# Patient Record
Sex: Female | Born: 1950
Health system: Southern US, Community
[De-identification: ages and names within clinical notes are randomized; demographics above are authoritative.]

## PROBLEM LIST (undated history)

## (undated) DIAGNOSIS — S92909A Unspecified fracture of unspecified foot, initial encounter for closed fracture: Secondary | ICD-10-CM

## (undated) DIAGNOSIS — C801 Malignant (primary) neoplasm, unspecified: Secondary | ICD-10-CM

## (undated) DIAGNOSIS — M810 Age-related osteoporosis without current pathological fracture: Secondary | ICD-10-CM

## (undated) DIAGNOSIS — E039 Hypothyroidism, unspecified: Secondary | ICD-10-CM

## (undated) DIAGNOSIS — I10 Essential (primary) hypertension: Secondary | ICD-10-CM

## (undated) DIAGNOSIS — R569 Unspecified convulsions: Secondary | ICD-10-CM

## (undated) HISTORY — DX: Unspecified convulsions: R56.9

## (undated) HISTORY — DX: Age-related osteoporosis without current pathological fracture: M81.0

## (undated) HISTORY — DX: Hypothyroidism, unspecified: E03.9

## (undated) HISTORY — DX: Essential (primary) hypertension: I10

## (undated) HISTORY — DX: Malignant (primary) neoplasm, unspecified: C80.1

## (undated) HISTORY — DX: Unspecified fracture of unspecified foot, initial encounter for closed fracture: S92.909A

---

## 1983-07-18 HISTORY — PX: ANKLE SURGERY: SHX546

## 1993-07-17 HISTORY — PX: TUBAL LIGATION: SHX77

## 1999-02-20 ENCOUNTER — Emergency Department (HOSPITAL_COMMUNITY): Admission: EM | Admit: 1999-02-20 | Discharge: 1999-02-20 | Payer: Self-pay | Admitting: Emergency Medicine

## 2001-07-17 HISTORY — PX: COLON SURGERY: SHX602

## 2001-10-28 ENCOUNTER — Inpatient Hospital Stay (HOSPITAL_COMMUNITY): Admission: AD | Admit: 2001-10-28 | Discharge: 2001-11-04 | Payer: Self-pay | Admitting: Internal Medicine

## 2001-10-28 ENCOUNTER — Encounter (INDEPENDENT_AMBULATORY_CARE_PROVIDER_SITE_OTHER): Payer: Self-pay | Admitting: Specialist

## 2001-12-04 ENCOUNTER — Ambulatory Visit (HOSPITAL_COMMUNITY): Admission: RE | Admit: 2001-12-04 | Discharge: 2001-12-04 | Payer: Self-pay | Admitting: Oncology

## 2001-12-04 ENCOUNTER — Encounter: Payer: Self-pay | Admitting: Oncology

## 2001-12-05 ENCOUNTER — Ambulatory Visit (HOSPITAL_BASED_OUTPATIENT_CLINIC_OR_DEPARTMENT_OTHER): Admission: RE | Admit: 2001-12-05 | Discharge: 2001-12-05 | Payer: Self-pay | Admitting: General Surgery

## 2001-12-05 ENCOUNTER — Encounter: Payer: Self-pay | Admitting: General Surgery

## 2002-01-06 ENCOUNTER — Ambulatory Visit (HOSPITAL_COMMUNITY): Admission: RE | Admit: 2002-01-06 | Discharge: 2002-01-06 | Payer: Self-pay | Admitting: Oncology

## 2002-06-23 ENCOUNTER — Encounter: Admission: RE | Admit: 2002-06-23 | Discharge: 2002-06-23 | Payer: Self-pay | Admitting: Oncology

## 2002-06-23 ENCOUNTER — Encounter: Payer: Self-pay | Admitting: Oncology

## 2002-07-03 ENCOUNTER — Ambulatory Visit (HOSPITAL_BASED_OUTPATIENT_CLINIC_OR_DEPARTMENT_OTHER): Admission: RE | Admit: 2002-07-03 | Discharge: 2002-07-03 | Payer: Self-pay | Admitting: General Surgery

## 2002-11-07 ENCOUNTER — Ambulatory Visit (HOSPITAL_COMMUNITY): Admission: RE | Admit: 2002-11-07 | Discharge: 2002-11-07 | Payer: Self-pay | Admitting: Gastroenterology

## 2002-12-08 ENCOUNTER — Encounter: Payer: Self-pay | Admitting: Oncology

## 2002-12-08 ENCOUNTER — Encounter: Admission: RE | Admit: 2002-12-08 | Discharge: 2002-12-08 | Payer: Self-pay | Admitting: Oncology

## 2002-12-15 ENCOUNTER — Ambulatory Visit (HOSPITAL_COMMUNITY): Admission: RE | Admit: 2002-12-15 | Discharge: 2002-12-15 | Payer: Self-pay | Admitting: Oncology

## 2002-12-15 ENCOUNTER — Encounter: Payer: Self-pay | Admitting: Oncology

## 2003-04-05 ENCOUNTER — Encounter: Payer: Self-pay | Admitting: Emergency Medicine

## 2003-04-05 ENCOUNTER — Emergency Department (HOSPITAL_COMMUNITY): Admission: EM | Admit: 2003-04-05 | Discharge: 2003-04-05 | Payer: Self-pay | Admitting: Emergency Medicine

## 2003-06-05 ENCOUNTER — Encounter: Admission: RE | Admit: 2003-06-05 | Discharge: 2003-06-05 | Payer: Self-pay | Admitting: Oncology

## 2003-06-05 IMAGING — CT CT ABDOMEN W/ CM
1 series · 15 of 32 positions shown, 19 images · IV contrast (G+ & 100 ML OMNI 300)
Comparison: none

CLINICAL DATA: Colon CA status post surgery in [DATE].  Chemotherapy completed [DATE].  Con none. 
PA AND LATERAL CHEST
Comparison [DATE] from [REDACTED].  There are lower lung volumes.  Even allowing for this, there is now mild cardiac enlargement.  There is no evidence of mediastinal adenopathy.  Vascular congestion is present without edema, confluent air space opacity or pleural effusion.  Osseous structures appear stable. 
IMPRESSION
Mild cardiomegaly and vascular congestion. 
TECHNIQUE
Multidetector   helical CT imaging was performed during the IV bolus injection of [60] of Omnipaque 300.  Oral contrast was given.  Comparison study [DATE] from [REDACTED]. 
CT ABDOMEN W/CONTRAST
The lung bases are clear.  There is no pleural effusion.  There is a small hiatal hernia.  The liver appears normal without suspicious lesions.  The spleen, pancreas, adrenal glands, and gallbladder appear normal.  The kidneys have stable appearances with low attenuation lesions bilaterally compatible with cysts.  There is one projecting posteriorly from the mid left kidney which has nonspecific soft tissue attenuation, but has not changed from the prior study and is probably a small complicated cyst.  There is no adenopathy, ascites, or omental disease. 
No evidence of local recurrence or metastatic disease in the abdomen.  
Stable low attenuation renal lesions bilaterally. 
CT PELVIS W/CONTRAST
Post operative changes in the pelvis appear stable.  Multiple sigmoid diverticula are present without surrounding inflammatory change or fluid collection.  Small lymph nodes inferiorly in the mesentery appear stable, and no enlarged pelvic lymph nodes are seen.  The uterus and adnexa appear unchanged. 
Stable post operative CT of the pelvis.  No evidence for local recurrence or metastatic disease.

[Series 2: — · axial · 0.64mm/px · z∈[-309,-9]mm · 15 of 98 slices shown, 19 images]
[im 7/98  soft-tissue]
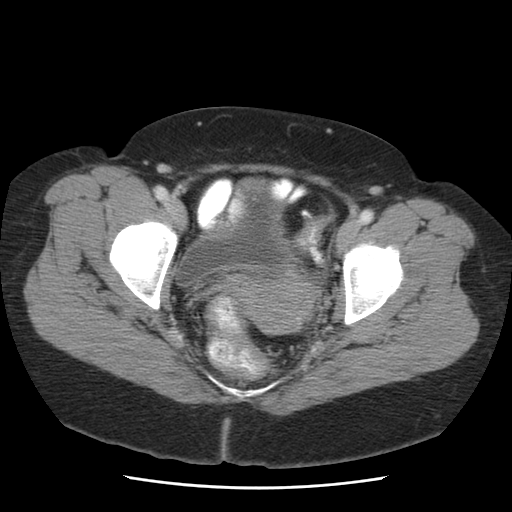
[im 7/98  bone]
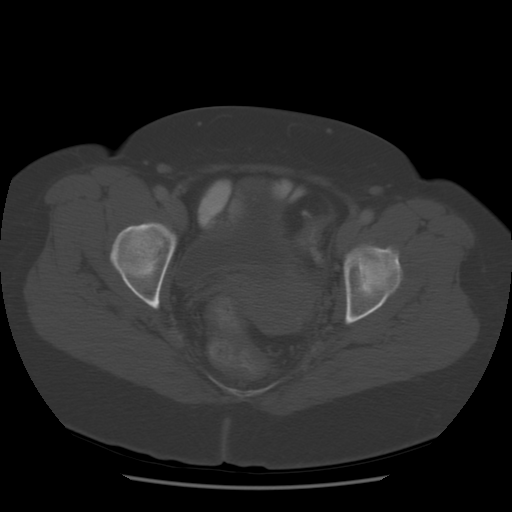
[im 13/98  soft-tissue]
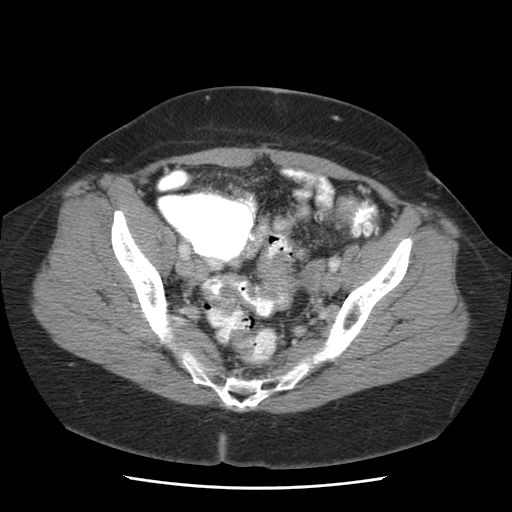
[im 19/98  soft-tissue]
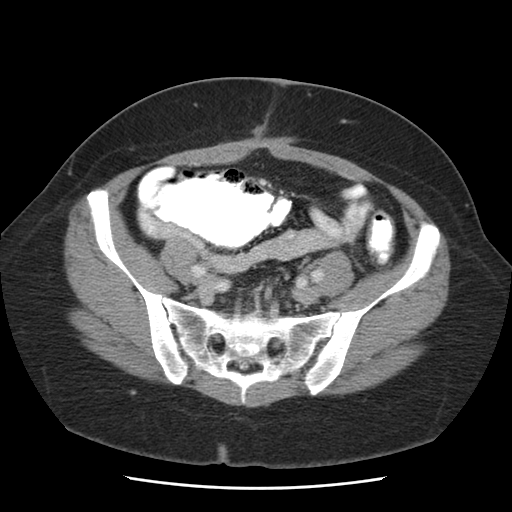
[im 29/98  soft-tissue]
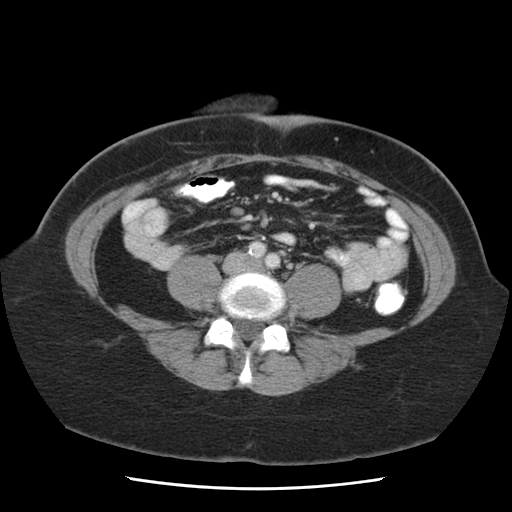
[im 35/98  soft-tissue]
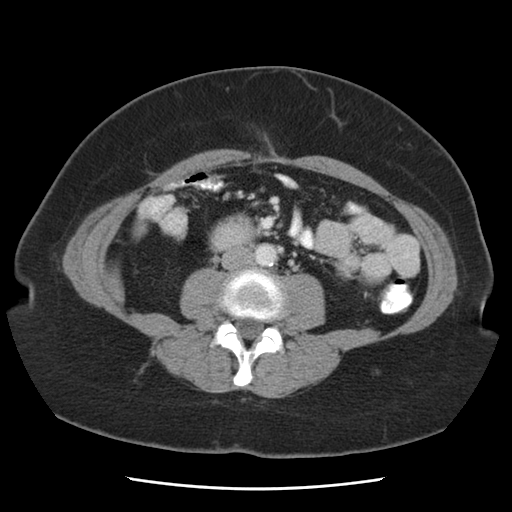
[im 41/98  soft-tissue]
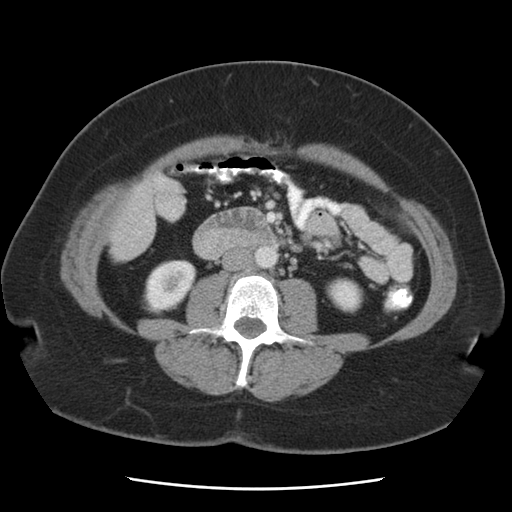
[im 51/98  soft-tissue]
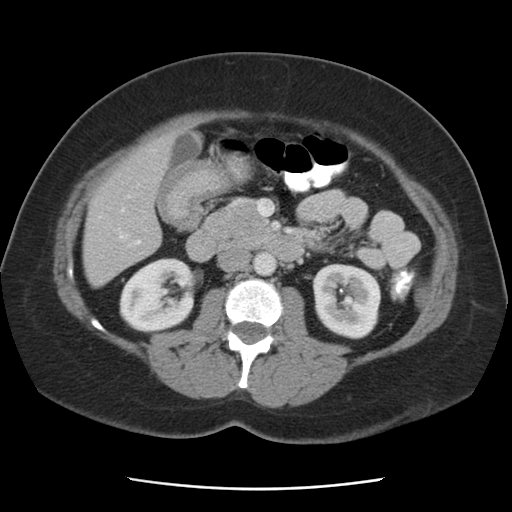
[im 57/98  soft-tissue]
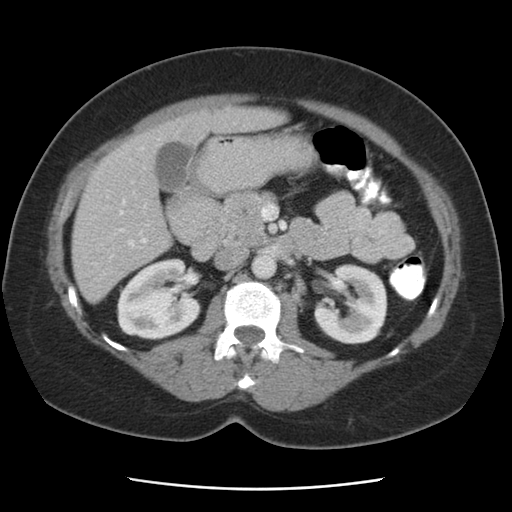
[im 63/98  soft-tissue]
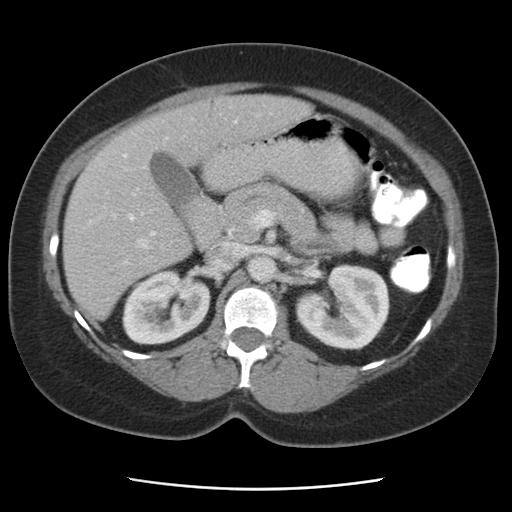
[im 63/98  bone]
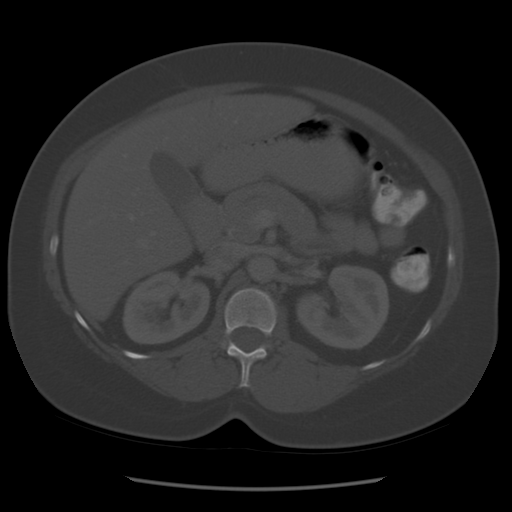
[im 69/98  soft-tissue]
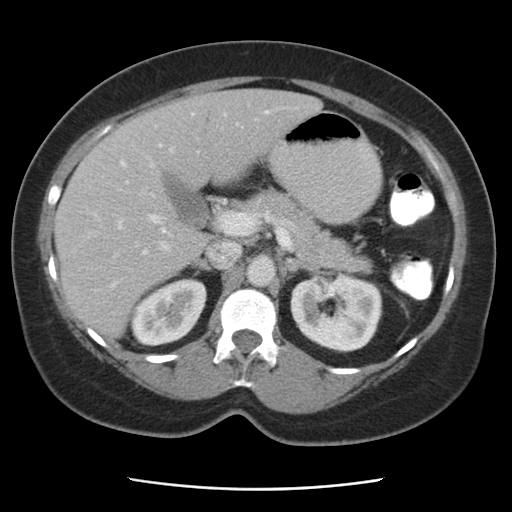
[im 79/98  soft-tissue]
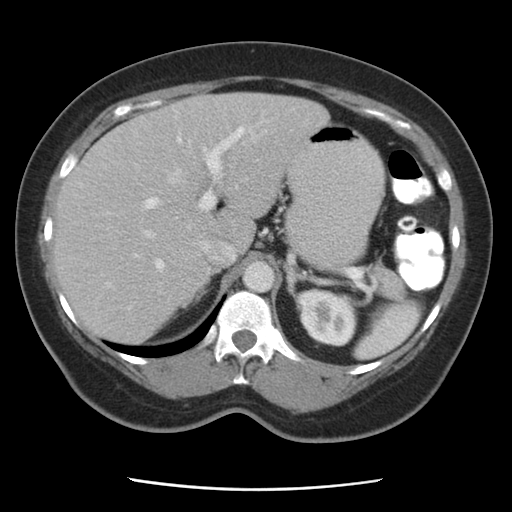
[im 85/98  soft-tissue]
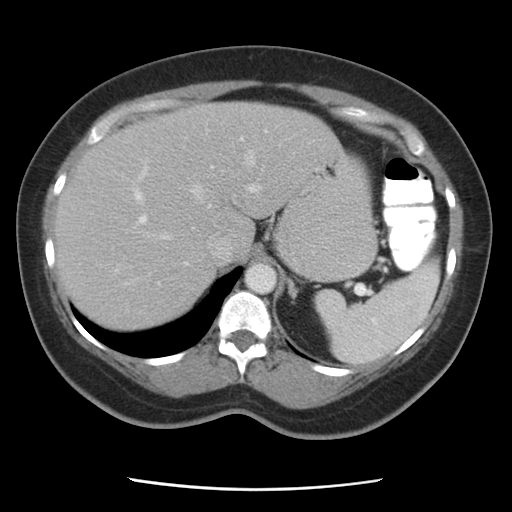
[im 85/98  lung]
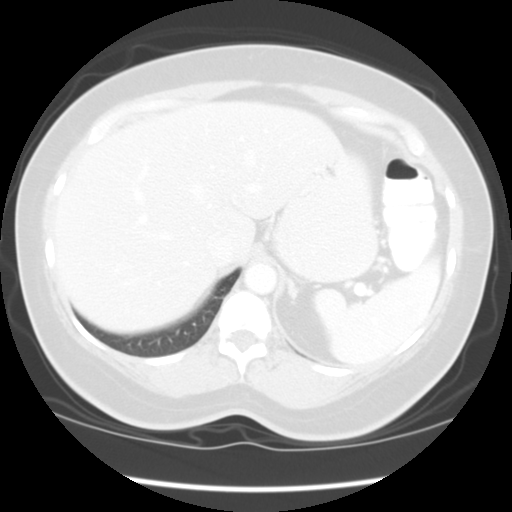
[im 88/98  lung]
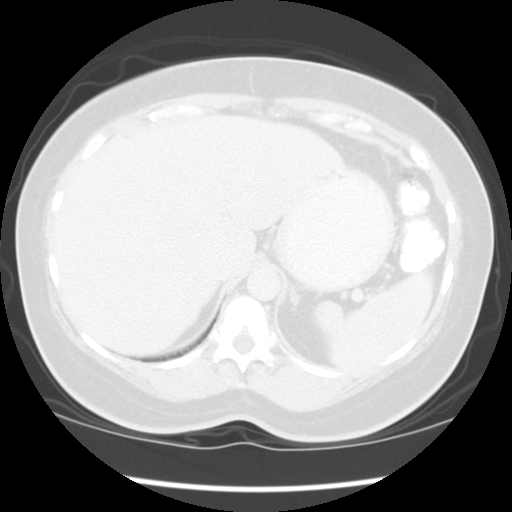
[im 91/98  soft-tissue]
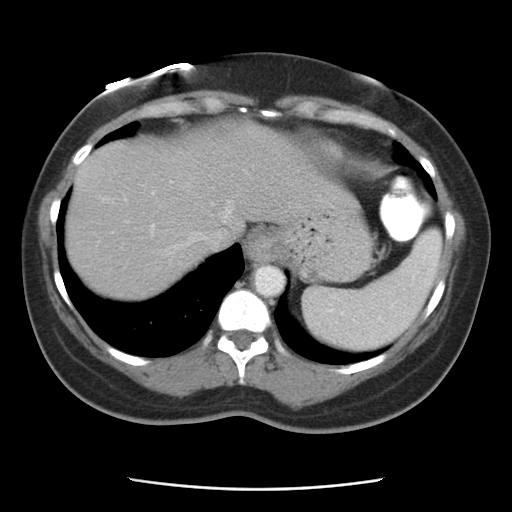
[im 91/98  lung]
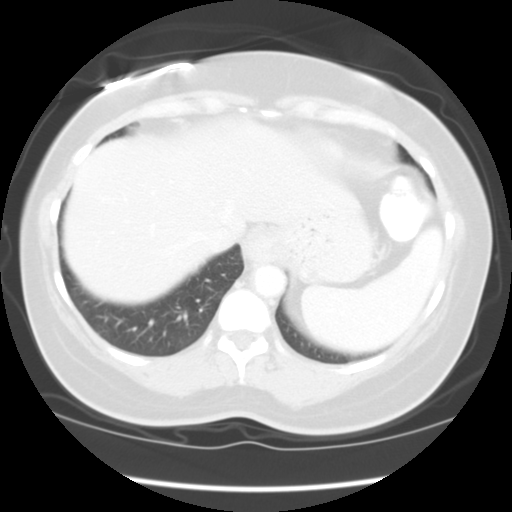
[im 94/98  lung]
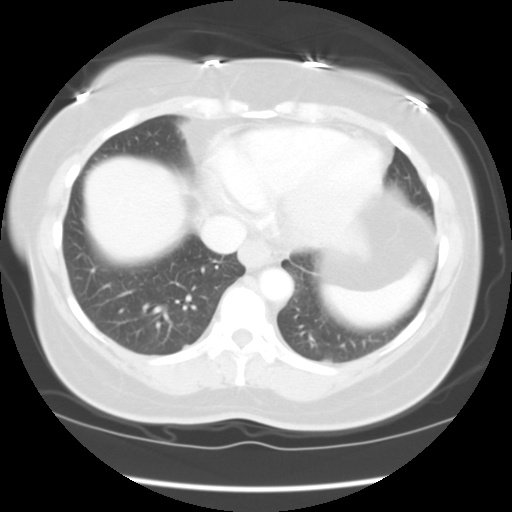

[15 of 32 positions shown; findings below may reference images not displayed]

## 2003-12-07 ENCOUNTER — Encounter: Admission: RE | Admit: 2003-12-07 | Discharge: 2003-12-07 | Payer: Self-pay | Admitting: Oncology

## 2003-12-07 IMAGING — CT CT ABDOMEN W/ CM
1 series · 16 of 32 positions shown, 20 images · IV contrast (G+ & 100 MLOMNI 300)
Comparison: none

CLINICAL DATA: Colon cancer ? restaging.
TECHNIQUE: Multidetector helical imaging carried out utilizing oral and IV contrast ? [FN] of Omnipaque 300. Comparison [DATE]. 
 CT ABDOMEN W/CONTRAST: 
 Lung bases clear.  Liver, spleen, pancreas, and adrenals remain normal.  Early and delayed images of the kidneys unremarkable except for a few scattered small cysts as before.  
 No adenopathy or ascites.

[Series 2: — · axial · 0.70mm/px · z∈[-296,+29]mm · 16 of 102 slices shown, 20 images]
[im 7/102  soft-tissue]
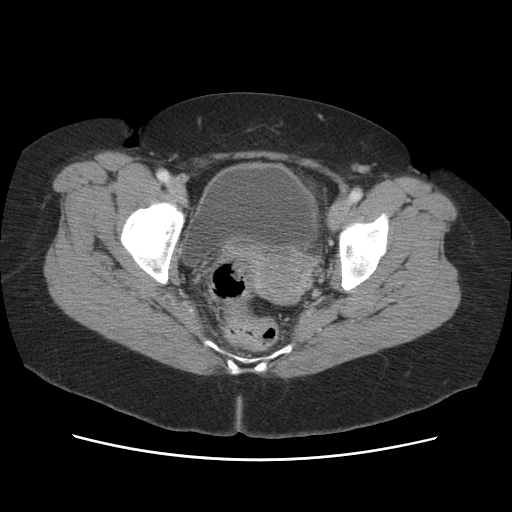
[im 7/102  bone]
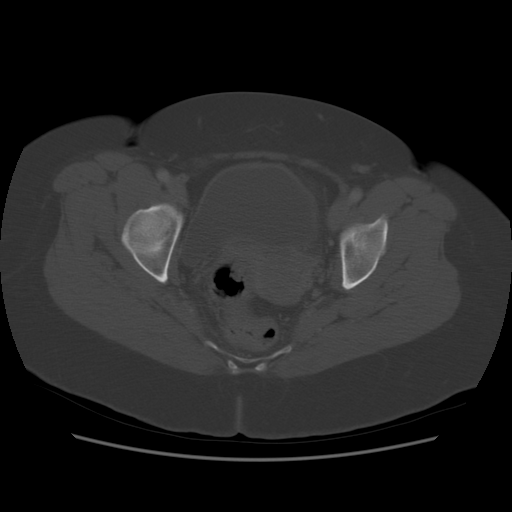
[im 14/102  soft-tissue]
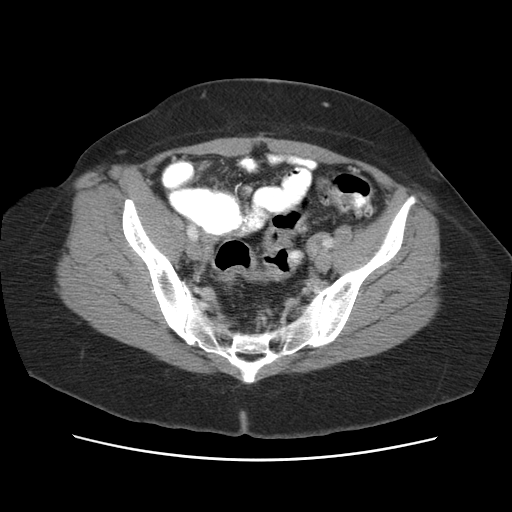
[im 20/102  soft-tissue]
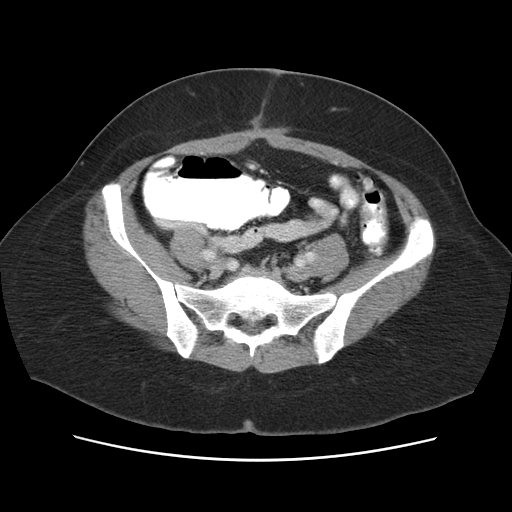
[im 27/102  soft-tissue]
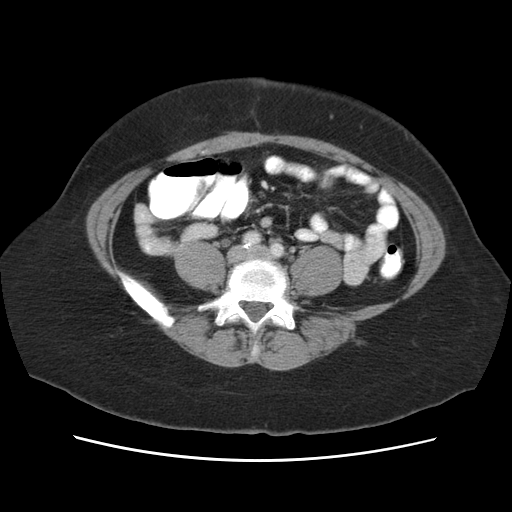
[im 33/102  soft-tissue]
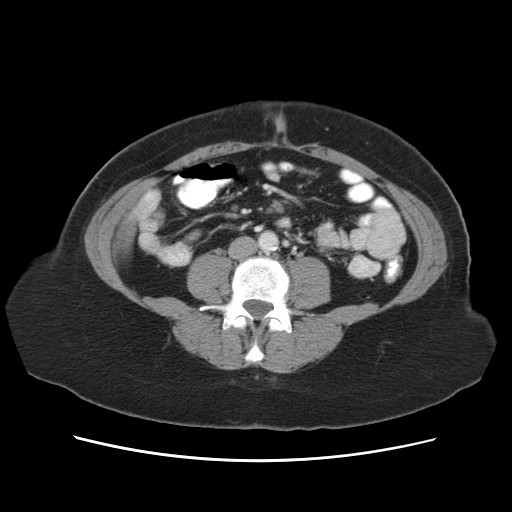
[im 40/102  soft-tissue]
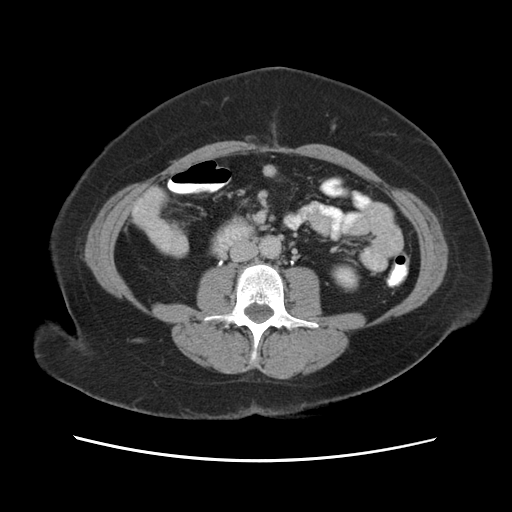
[im 46/102  soft-tissue]
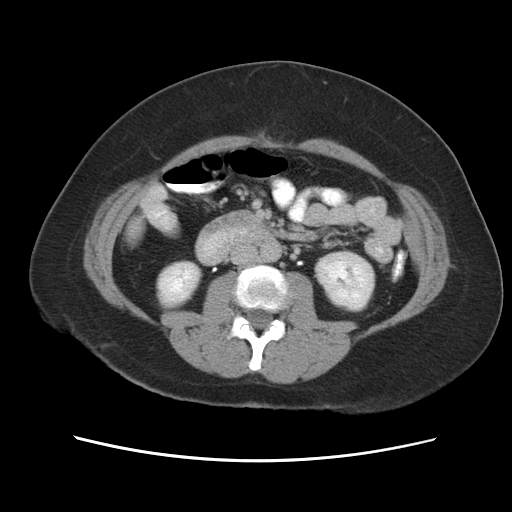
[im 56/102  soft-tissue]
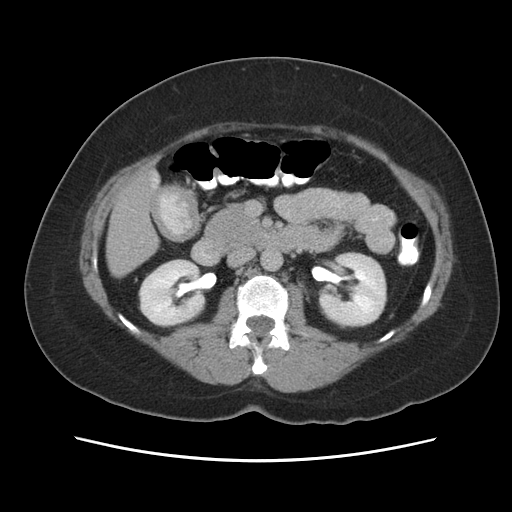
[im 62/102  soft-tissue]
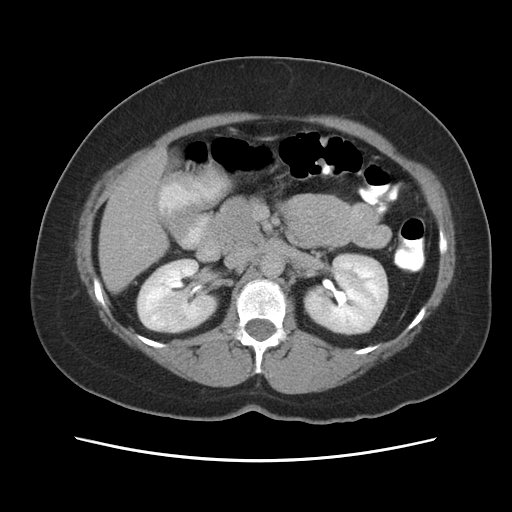
[im 62/102  bone]
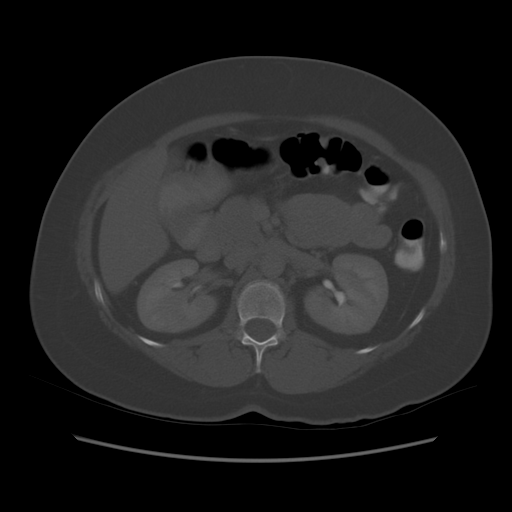
[im 69/102  soft-tissue]
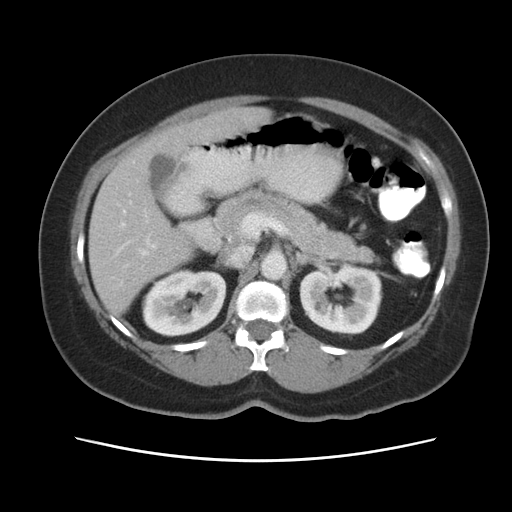
[im 75/102  soft-tissue]
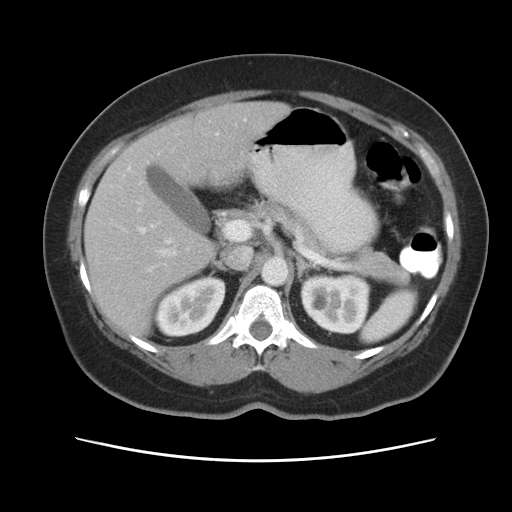
[im 82/102  soft-tissue]
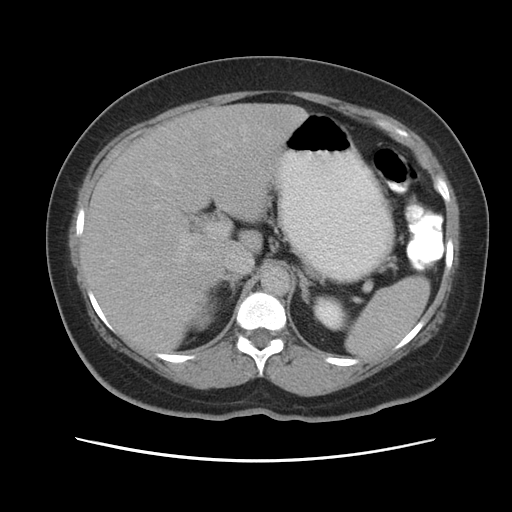
[im 88/102  soft-tissue]
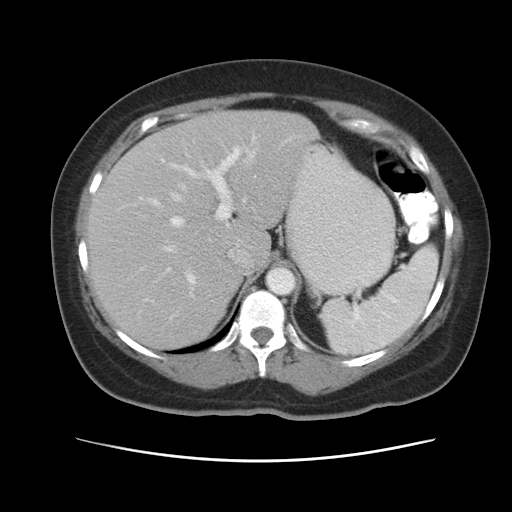
[im 88/102  lung]
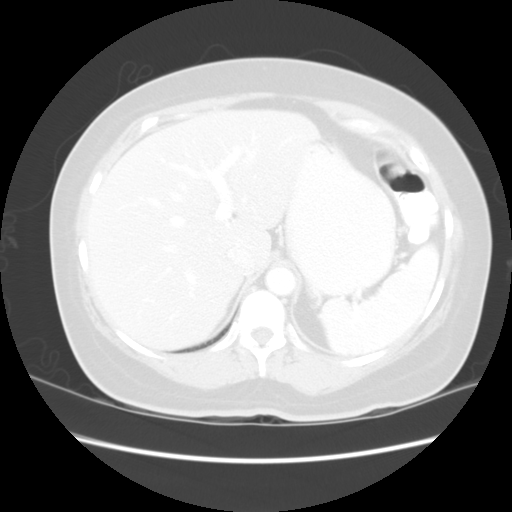
[im 92/102  lung]
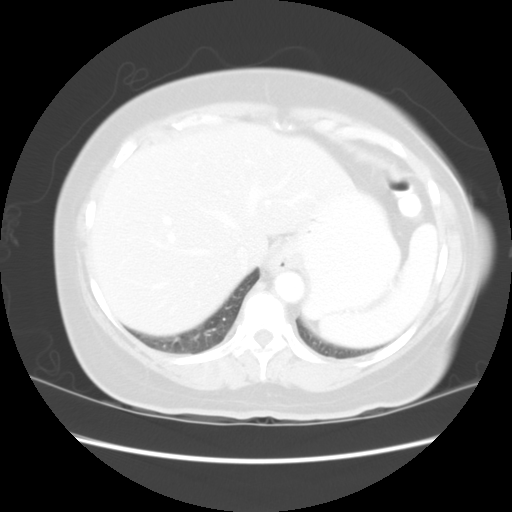
[im 95/102  soft-tissue]
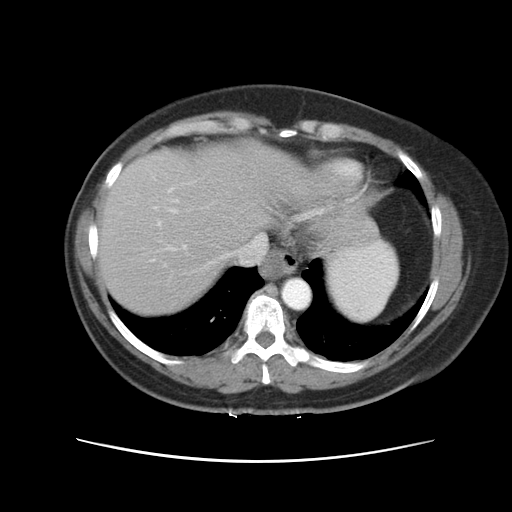
[im 95/102  lung]
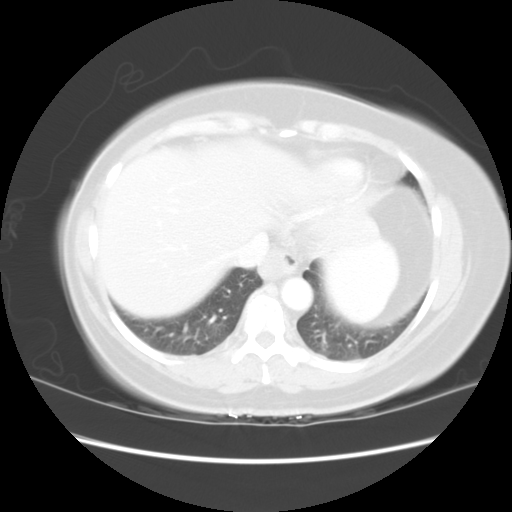
[im 98/102  lung]
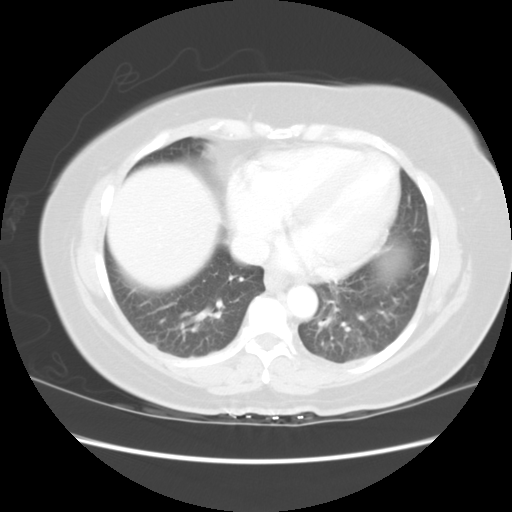

[16 of 32 positions shown; findings below may reference images not displayed]

IMPRESSION: No significant findings ? no change. 
 CT PELVIS W/CONTRAST: 
 No focal masses, adenopathy, or fluid collections.  Pelvic side walls and presacral space normally.  Colonic diverticula again noted.
IMPRESSION: No significant findings ? no change.

## 2003-12-08 ENCOUNTER — Encounter: Admission: RE | Admit: 2003-12-08 | Discharge: 2003-12-08 | Payer: Self-pay | Admitting: Oncology

## 2004-05-30 ENCOUNTER — Ambulatory Visit: Payer: Self-pay | Admitting: Oncology

## 2004-05-31 ENCOUNTER — Encounter: Admission: RE | Admit: 2004-05-31 | Discharge: 2004-05-31 | Payer: Self-pay | Admitting: Oncology

## 2004-05-31 IMAGING — CR DG CHEST 2V
2 series · 2 of 2 positions shown · non-contrast
Comparison: none

CLINICAL DATA: Colon cancer restaging. 
 CHEST, TWO VIEWS: 
 Comparison [DATE]. 
 Heart size is upper limits of normal.  No focal air space opacities or effusions.  Visualized skeleton unremarkable.

[view not recorded (1 of 2)]
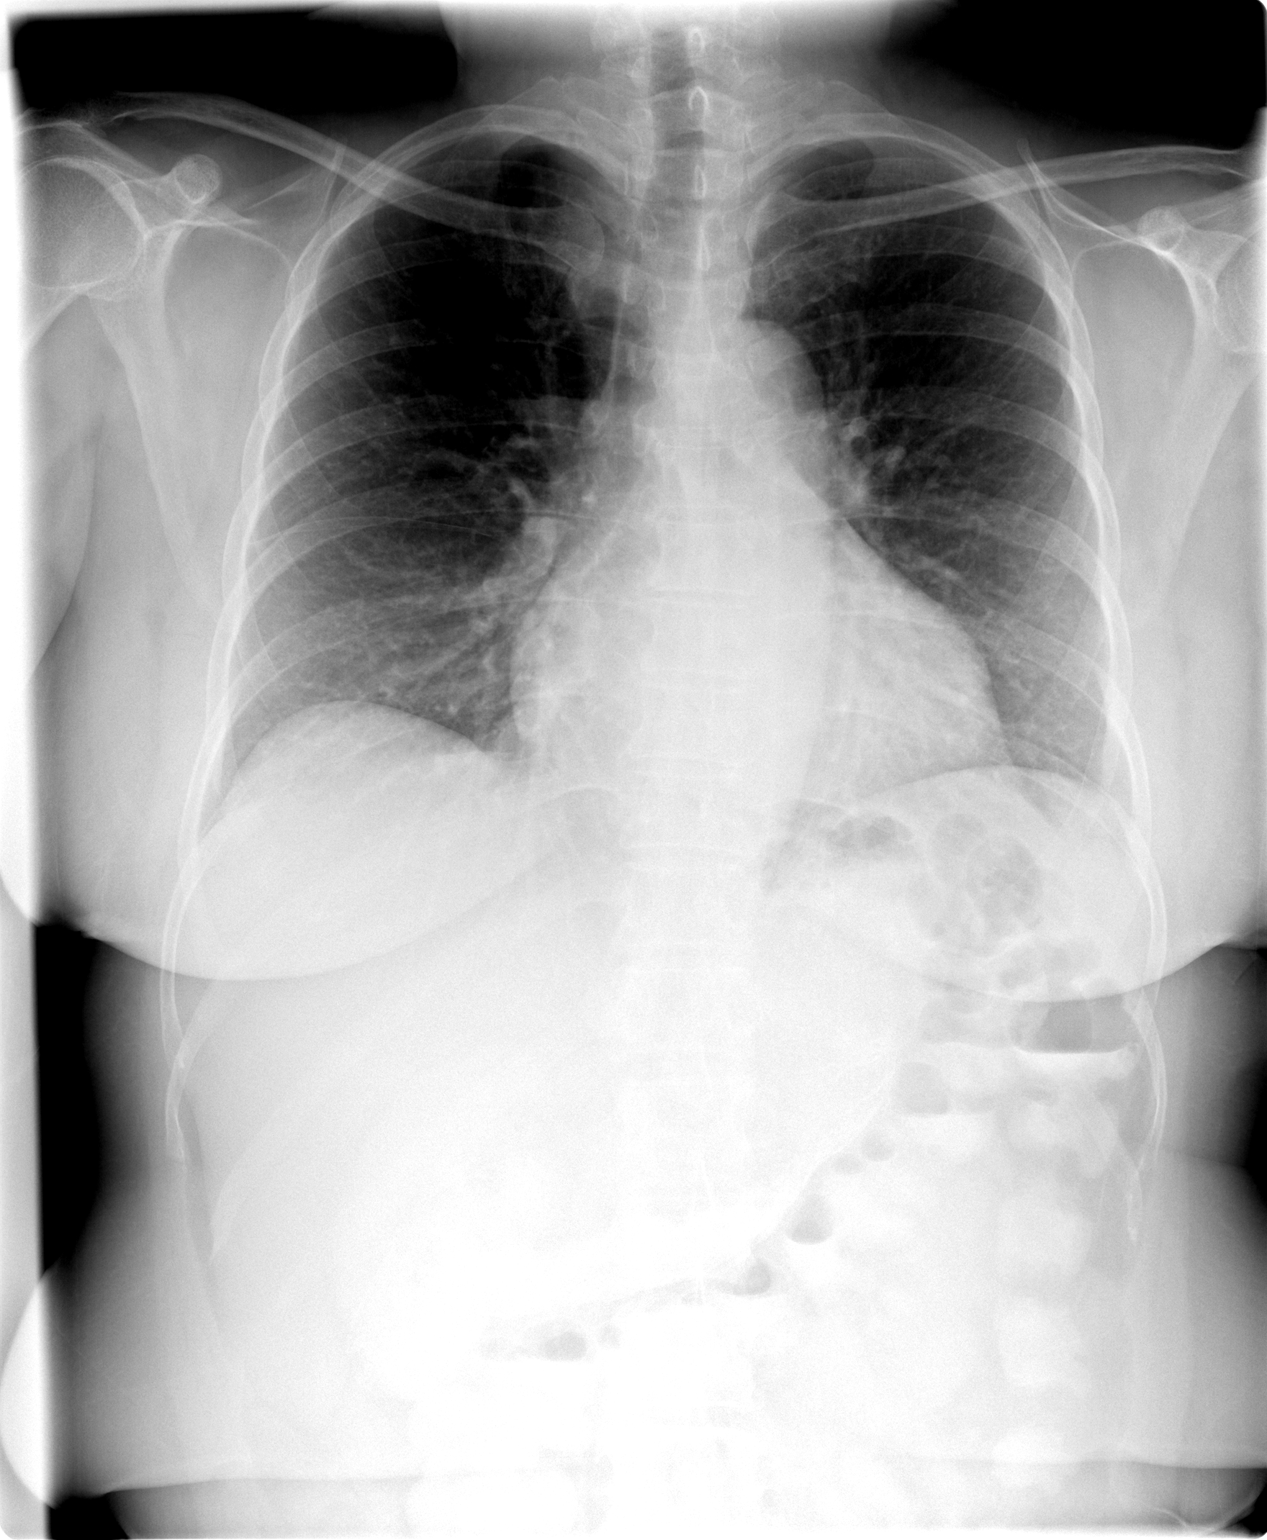

[view not recorded (2 of 2)]
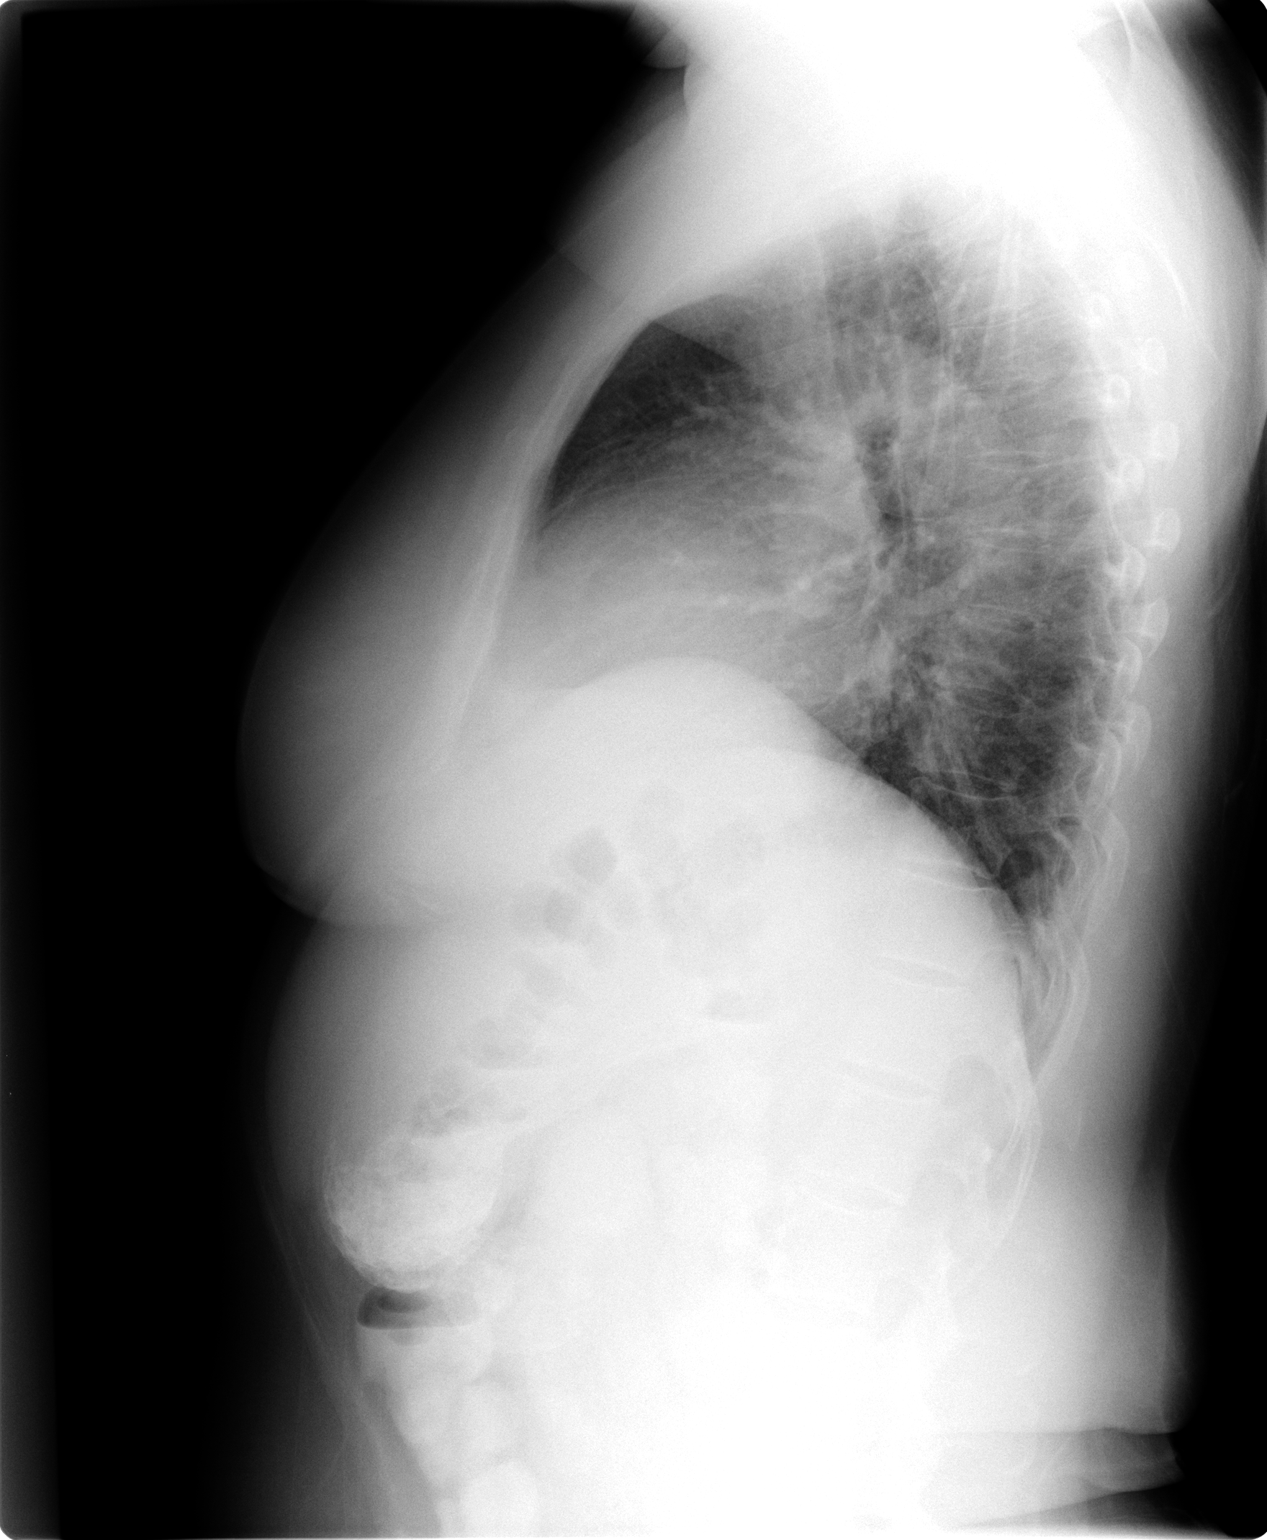

[2 of 2 positions shown; findings below may reference images not displayed]

IMPRESSION: No acute findings.  No active disease.

## 2004-05-31 IMAGING — CT CT ABDOMEN W/ CM
1 of 2 series · 15 of 32 positions shown, 19 images · IV contrast (GASTROGRAFIN & [ID] OMNI 300)
Comparison: none

CLINICAL DATA: Restaging colon cancer, post partial colectomy.  Tubal pregnancy surgery.  Chemotherapy.
 ABDOMINAL CT WITH CONTRAST ? [DATE]:
 Routine spiral CT of the abdomen was performed.  100 cc Omnipaque 300 intravenous contrast and oral contrast were administered.  In comparison with previous abdominal CT of [DATE], there is no interval change in small bilateral renal cysts and incidental small hiatus hernia.

 The abdominal parenchymal organs are normal in appearance. There is no evidence of masses, adenopathy, inflammatory process, or abnormal fluid collections.

[Series 2: a&p w/ · axial · 0.64mm/px · z∈[-393,-33]mm · 15 of 80 slices shown, 19 images]
[im 4/80  soft-tissue]
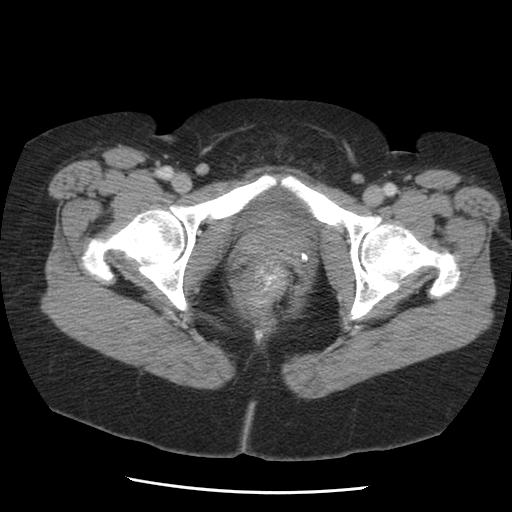
[im 4/80  bone]
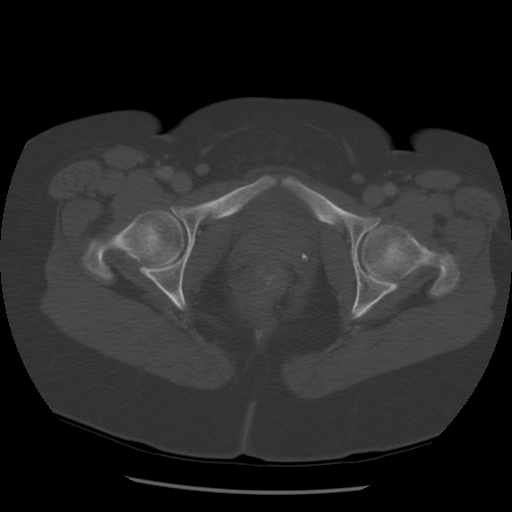
[im 11/80  soft-tissue]
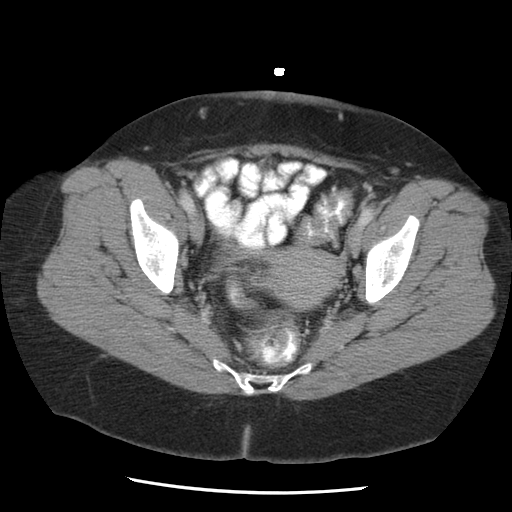
[im 18/80  soft-tissue]
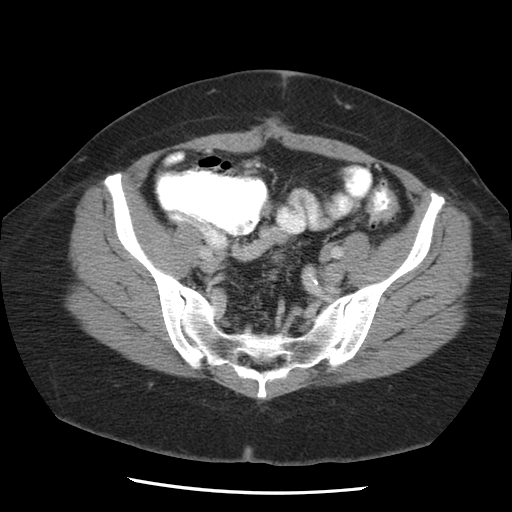
[im 21/80  soft-tissue]
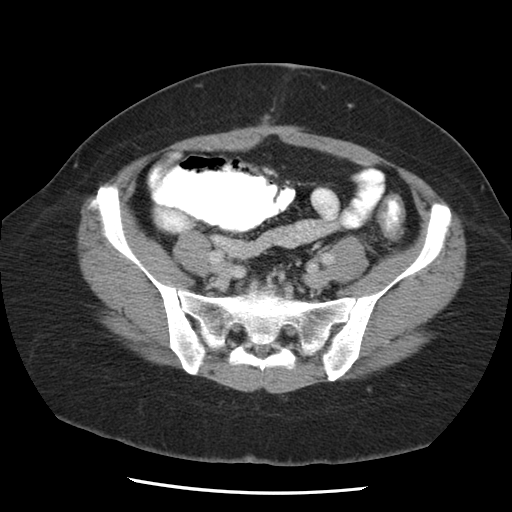
[im 28/80  soft-tissue]
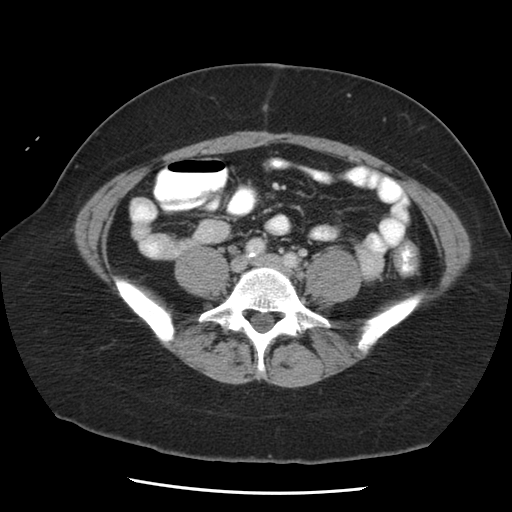
[im 35/80  soft-tissue]
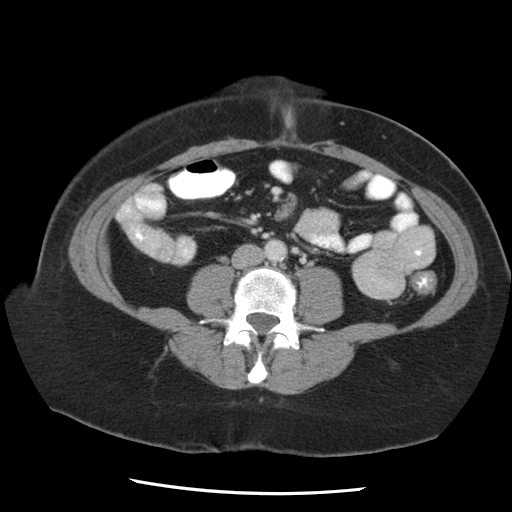
[im 42/80  soft-tissue]
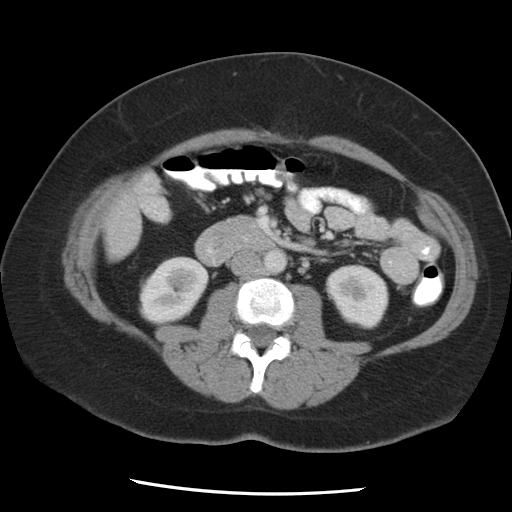
[im 45/80  soft-tissue]
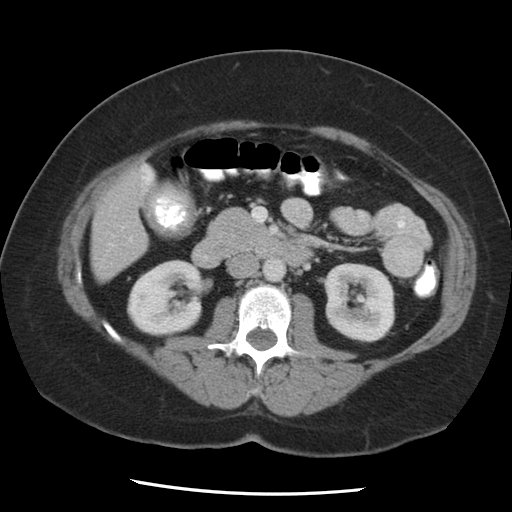
[im 52/80  soft-tissue]
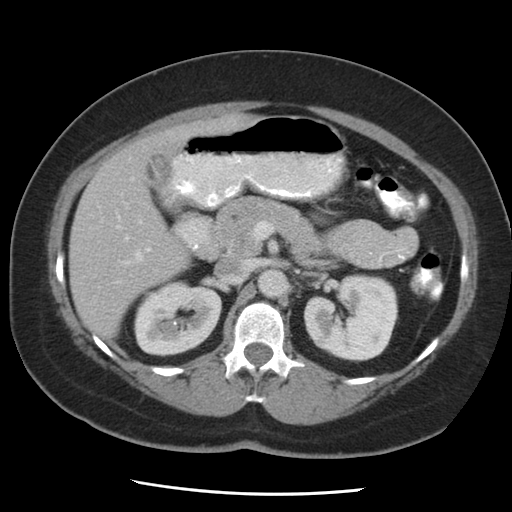
[im 52/80  bone]
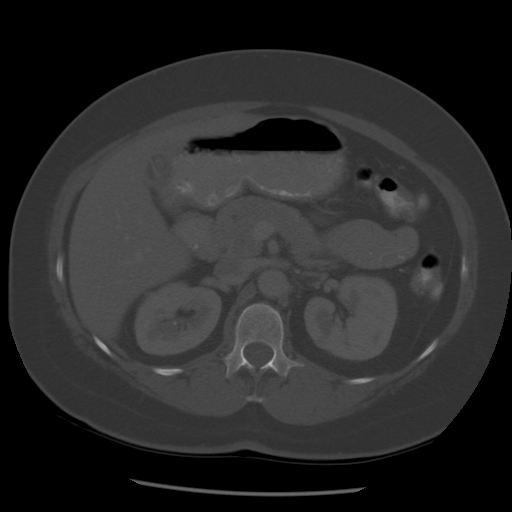
[im 59/80  soft-tissue]
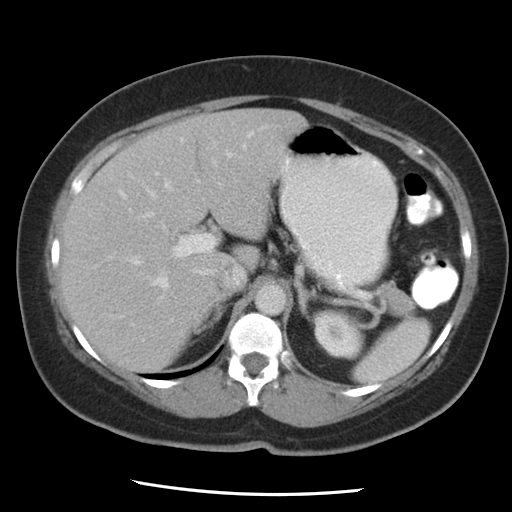
[im 62/80  soft-tissue]
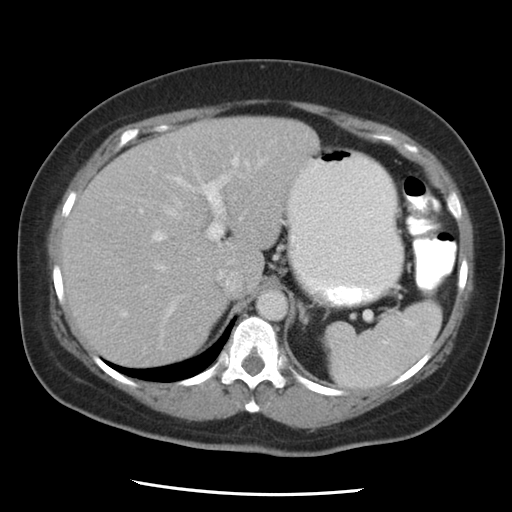
[im 66/80  lung]
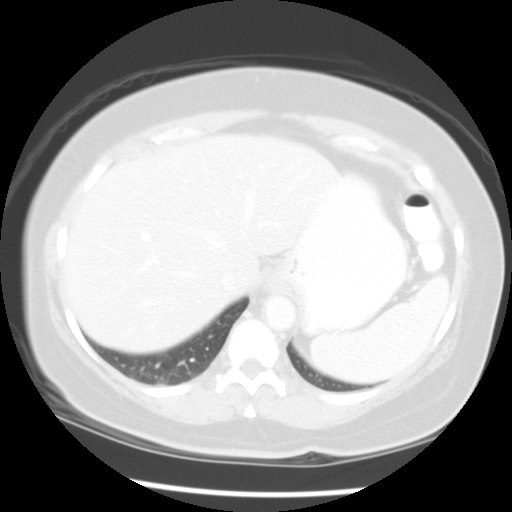
[im 69/80  soft-tissue]
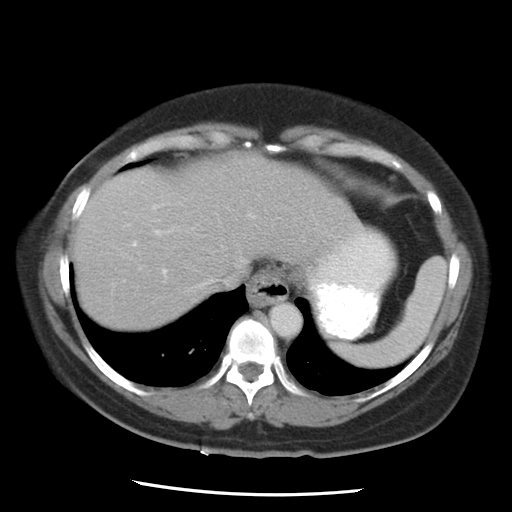
[im 69/80  lung]
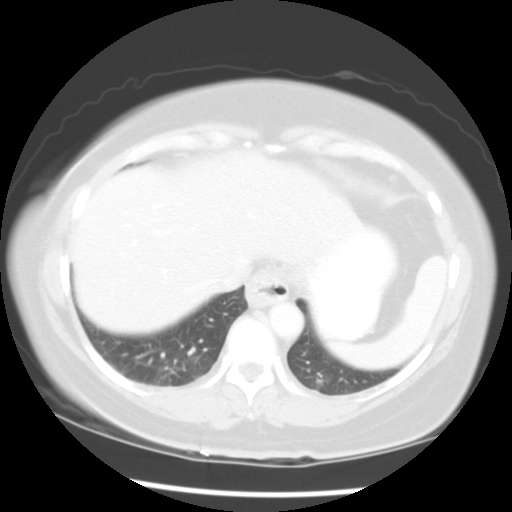
[im 73/80  lung]
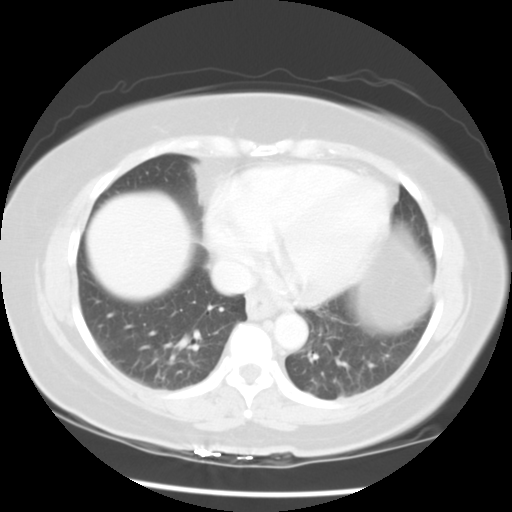
[im 76/80  soft-tissue]
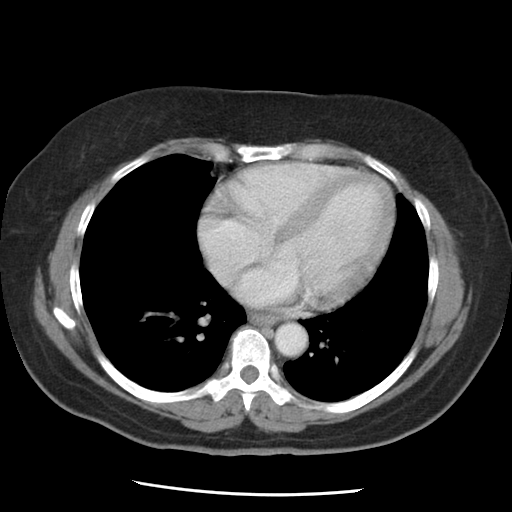
[im 76/80  lung]
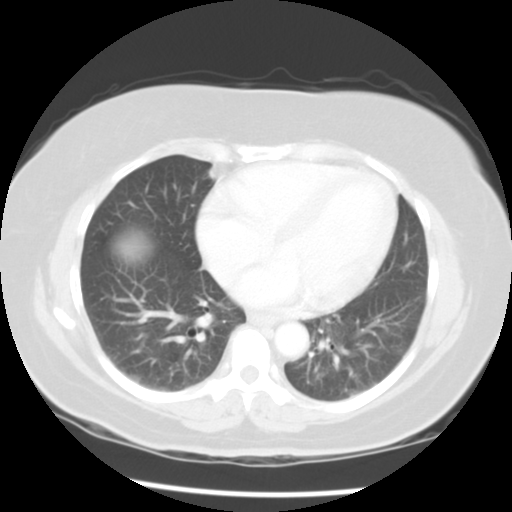

[15 of 32 positions shown; findings below may reference images not displayed]

IMPRESSION: Since [DATE], no interval change:
 1.  No evidence for locally recurrent nor metastatic colon cancer.
 2.  Incidental bilateral renal cysts and small hiatus hernia.
 3.  Otherwise negative.
 PELVIC CT WITH CONTRAST ? [DATE]:
 Routine spiral CT of the pelvis was performed. Omnipaque intravenous contrast and oral contrast were administered. 

 The pelvic structures are normal in appearance. There is no evidence of masses, adenopathy, inflammatory process, or abnormal fluid collections.   There is no interval change in slight descending and sigmoid diverticulosis without interval inflammatory change.  Comparison is made with prior pelvic CT of [DATE].
IMPRESSION: Since [DATE], no interval change:
 1. Stable slight descending sigmoid diverticulosis.
 2.  Otherwise negative ? no locally recurrent nor metastatic colon cancer identified.

## 2004-10-27 IMAGING — CR DG CHEST 2V
2 series · 2 of 2 positions shown · non-contrast
Comparison: none

CLINICAL DATA: Colon carcinoma follow up.
 CHEST X-RAY:
 Two views of the chest are compared to a chest x-ray of [DATE].  Mild cardiomegaly is present.  There is question of minimal congestion.  No lung nodule is seen, and no effusion is noted.  No acute bony abnormality is seen.

[view not recorded (1 of 2)]
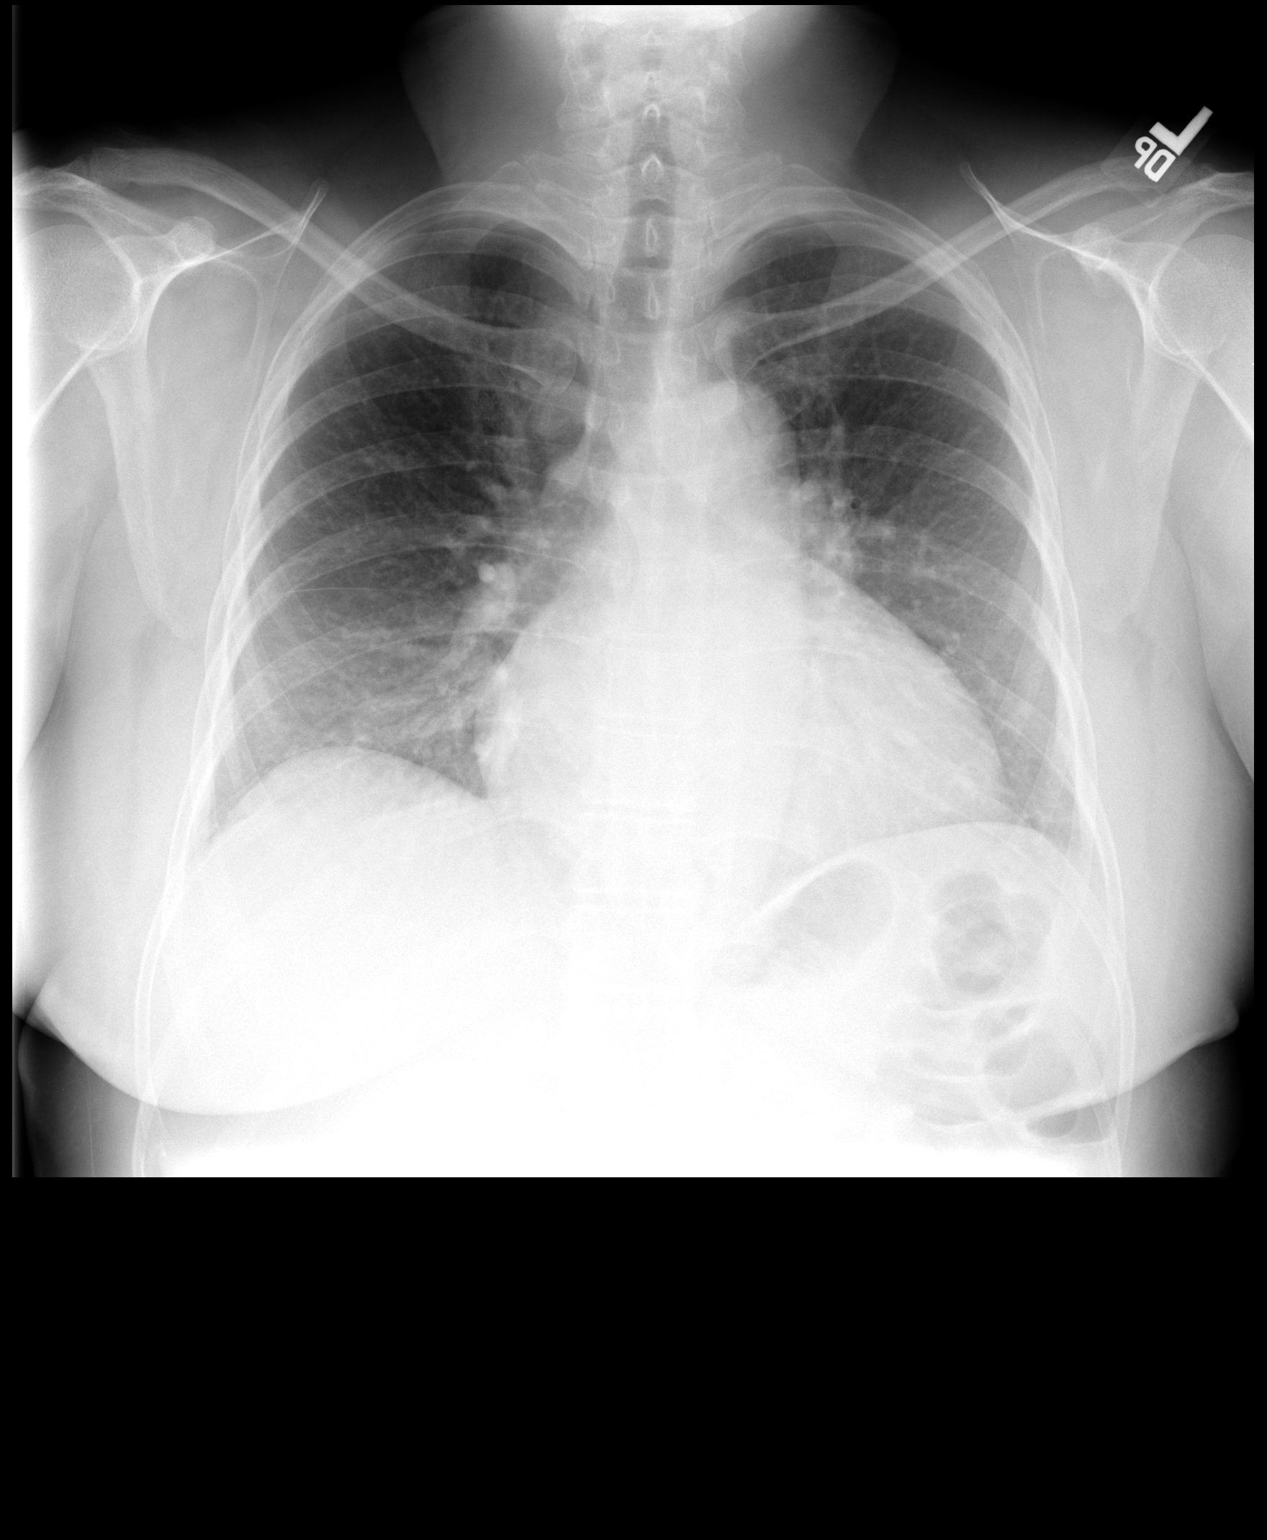

[view not recorded (2 of 2)]
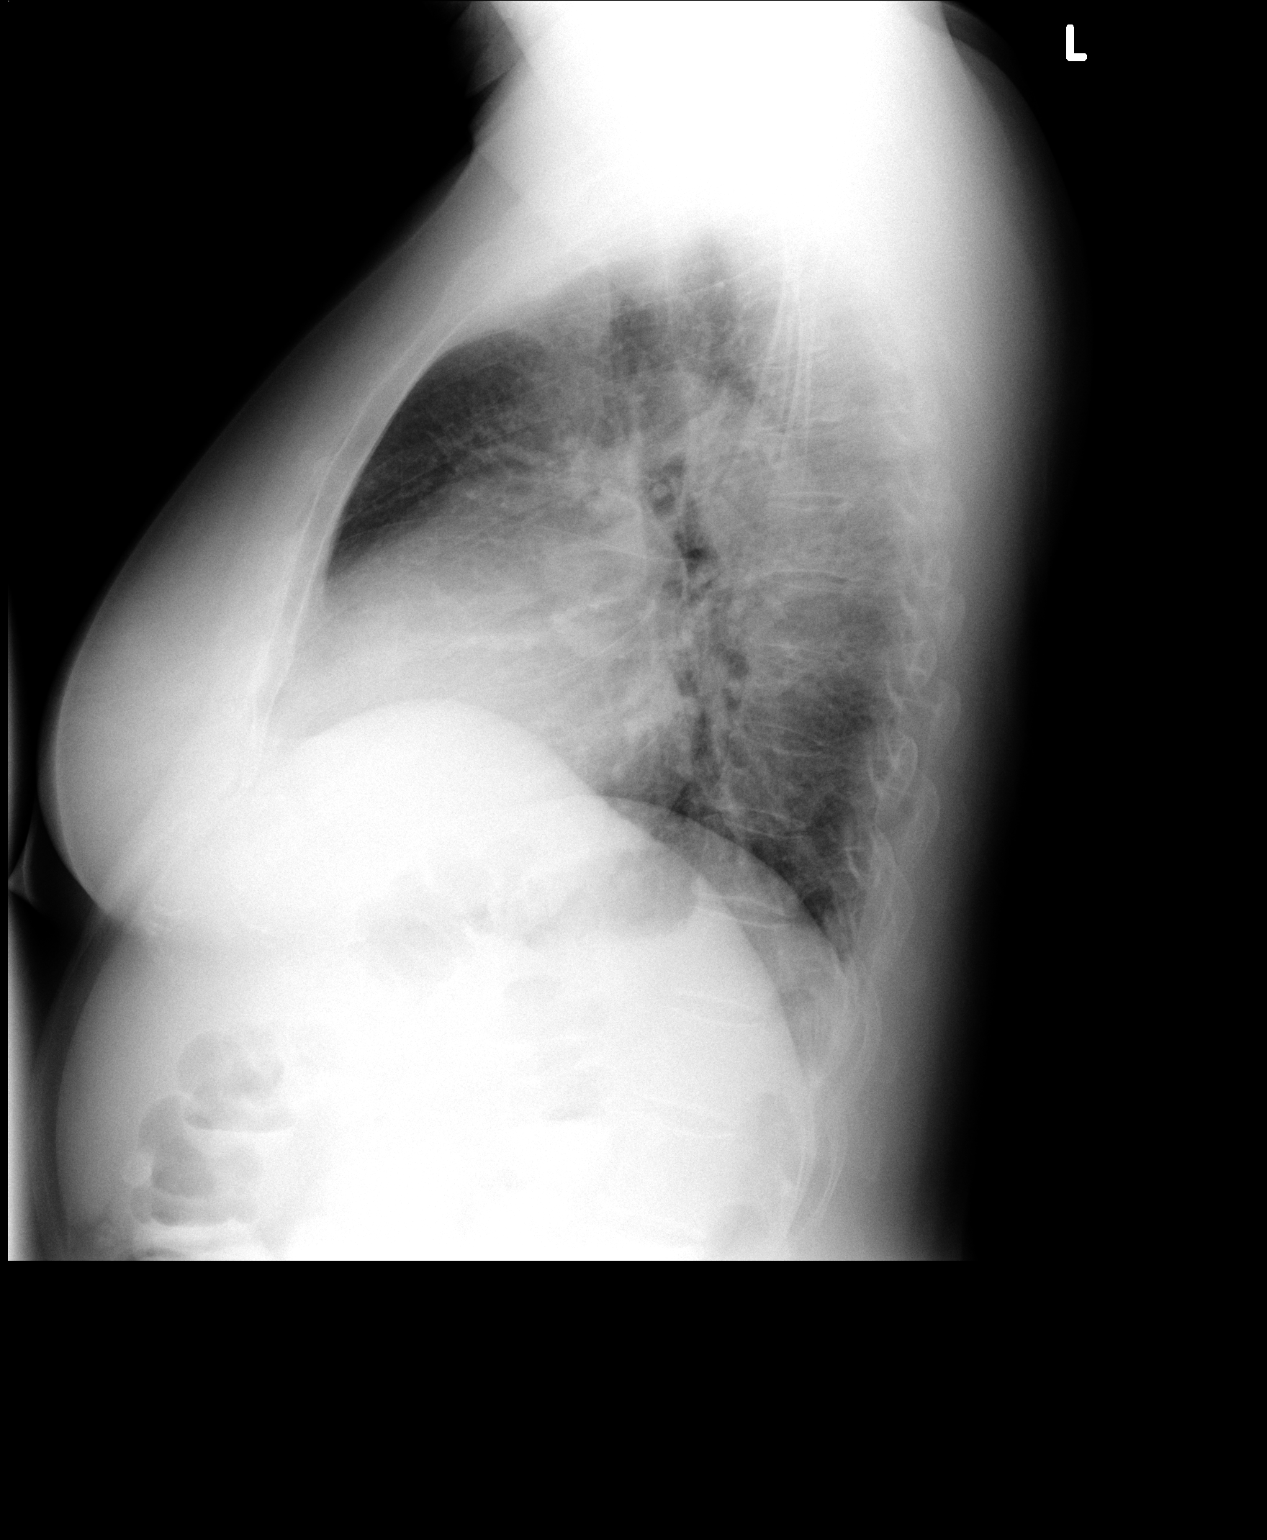

[2 of 2 positions shown; findings below may reference images not displayed]

IMPRESSION: Cardiomegaly.  Question mild congestion.  No lung lesion is seen.

## 2004-11-28 ENCOUNTER — Ambulatory Visit: Payer: Self-pay | Admitting: Oncology

## 2004-11-30 ENCOUNTER — Encounter: Admission: RE | Admit: 2004-11-30 | Discharge: 2004-11-30 | Payer: Self-pay | Admitting: Oncology

## 2004-11-30 IMAGING — CT CT ABDOMEN W/ CM
1 of 3 series · 14 of 32 positions shown, 19 images · IV contrast (GASTRO. & [ID] OMNI 300)
Comparison: [DATE]

ABDOMEN CT WITH CONTRAST

CLINICAL DATA: Colon cancer
TECHNIQUE: Multidetector CT imaging of the abdomen and pelvis was performed
following the standard protocol during bolus administration of intravenous
contrast.

Contrast:  100 cc Omnipaque 300

[Series 2: — · axial · 0.70mm/px · z∈[-278,+66]mm · 14 of 79 slices shown, 19 images]
[im 5/79  soft-tissue]
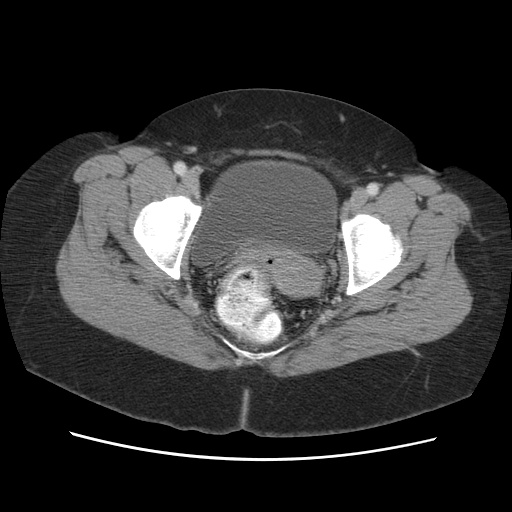
[im 5/79  bone]
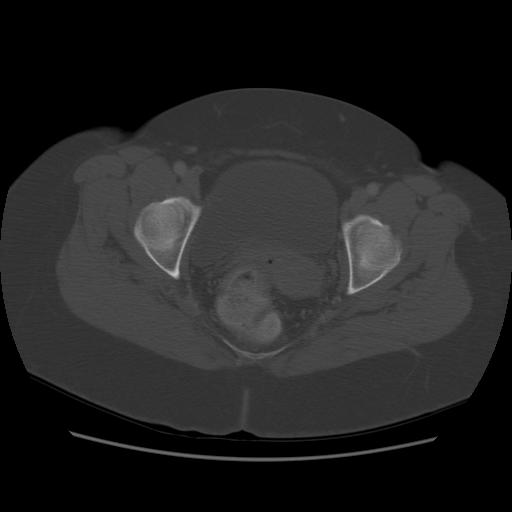
[im 9/79  soft-tissue]
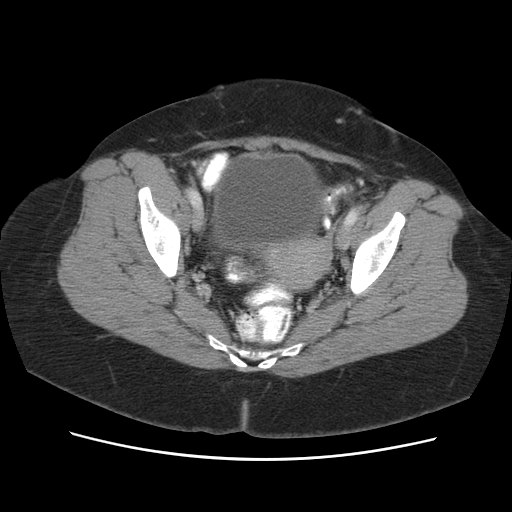
[im 18/79  soft-tissue]
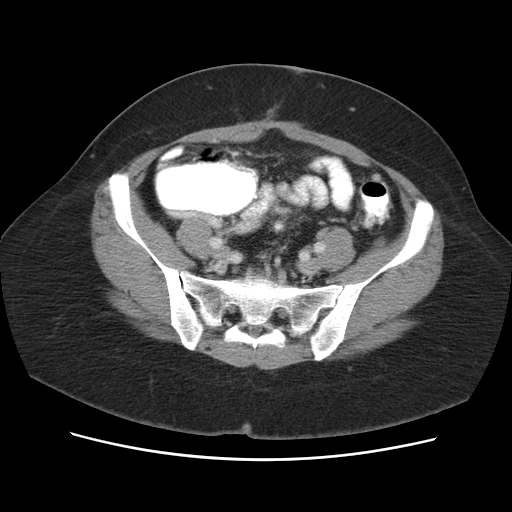
[im 22/79  soft-tissue]
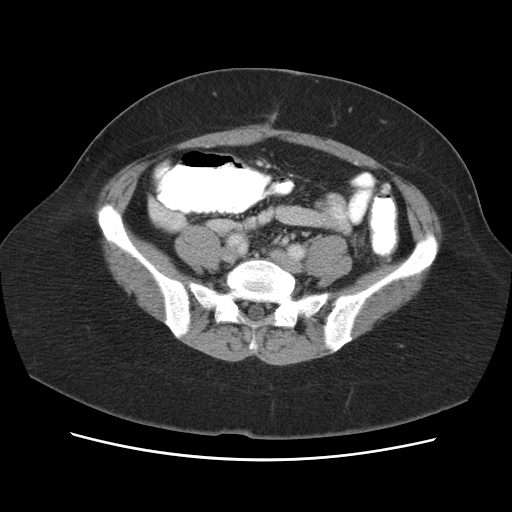
[im 27/79  soft-tissue]
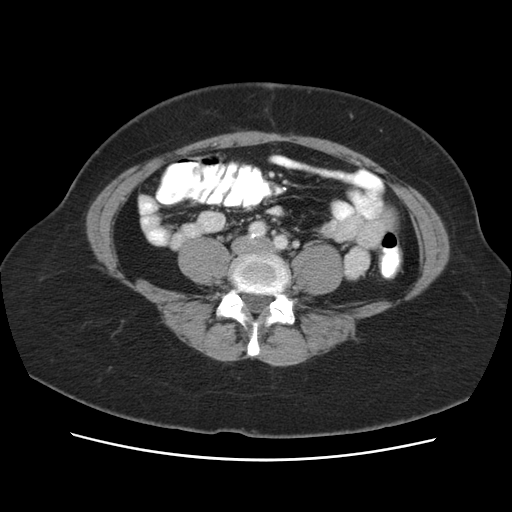
[im 35/79  soft-tissue]
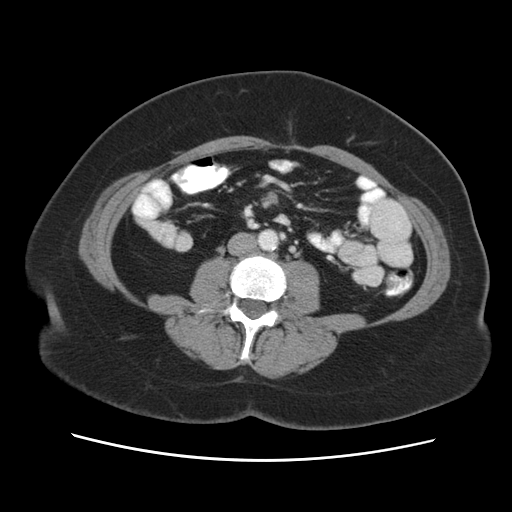
[im 40/79  soft-tissue]
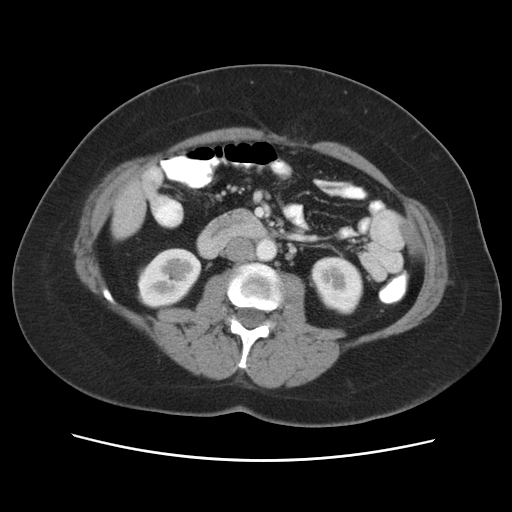
[im 44/79  soft-tissue]
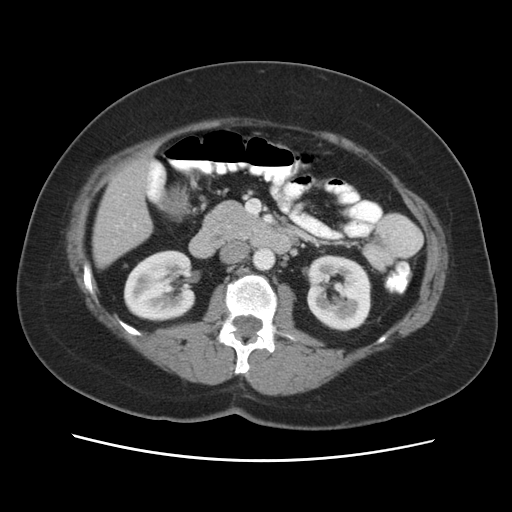
[im 53/79  soft-tissue]
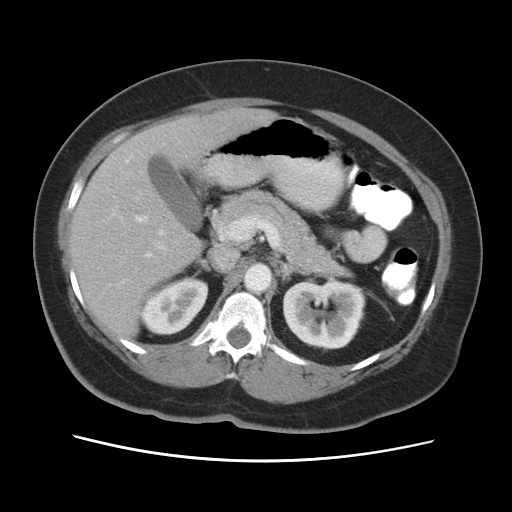
[im 53/79  bone]
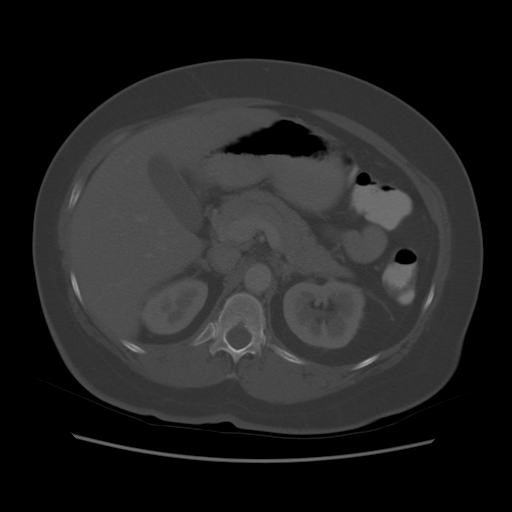
[im 57/79  soft-tissue]
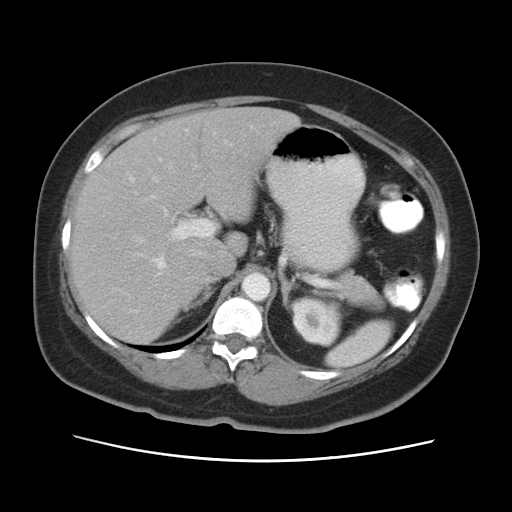
[im 61/79  soft-tissue]
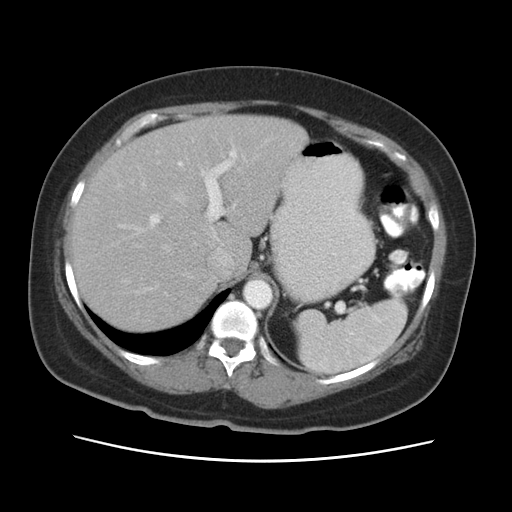
[im 61/79  lung]
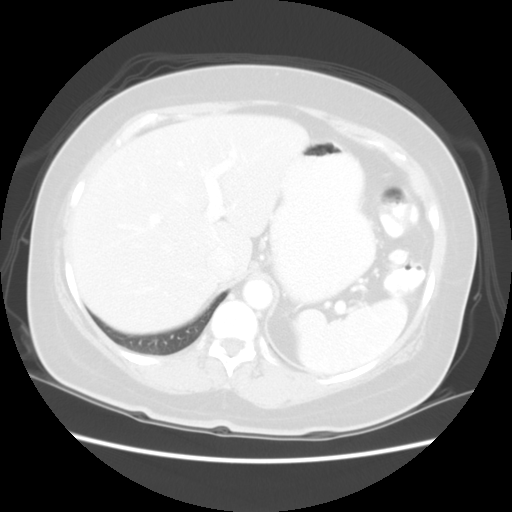
[im 66/79  lung]
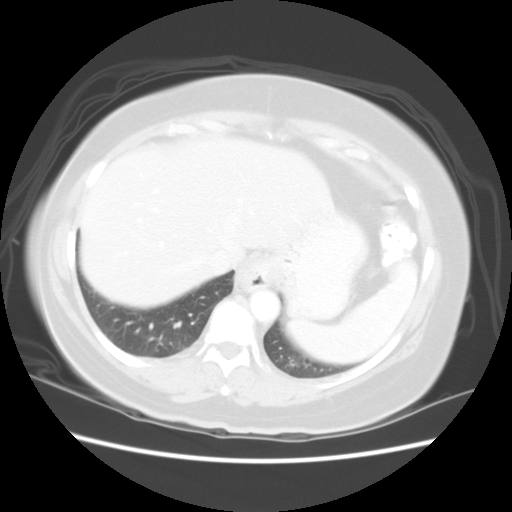
[im 70/79  soft-tissue]
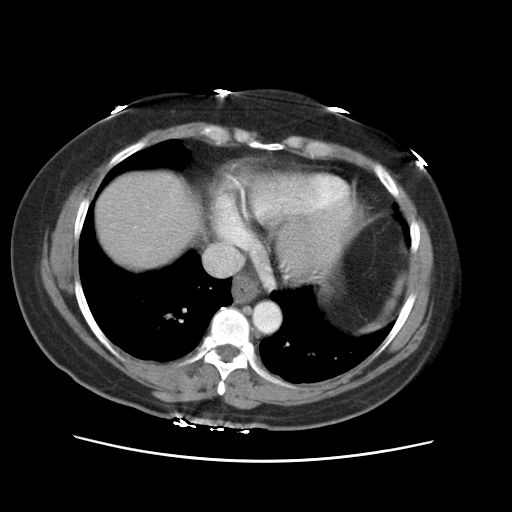
[im 70/79  lung]
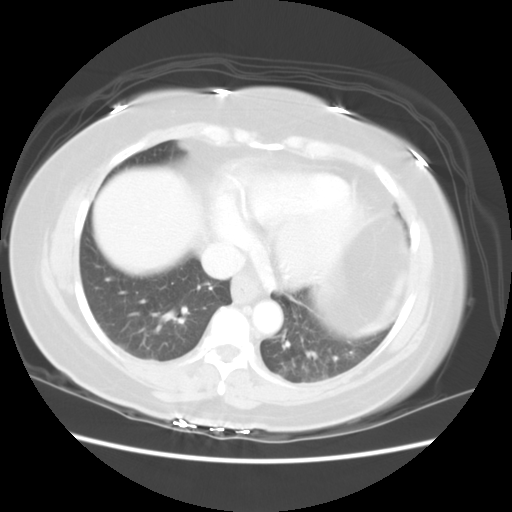
[im 74/79  soft-tissue]
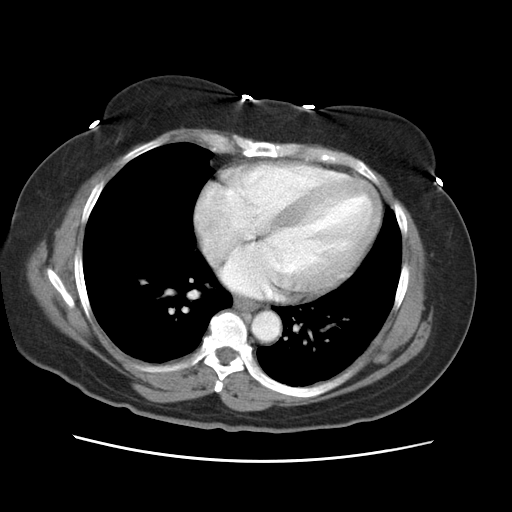
[im 74/79  lung]
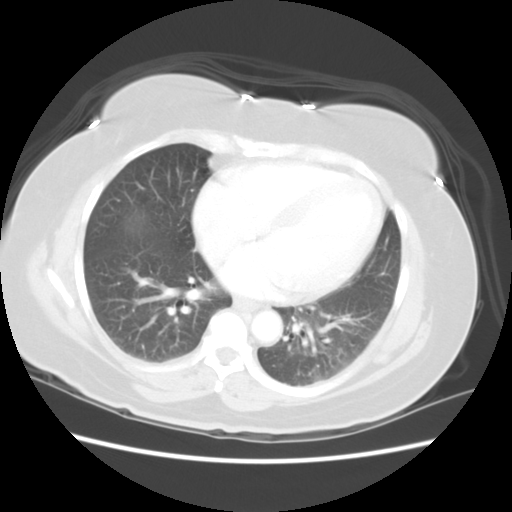

[14 of 32 positions shown; findings below may reference images not displayed]

FINDINGS: Liver, spleen, pancreas, adrenals unremarkable. Small bilateral
simple appearing renal cysts, unchanged. Gallbladder and bowel grossly
unremarkable. No free fluid, free air, or adenopathy. Visualized skeleton
unremarkable.

IMPRESSION

No acute findings. No evidence of metastatic disease.

PELVIS CT WITH CONTRAST
FINDINGS: Sigmoid diverticulosis again noted, without evidence of active
diverticulitis. Uterus and adnexa unremarkable. Bowel grossly unremarkable. No
free fluid, free air, or adenopathy. Visualized bony structures unremarkable.

IMPRESSION

Diverticulosis.

## 2005-05-26 ENCOUNTER — Ambulatory Visit: Payer: Self-pay | Admitting: Oncology

## 2005-05-30 ENCOUNTER — Encounter: Admission: RE | Admit: 2005-05-30 | Discharge: 2005-05-30 | Payer: Self-pay | Admitting: Oncology

## 2005-05-30 IMAGING — CR DG CHEST 2V
2 series · 2 of 2 positions shown · non-contrast
Comparison: [DATE] and [DATE]

CLINICAL DATA: Colon cancer. 
 CHEST ? TWO VIEWS:

[w chest pa]
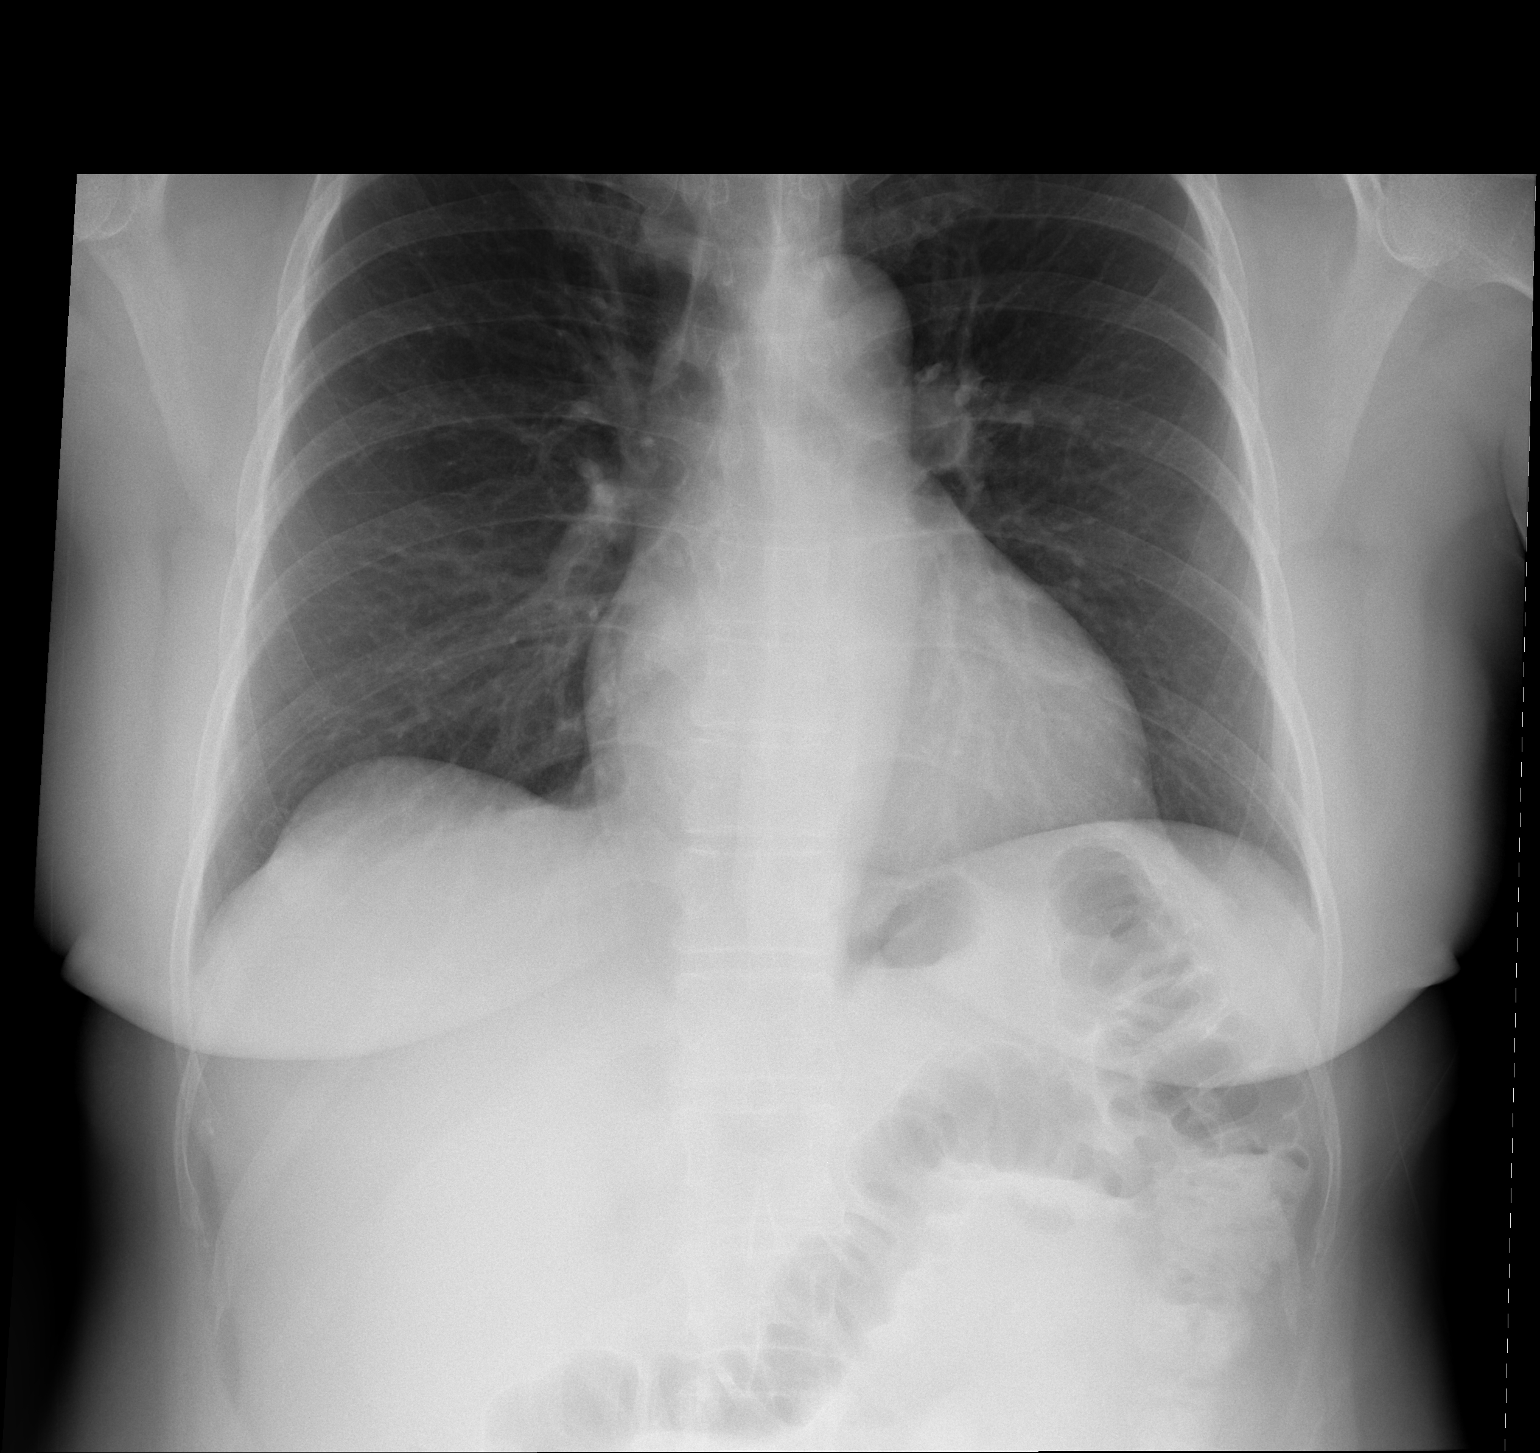

[w chest lat]
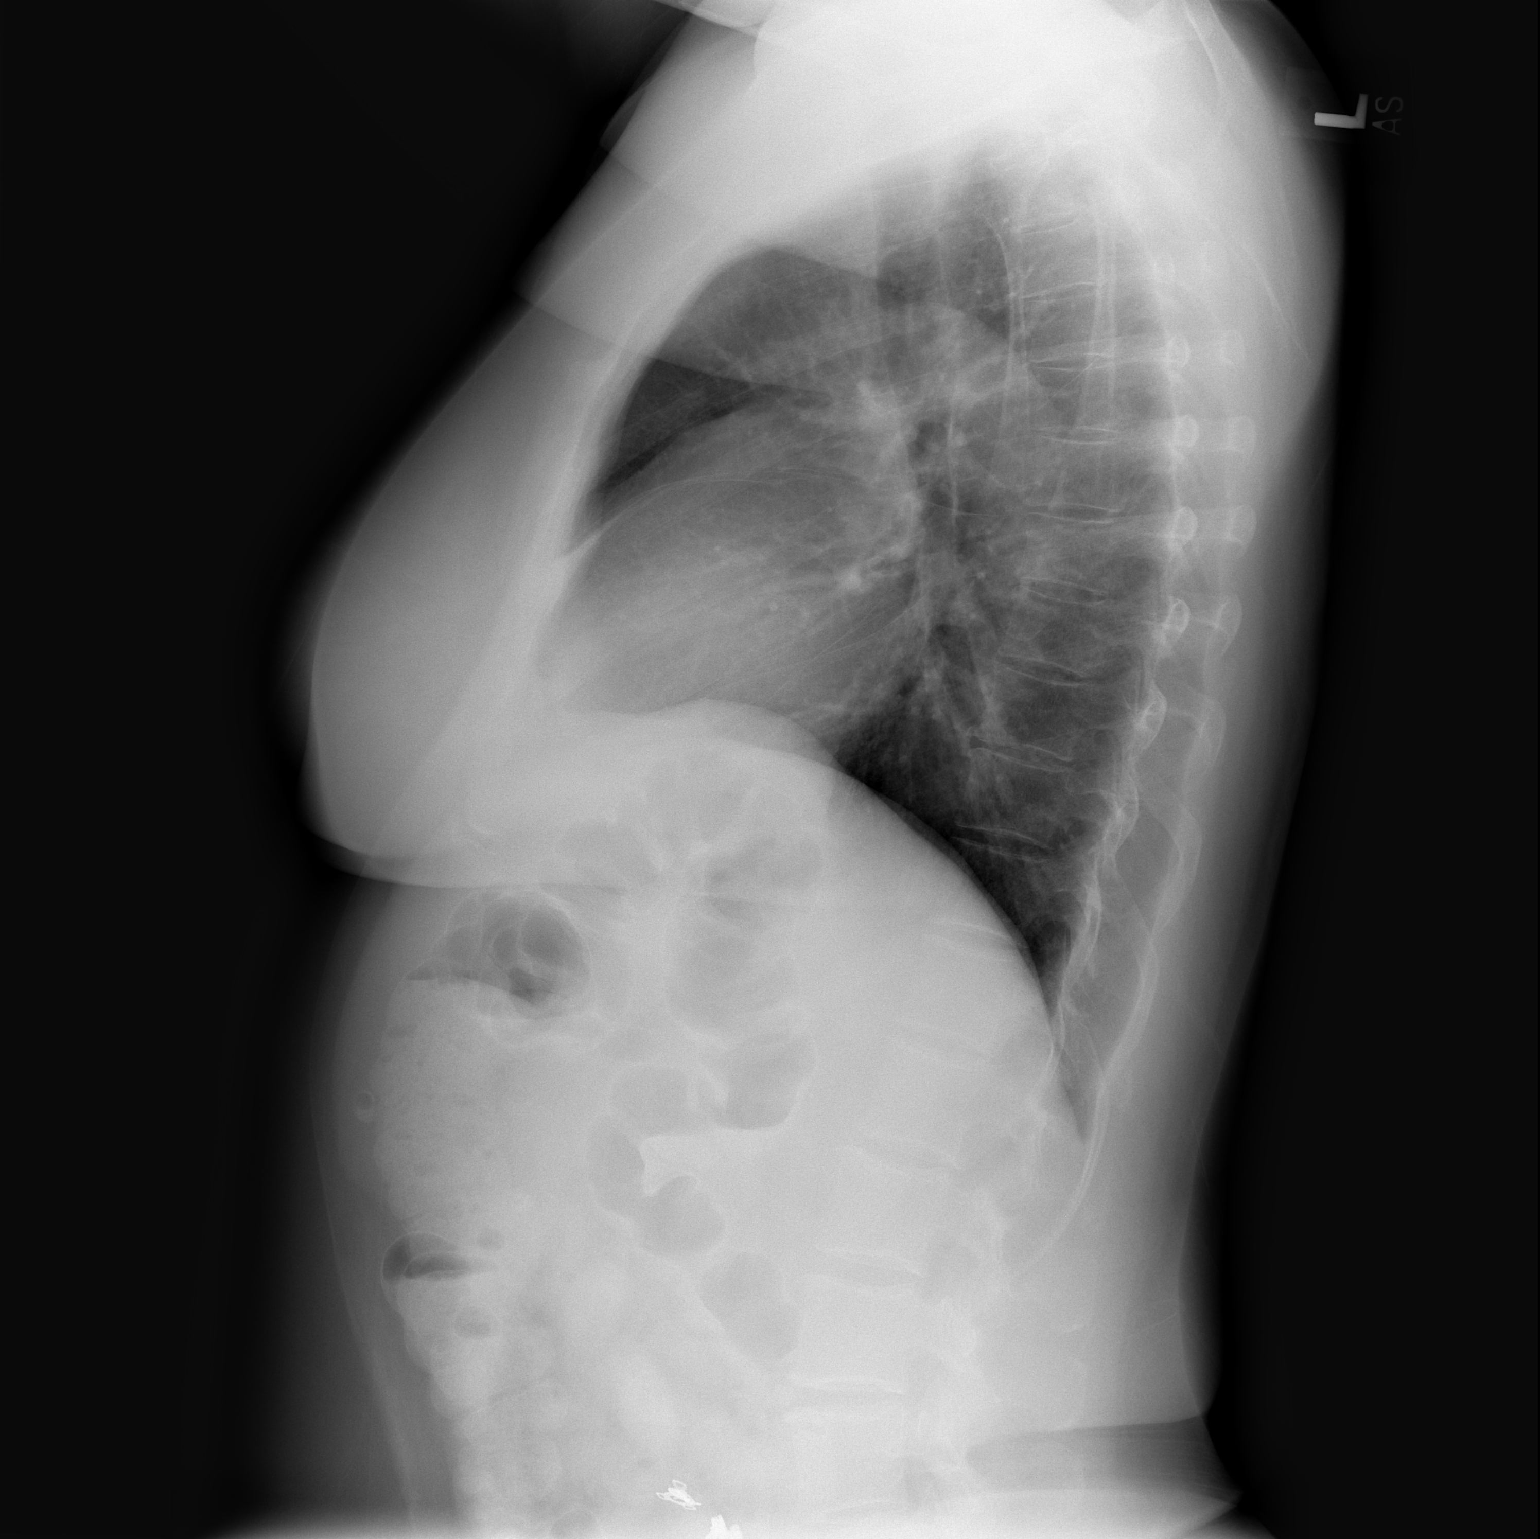

[2 of 2 positions shown; findings below may reference images not displayed]

FINDINGS: There is interval improved aeration of the lung bases.  The lungs are now clear.  There is no pleural effusion. Cardiomediastinal contours are stable.  No osseous lesions are identified.
IMPRESSION: Interval improved pulmonary aeration.  No active cardiopulmonary process or evidence of metastatic disease.

## 2005-08-23 ENCOUNTER — Ambulatory Visit (HOSPITAL_COMMUNITY): Admission: RE | Admit: 2005-08-23 | Discharge: 2005-08-23 | Payer: Self-pay | Admitting: Gastroenterology

## 2005-08-23 ENCOUNTER — Encounter (INDEPENDENT_AMBULATORY_CARE_PROVIDER_SITE_OTHER): Payer: Self-pay | Admitting: *Deleted

## 2005-11-23 ENCOUNTER — Ambulatory Visit: Payer: Self-pay | Admitting: Oncology

## 2005-11-27 LAB — COMPREHENSIVE METABOLIC PANEL
ALT: 23 U/L (ref 0–40)
CO2: 25 mEq/L (ref 19–32)
Calcium: 8.9 mg/dL (ref 8.4–10.5)
Chloride: 105 mEq/L (ref 96–112)
Creatinine, Ser: 0.8 mg/dL (ref 0.4–1.2)
Glucose, Bld: 138 mg/dL — ABNORMAL HIGH (ref 70–99)
Sodium: 141 mEq/L (ref 135–145)
Total Protein: 7.5 g/dL (ref 6.0–8.3)

## 2005-11-27 LAB — CEA: CEA: 1.2 ng/mL (ref 0.0–5.0)

## 2005-11-29 ENCOUNTER — Encounter: Admission: RE | Admit: 2005-11-29 | Discharge: 2005-11-29 | Payer: Self-pay | Admitting: Oncology

## 2005-11-29 IMAGING — CT CT PELVIS W/ CM
2 of 5 series · 17 of 46 positions shown, 19 images · IV contrast (REDICAT/H2O & [ID] OMNIPAQUE)
Comparison: [DATE].

CLINICAL DATA: Colon carcinoma.  Follow-up.
CT ABDOMEN AND PELVIS WITH CONTRAST:
TECHNIQUE: Multidetector CT imaging of the abdomen and pelvis was performed following the standard protocol during bolus administration of intravenous contrast and after administration of oral contrast.
Contrast:  100 cc of Omnipaque 300.

[Series 2: — · axial · 0.70mm/px · z∈[-362,-2]mm · 14 of 81 slices shown, 16 images]
[im 5/81  soft-tissue]
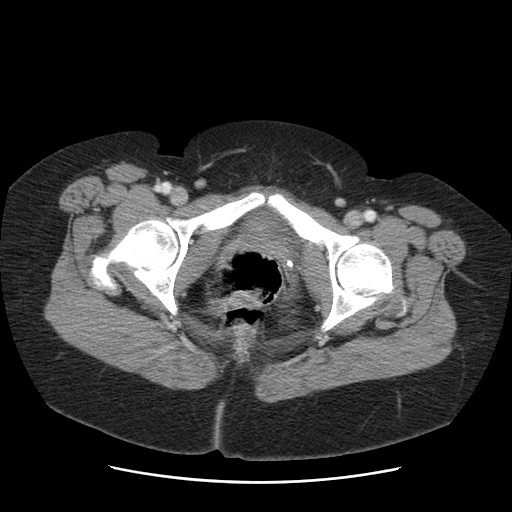
[im 5/81  bone]
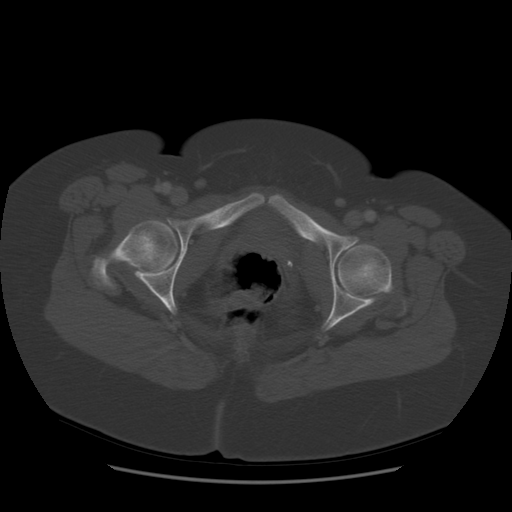
[im 13/81  soft-tissue]
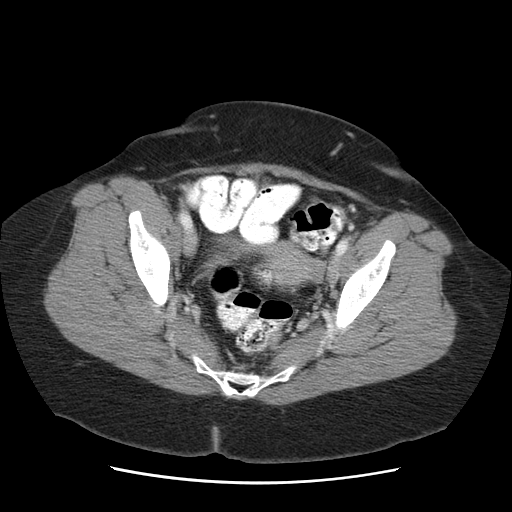
[im 17/81  soft-tissue]
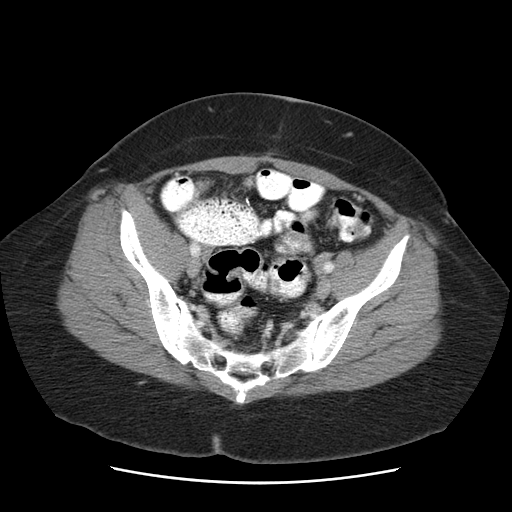
[im 21/81  soft-tissue]
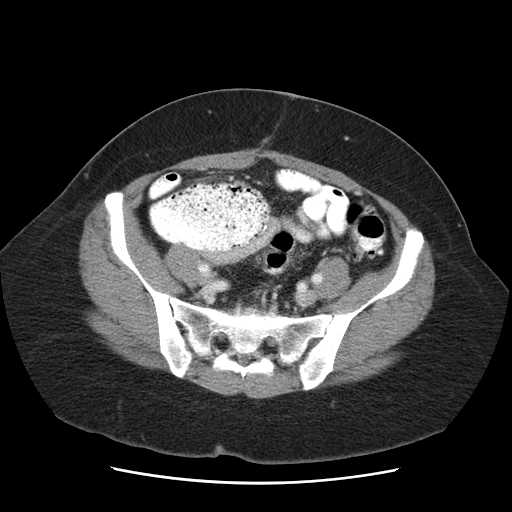
[im 29/81  soft-tissue]
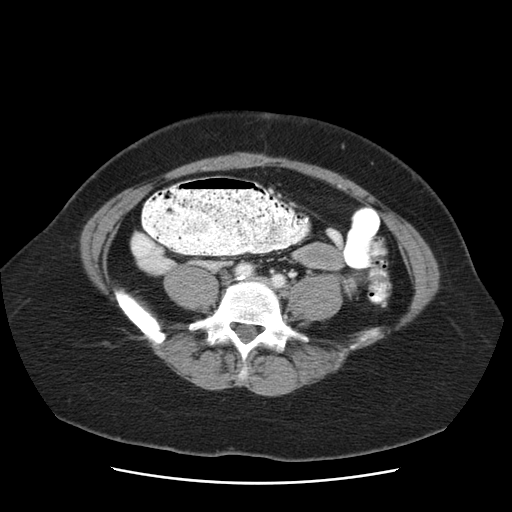
[im 33/81  soft-tissue]
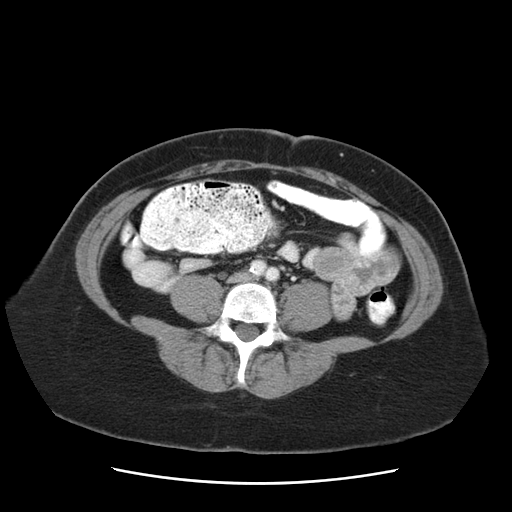
[im 37/81  soft-tissue]
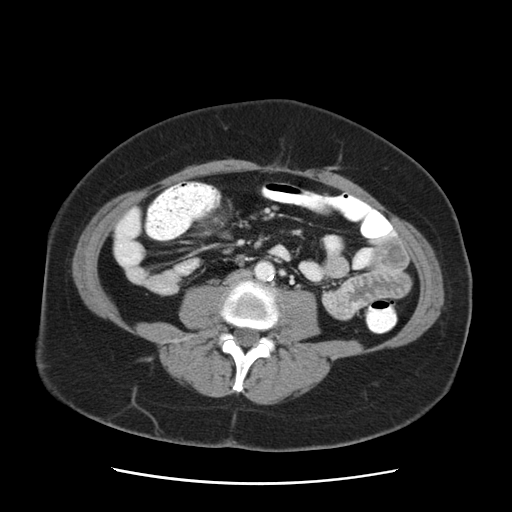
[im 45/81  soft-tissue]
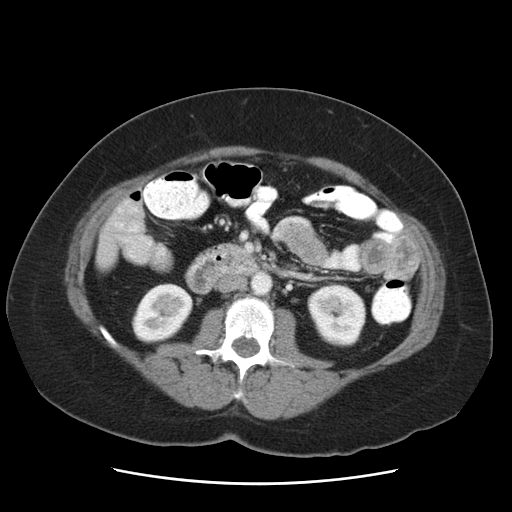
[im 49/81  soft-tissue]
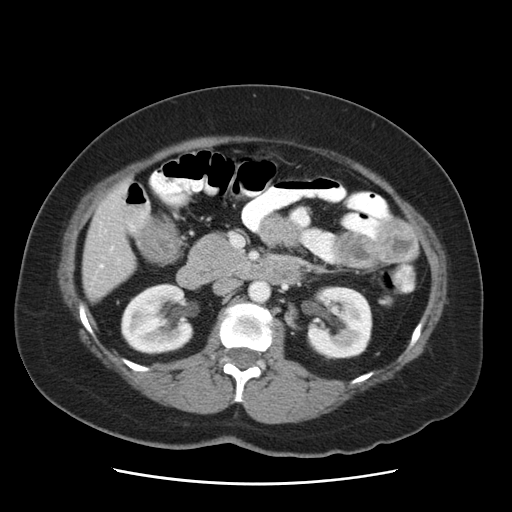
[im 49/81  bone]
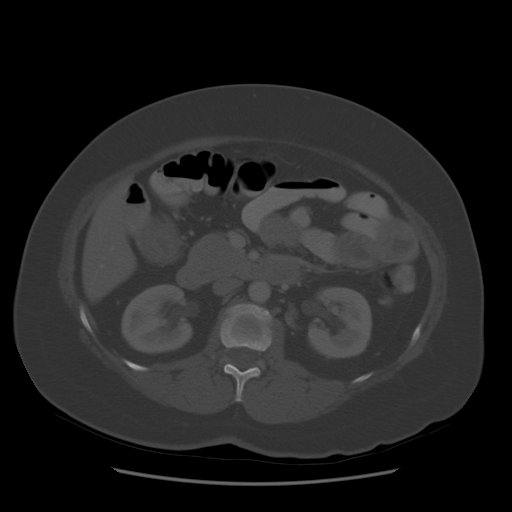
[im 53/81  soft-tissue]
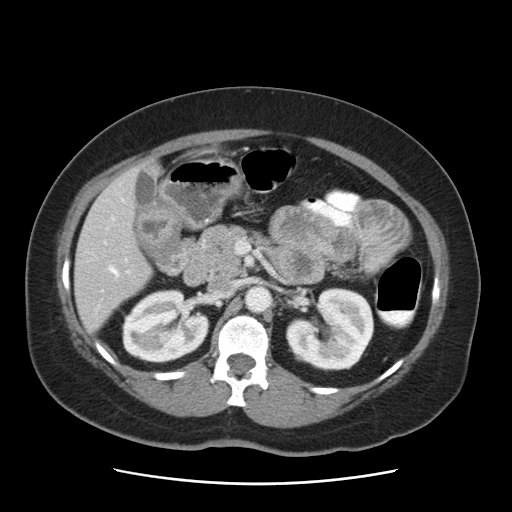
[im 61/81  soft-tissue]
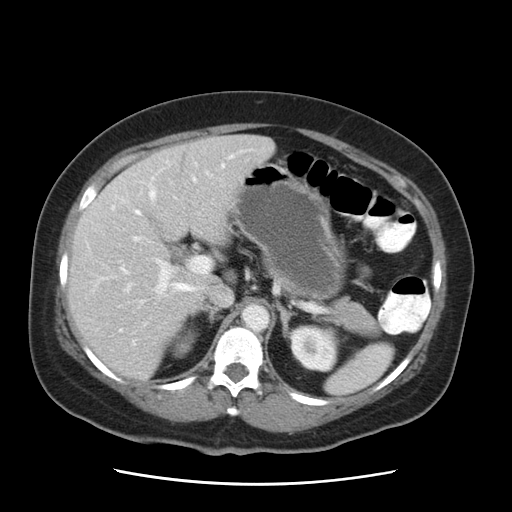
[im 65/81  soft-tissue]
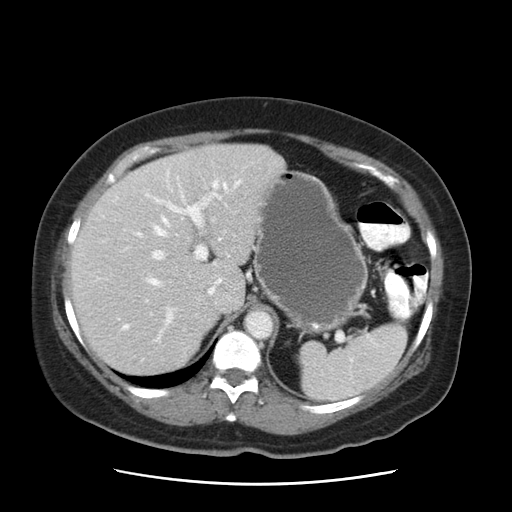
[im 69/81  soft-tissue]
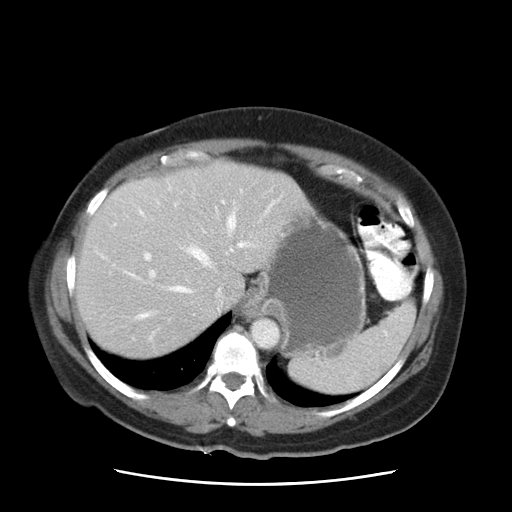
[im 77/81  soft-tissue]
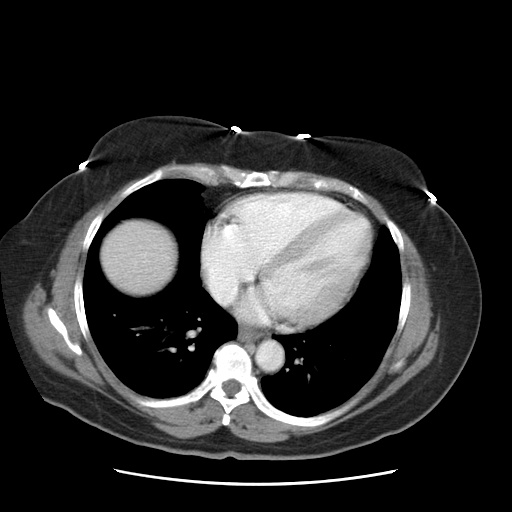

[Series 797: reformatted · sagittal · 0.85mm/px · 3 of 120 slices shown]
[im 40/120  soft-tissue]
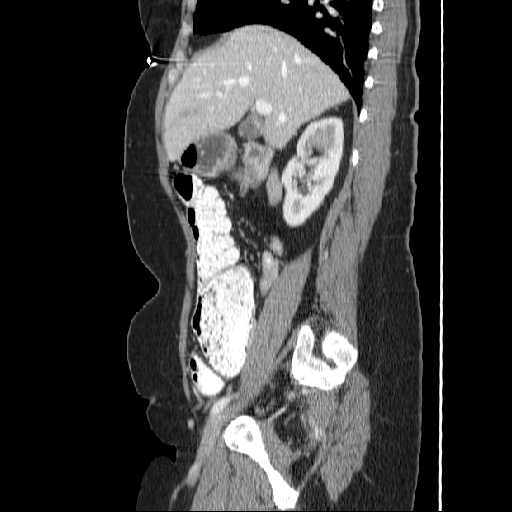
[im 53/120  soft-tissue]
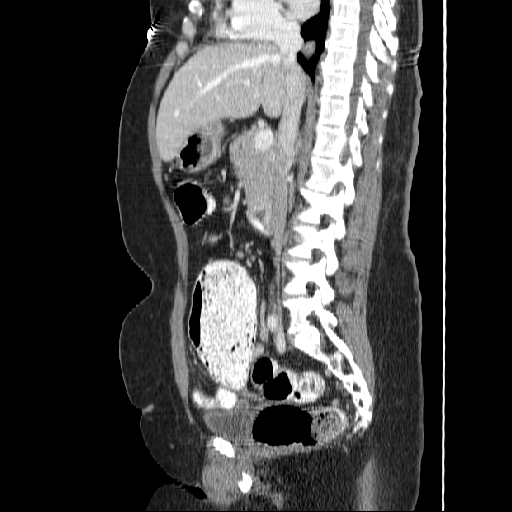
[im 67/120  soft-tissue]
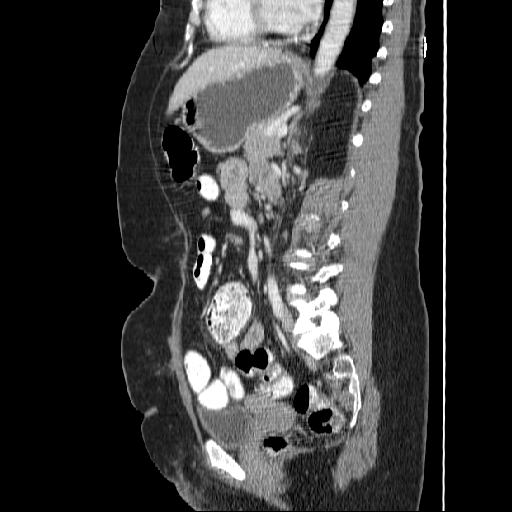

[17 of 46 positions shown; findings below may reference images not displayed]

FINDINGS: CT ABDOMEN WITH CONTRAST:
The liver, spleen, gallbladder, adrenal glands, pancreas, and right kidney are unremarkable.  Stable indeterminate 6 mm lesion in the medial left kidney (image 28) appears slightly larger than on the [DATE] study.  There are several simple left renal cysts also present.  No evidence of enlarged lymph nodes, free fluid, abdominal aortic aneurysm, or biliary dilatation.  Visualized bowel is within normal limits.
IMPRESSION: 1.  6 mm indeterminate lesion within the medial left kidney ? small solid lesion not excluded and recommend close interval follow-up or MR.
CT PELVIS WITH CONTRAST:
Moderate stool in the cecum is noted.  No evidence of free fluid or enlarged lymph nodes identified.  Colonic and sigmoid diverticulosis without evidence of diverticulitis noted.
IMPRESSION: No acute abnormality or significant changes.   No definite evidence of recurrence of metastatic disease.

## 2005-11-29 IMAGING — CR DG CHEST 2V
2 series · 2 of 2 positions shown · non-contrast
Comparison: [DATE].

CLINICAL DATA: History of colon carcinoma.  Follow-up.
 CHEST ? 2 VIEW:

[view not recorded (1 of 2)]
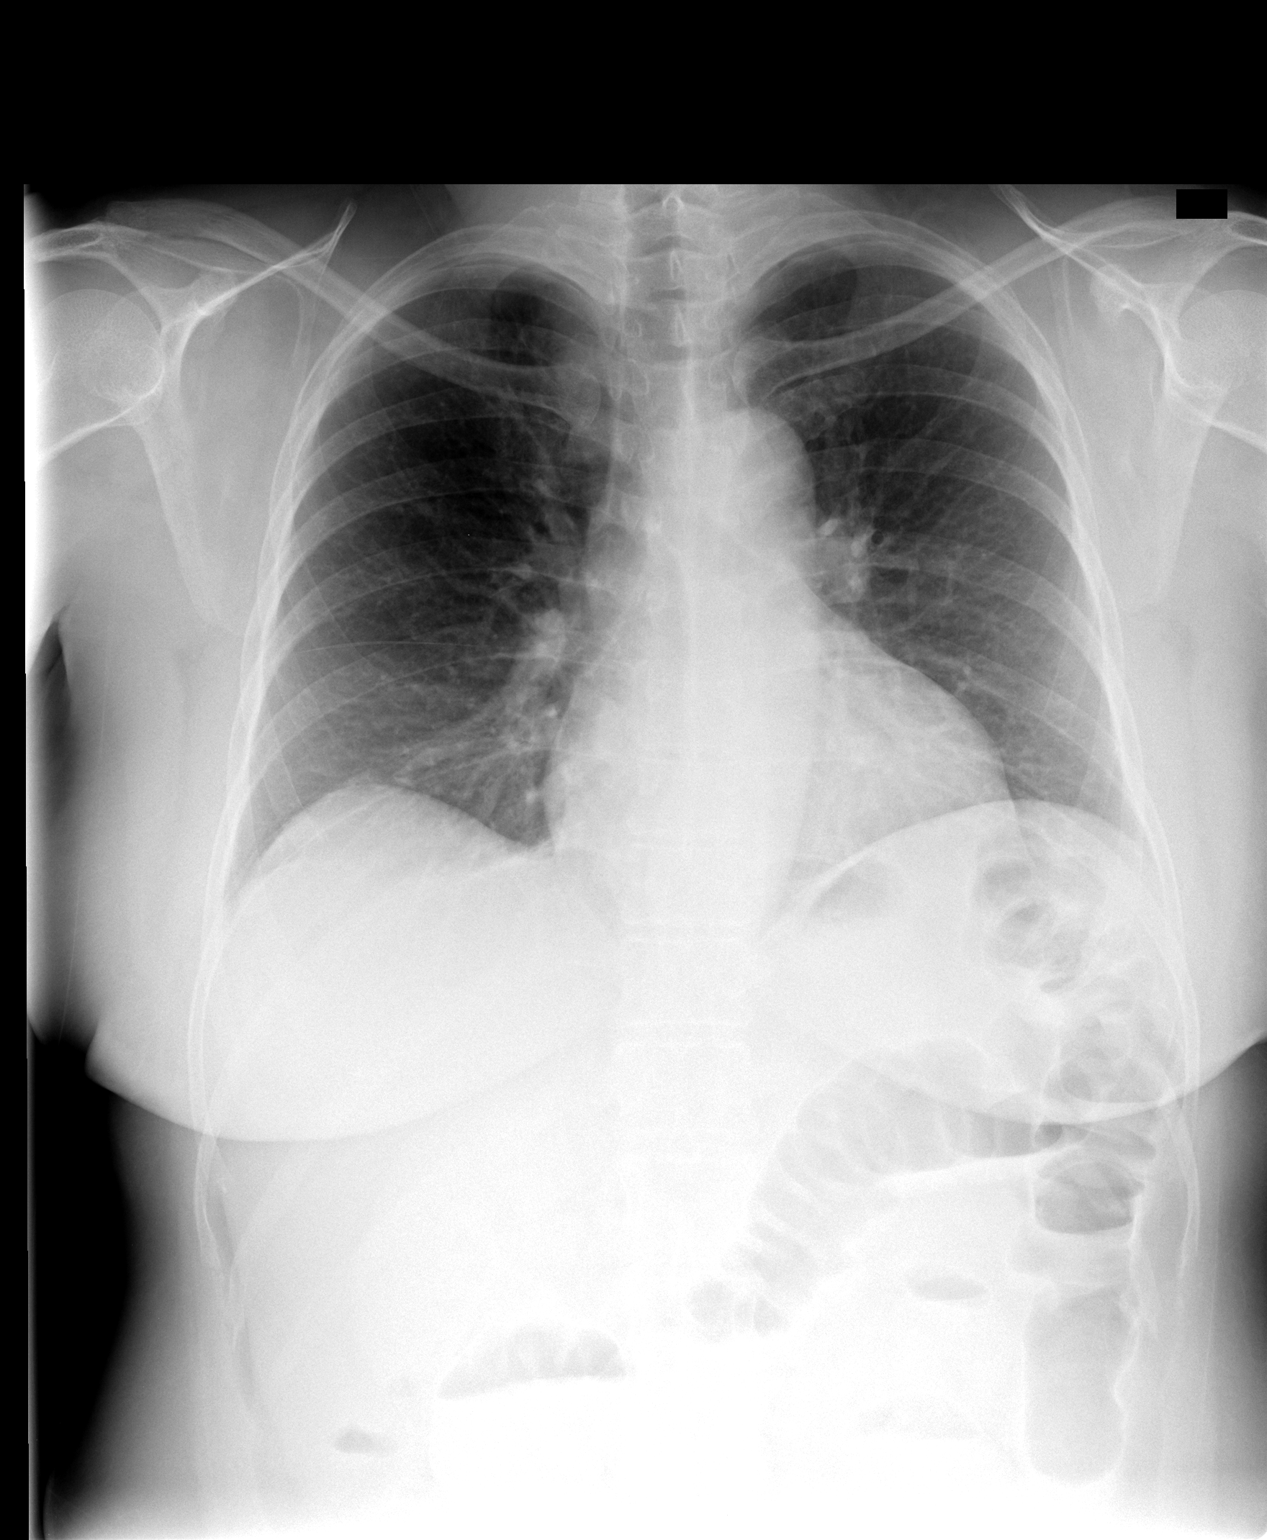

[view not recorded (2 of 2)]
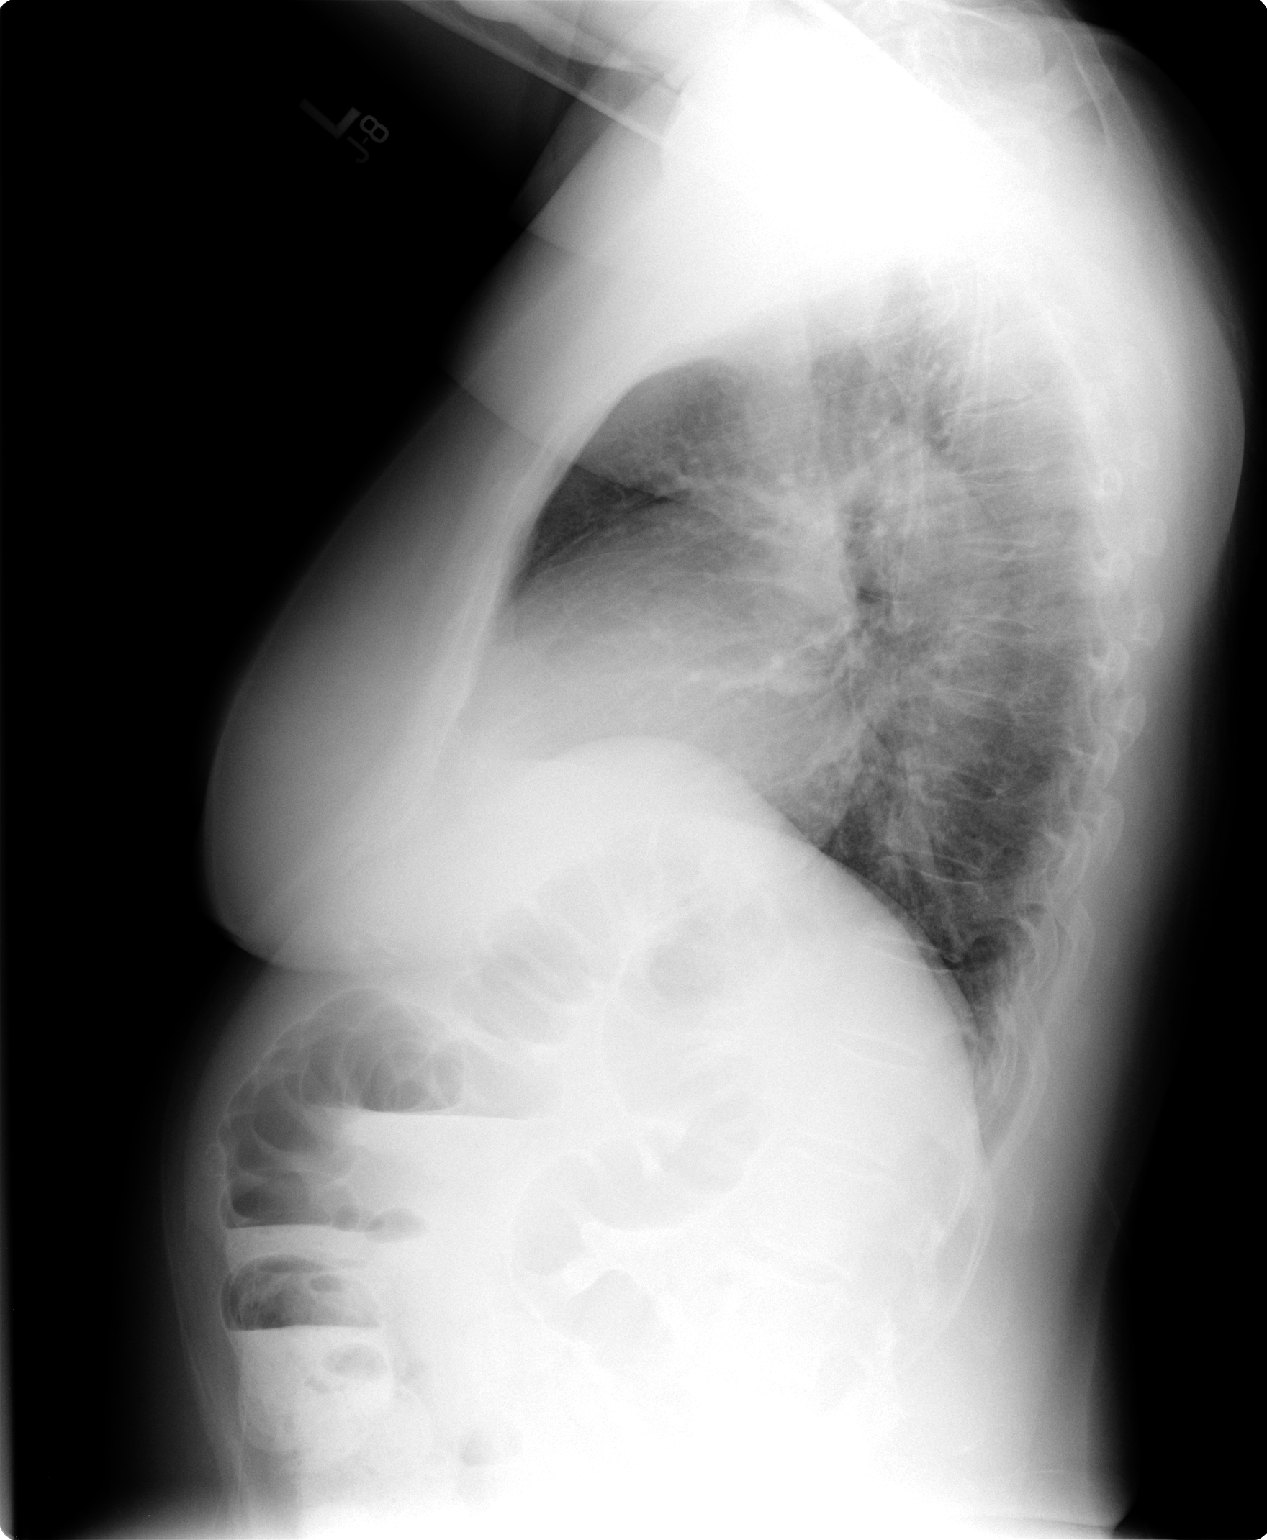

[2 of 2 positions shown; findings below may reference images not displayed]

FINDINGS: Two views of the chest show the lungs to be clear.  The heart is within the upper limits of normal.  No bony abnormality is seen.   Two air fluid levels are noted in the nondistended colon.
IMPRESSION: No active lung disease.

## 2006-05-31 ENCOUNTER — Ambulatory Visit: Payer: Self-pay | Admitting: Oncology

## 2006-09-11 ENCOUNTER — Emergency Department (HOSPITAL_COMMUNITY): Admission: EM | Admit: 2006-09-11 | Discharge: 2006-09-11 | Payer: Self-pay | Admitting: Emergency Medicine

## 2006-10-26 IMAGING — CR DG CHEST 2V
2 series · 2 of 2 positions shown · non-contrast
Comparison: [DATE]

CLINICAL DATA: Colon cancer/routine follow-up.

 CHEST - 2 VIEW:

[w chest pa]
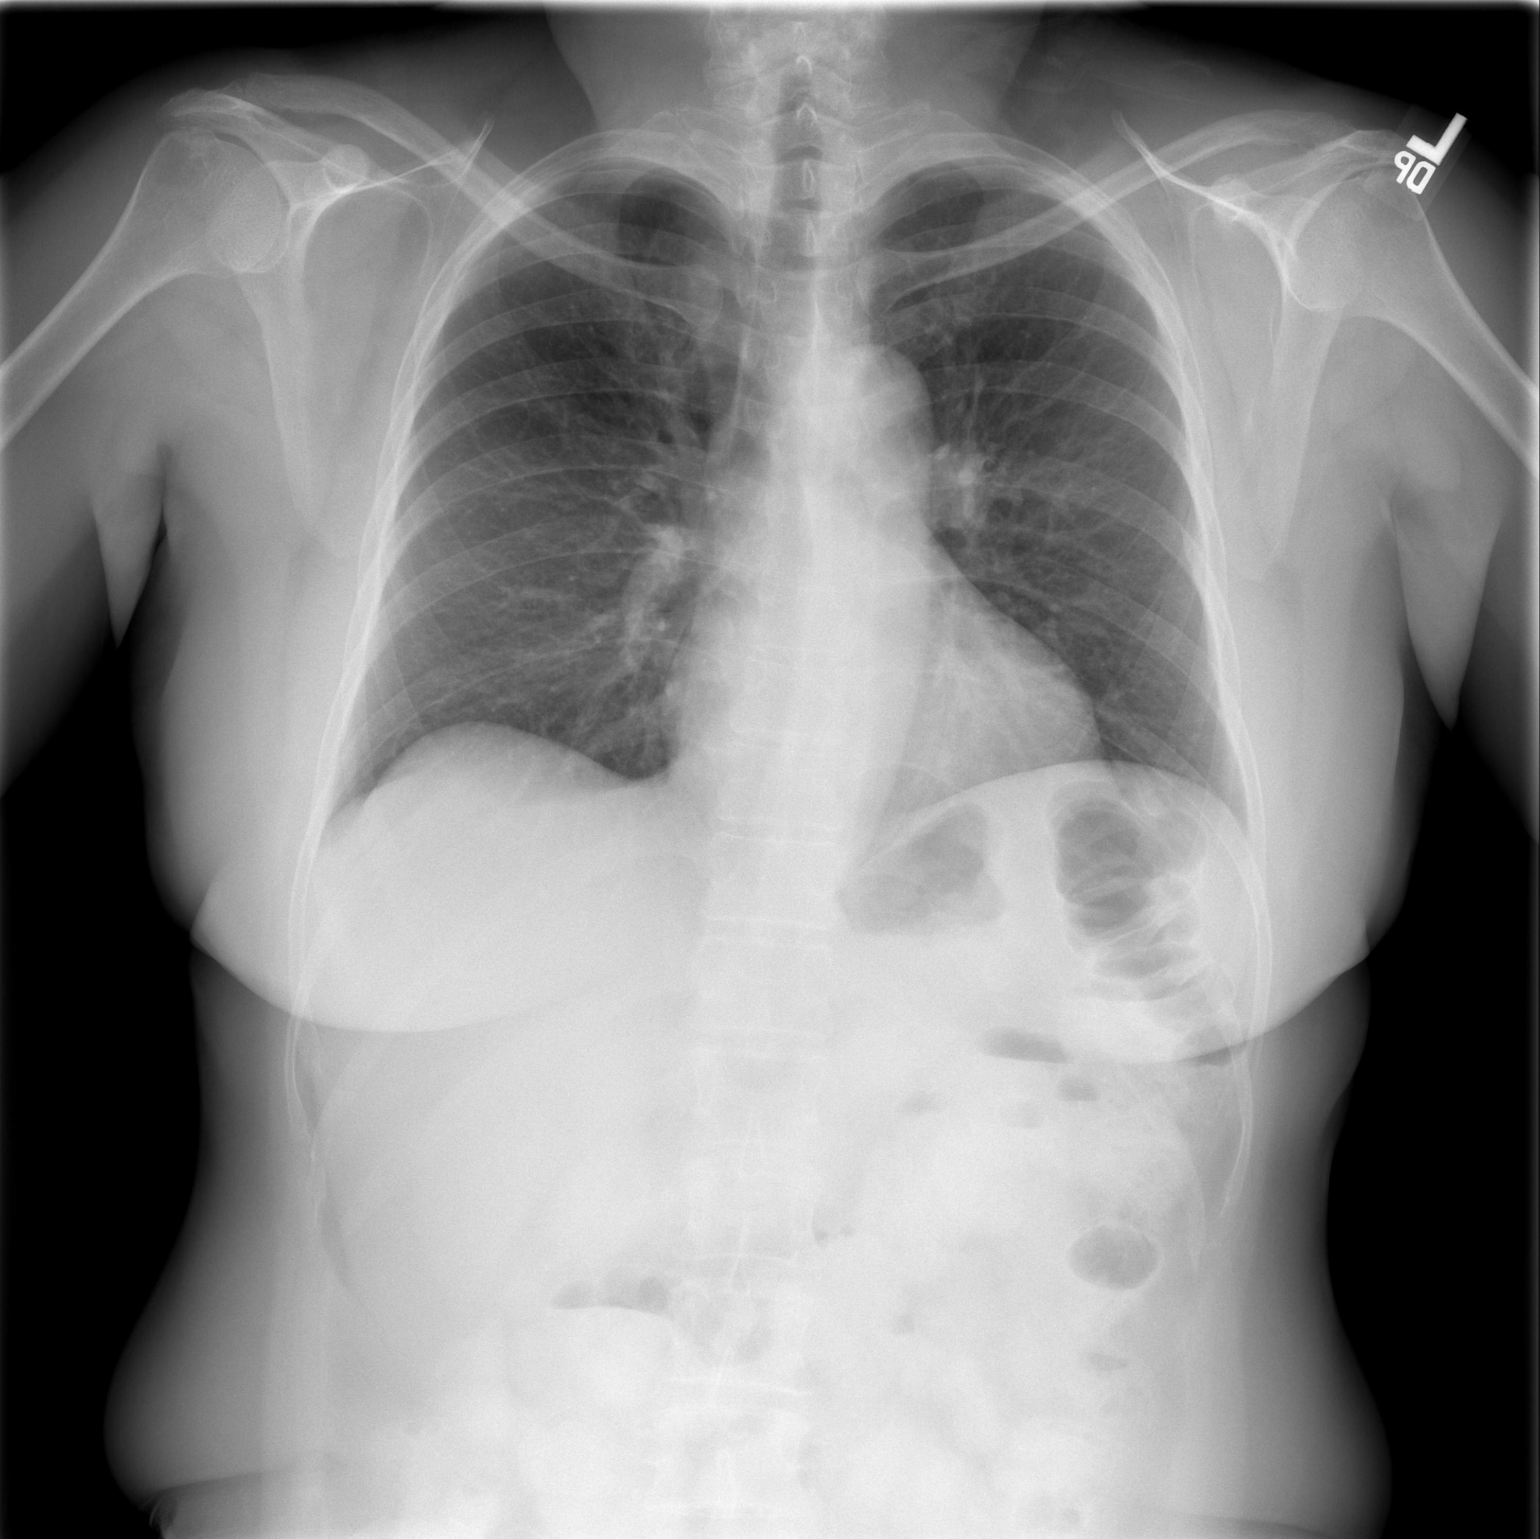

[w chest lat]
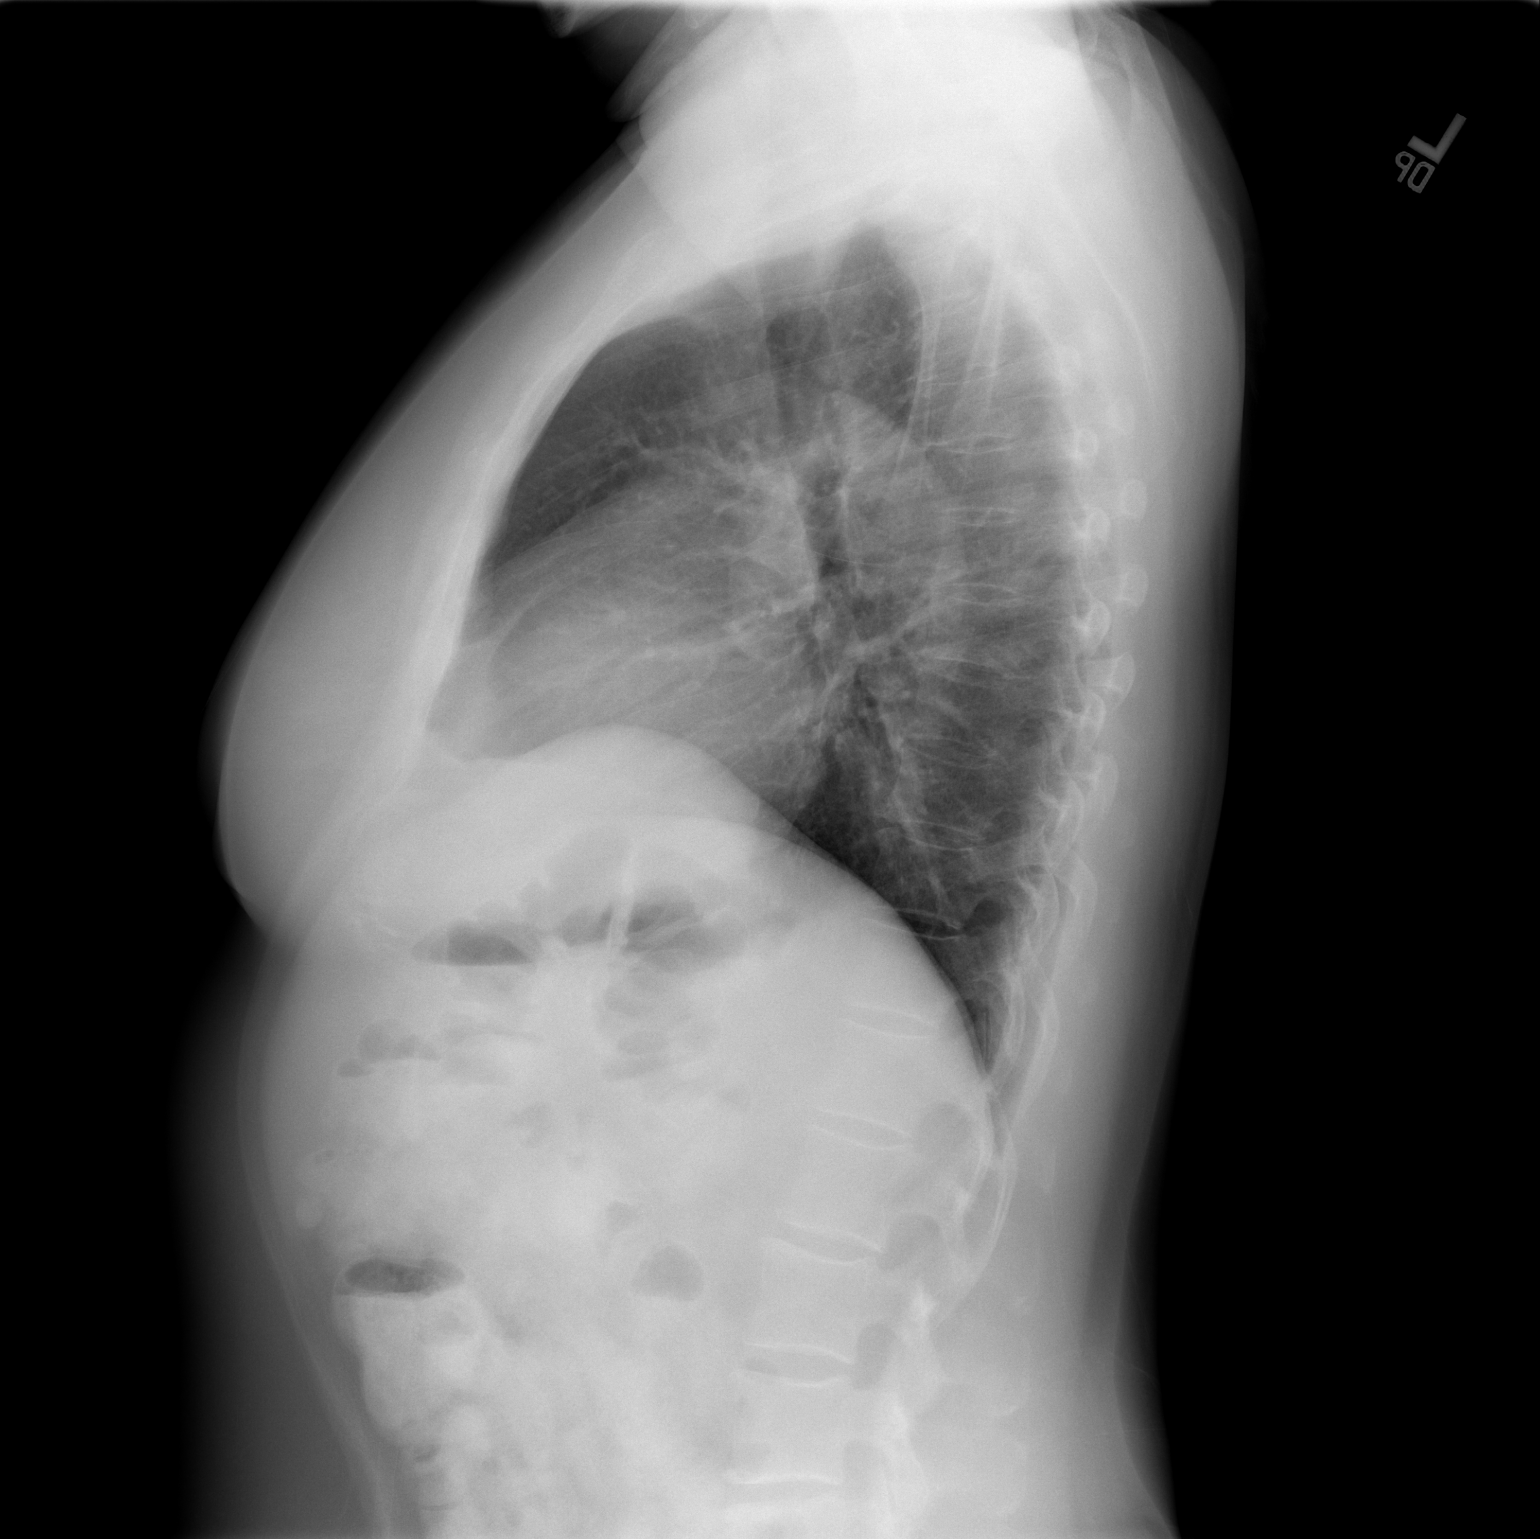

[2 of 2 positions shown; findings below may reference images not displayed]

FINDINGS: Heart and lungs normal.  Osseous structures intact.   No change since the prior study.
IMPRESSION: No active disease.

## 2006-11-22 ENCOUNTER — Ambulatory Visit: Payer: Self-pay | Admitting: Oncology

## 2006-11-26 LAB — COMPREHENSIVE METABOLIC PANEL
ALT: 18 U/L (ref 0–35)
Alkaline Phosphatase: 148 U/L — ABNORMAL HIGH (ref 39–117)
Potassium: 3.4 mEq/L — ABNORMAL LOW (ref 3.5–5.3)
Sodium: 141 mEq/L (ref 135–145)
Total Bilirubin: 0.3 mg/dL (ref 0.3–1.2)
Total Protein: 7.9 g/dL (ref 6.0–8.3)

## 2006-11-29 ENCOUNTER — Encounter: Admission: RE | Admit: 2006-11-29 | Discharge: 2006-11-29 | Payer: Self-pay | Admitting: Oncology

## 2006-11-29 IMAGING — CT CT ABDOMEN W/ CM
2 of 5 series · 17 of 46 positions shown, 19 images · IV contrast (omnipaque)
Comparison: [DATE]

ABDOMEN CT WITH CONTRAST

CLINICAL DATA: Colon cancer
TECHNIQUE: Multidetector CT imaging of the abdomen and pelvis was performed
following the standard protocol during bolus administration of intravenous
contrast.

Contrast:  100 cc Omnipaque 300

[Series 3: routine abdomen · axial · 0.73mm/px · z∈[-392,-57]mm · 14 of 76 slices shown, 16 images]
[im 5/76  soft-tissue]
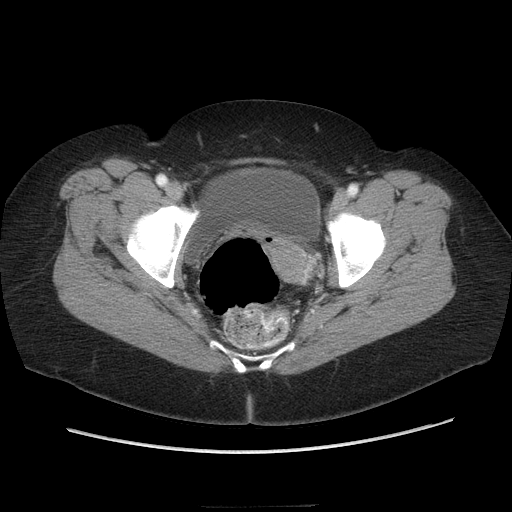
[im 5/76  bone]
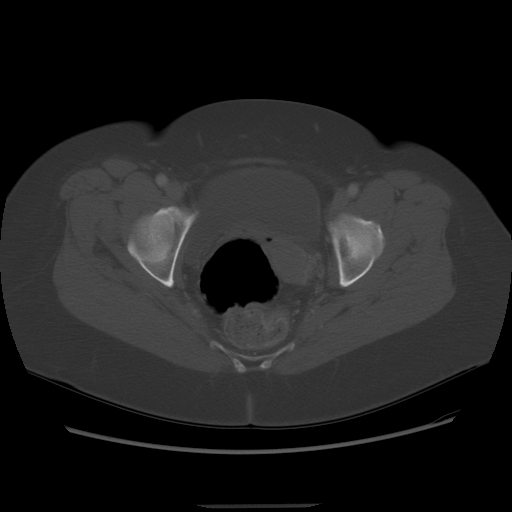
[im 9/76  soft-tissue]
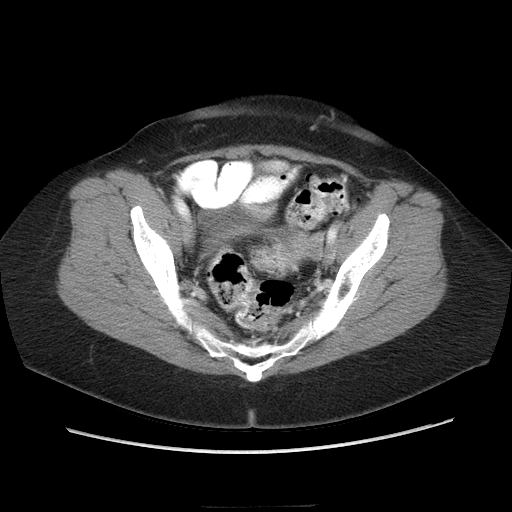
[im 17/76  soft-tissue]
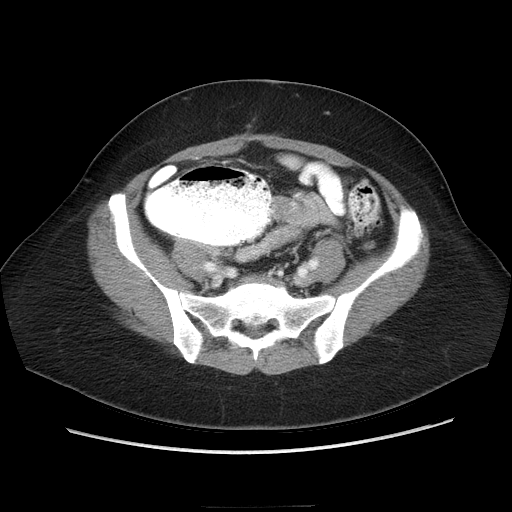
[im 21/76  soft-tissue]
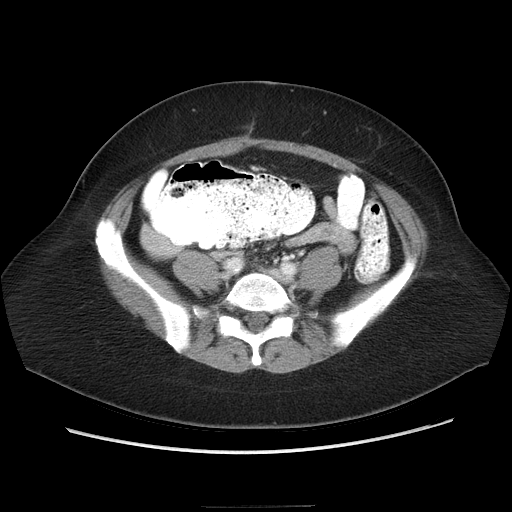
[im 26/76  soft-tissue]
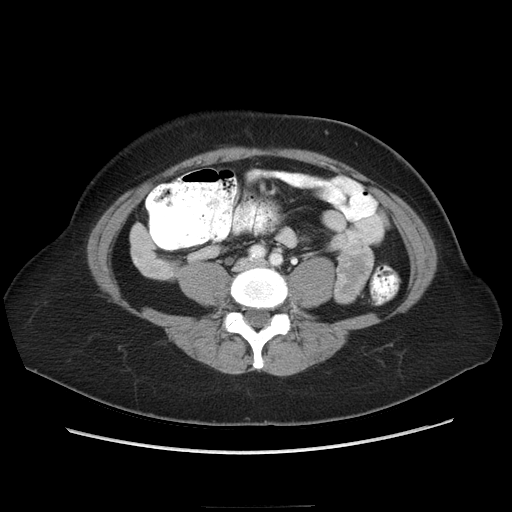
[im 30/76  soft-tissue]
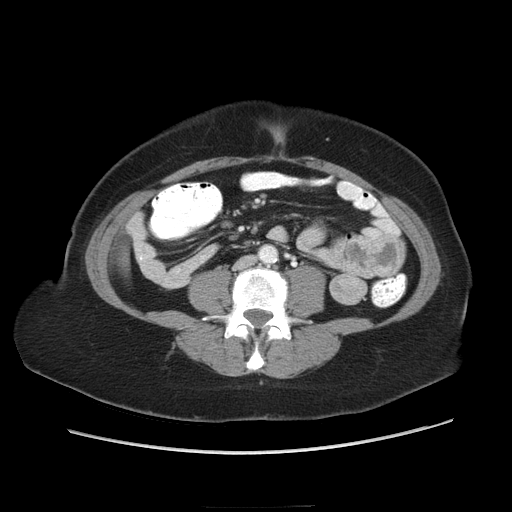
[im 34/76  soft-tissue]
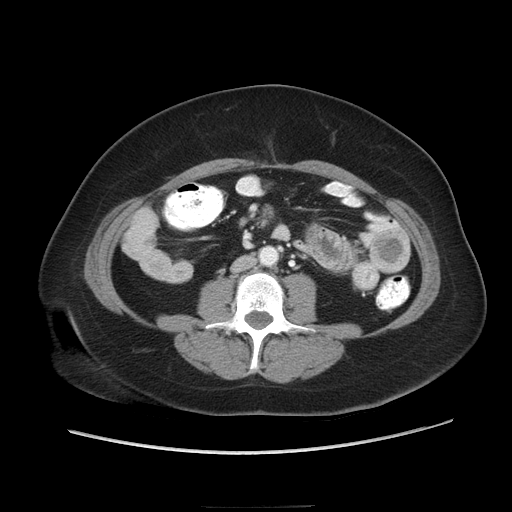
[im 42/76  soft-tissue]
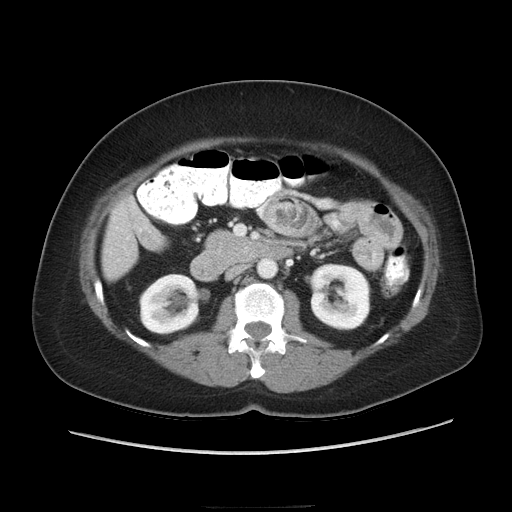
[im 46/76  soft-tissue]
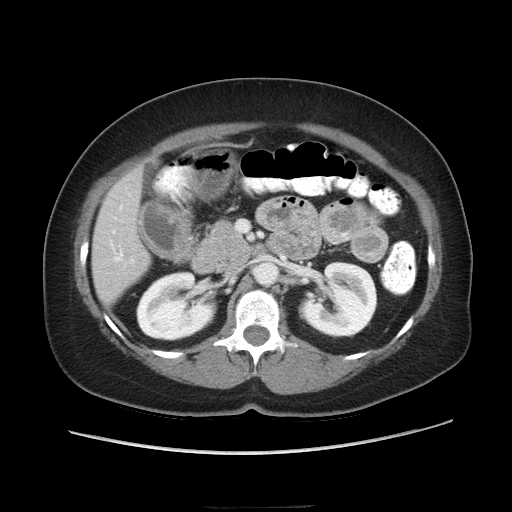
[im 46/76  bone]
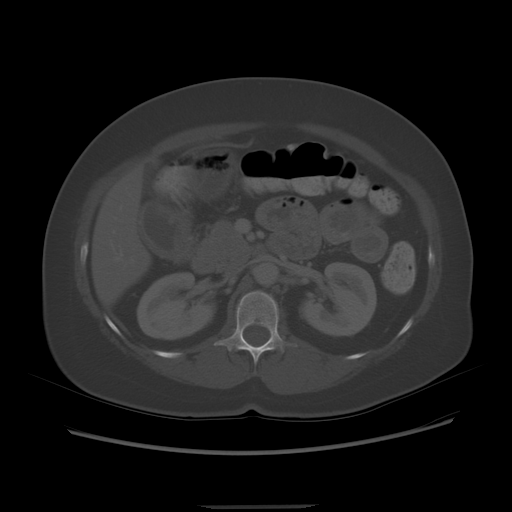
[im 51/76  soft-tissue]
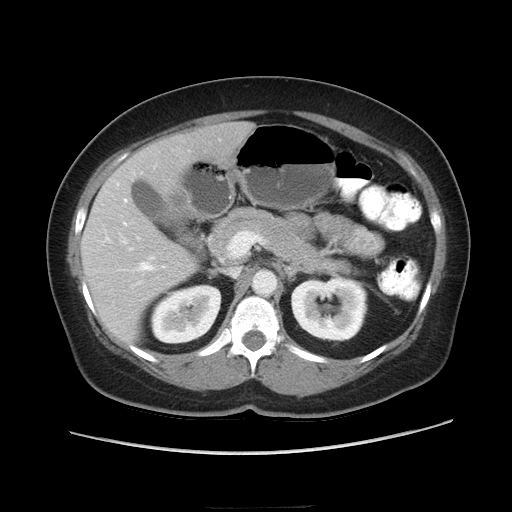
[im 55/76  soft-tissue]
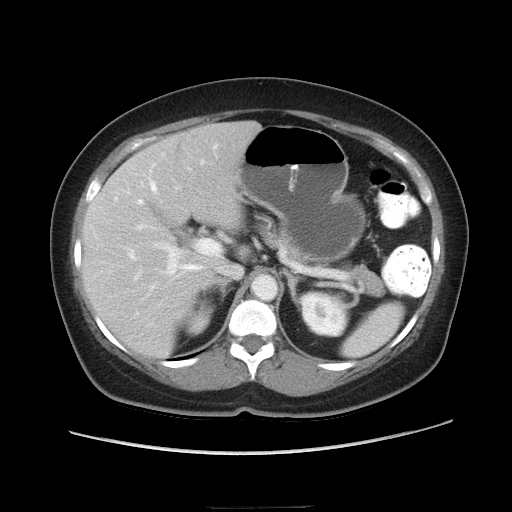
[im 59/76  soft-tissue]
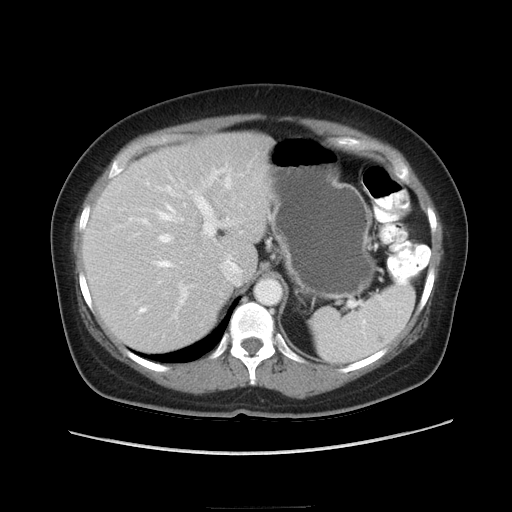
[im 67/76  soft-tissue]
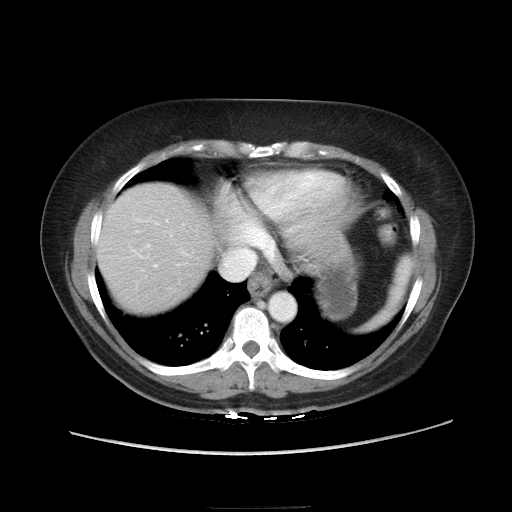
[im 71/76  soft-tissue]
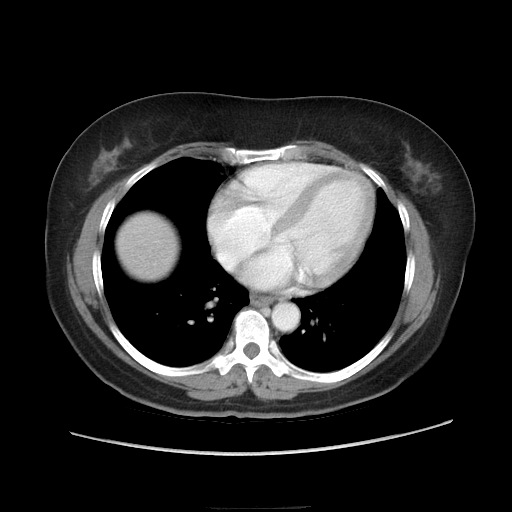

[Series 602: sagittal body · sagittal · 0.87mm/px · 3 of 152 slices shown]
[im 51/152  soft-tissue]
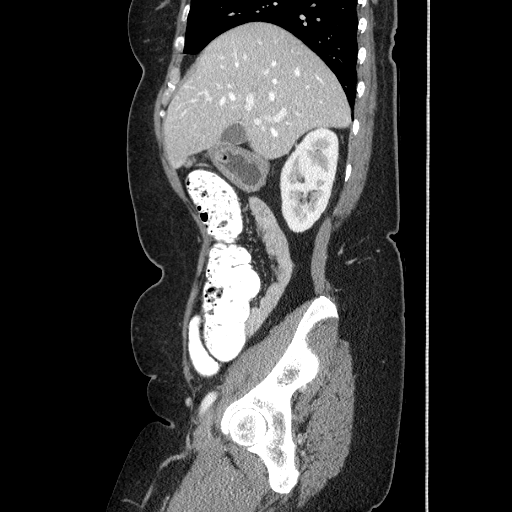
[im 68/152  soft-tissue]
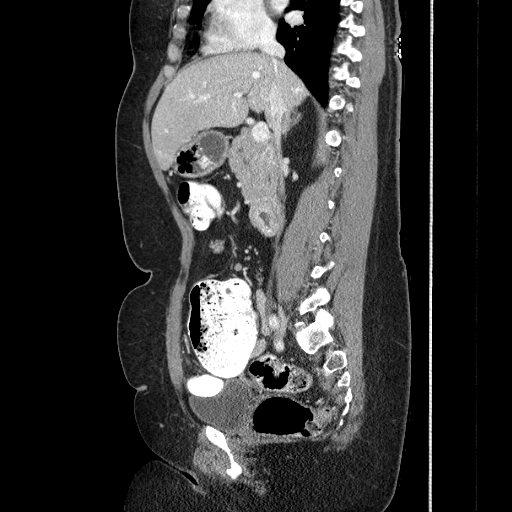
[im 84/152  soft-tissue]
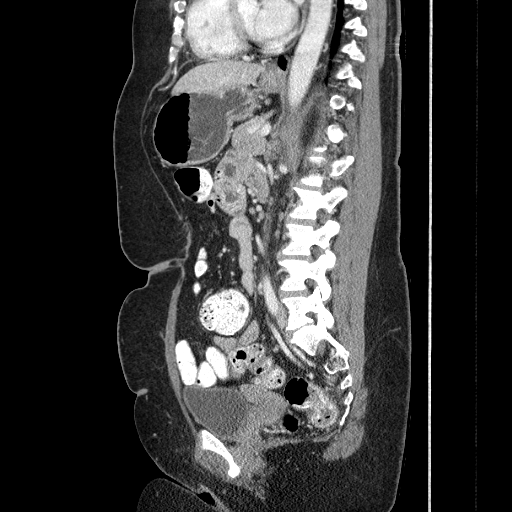

[17 of 46 positions shown; findings below may reference images not displayed]

FINDINGS: Lung bases clear.

Liver is normal.

The gallbladder is normal.

Spleen is normal.

Both adrenal glands are normal.

The right kidney is normal.

There is a focal area of low density within the left kidney which is too small
to characterize but stable from prior exam from image 23. 
A 6 mm indeterminant hyperattenuating lesion in the left kidney is unchanged,
image 28. This remains too small to characterize. 

Focal area of low density within the splenic parenchyma 5.3 mm.  This likely
represents a congenital or post traumatic cyst

Pancreas is negative. 

Both adrenal glands are normal.

Small bowel mesenteric lymph node measures 1.5 x 0.8 cm , image 44. This is
stable from prior exam. 

Negative for free fluid.

Negative for abnormal fluid collections.

The visualized bowel loops of the upper abdomen are unremarkable.

Review of the bone windows is negative.

IMPRESSION

6 mm indeterminate lesion within the medial left kidney. Small solid lesion not
excluded. Close interval followup or MRI recommended

PELVIS CT WITH CONTRAST
FINDINGS: Negative for free fluid.

Urinary bladder negative.

Uterus and adnexal structures negative.

No enlarged, or enlarging pelvic lymph nodes. 

Review of the bone windows is unremarkable.

IMPRESSION

1. No definite evidence of recurrence of metastatic disease

## 2007-05-30 ENCOUNTER — Ambulatory Visit: Payer: Self-pay | Admitting: Oncology

## 2007-11-21 ENCOUNTER — Ambulatory Visit: Payer: Self-pay | Admitting: Oncology

## 2007-11-27 ENCOUNTER — Encounter: Admission: RE | Admit: 2007-11-27 | Discharge: 2007-11-27 | Payer: Self-pay | Admitting: Oncology

## 2007-11-27 IMAGING — CR DG CHEST 2V
3 series · 3 of 3 positions shown · non-contrast
Comparison: [DATE]

CLINICAL DATA: Colon carcinoma evaluate for metastatic involvement

CHEST - 2 VIEW

[view not recorded (1 of 3)]
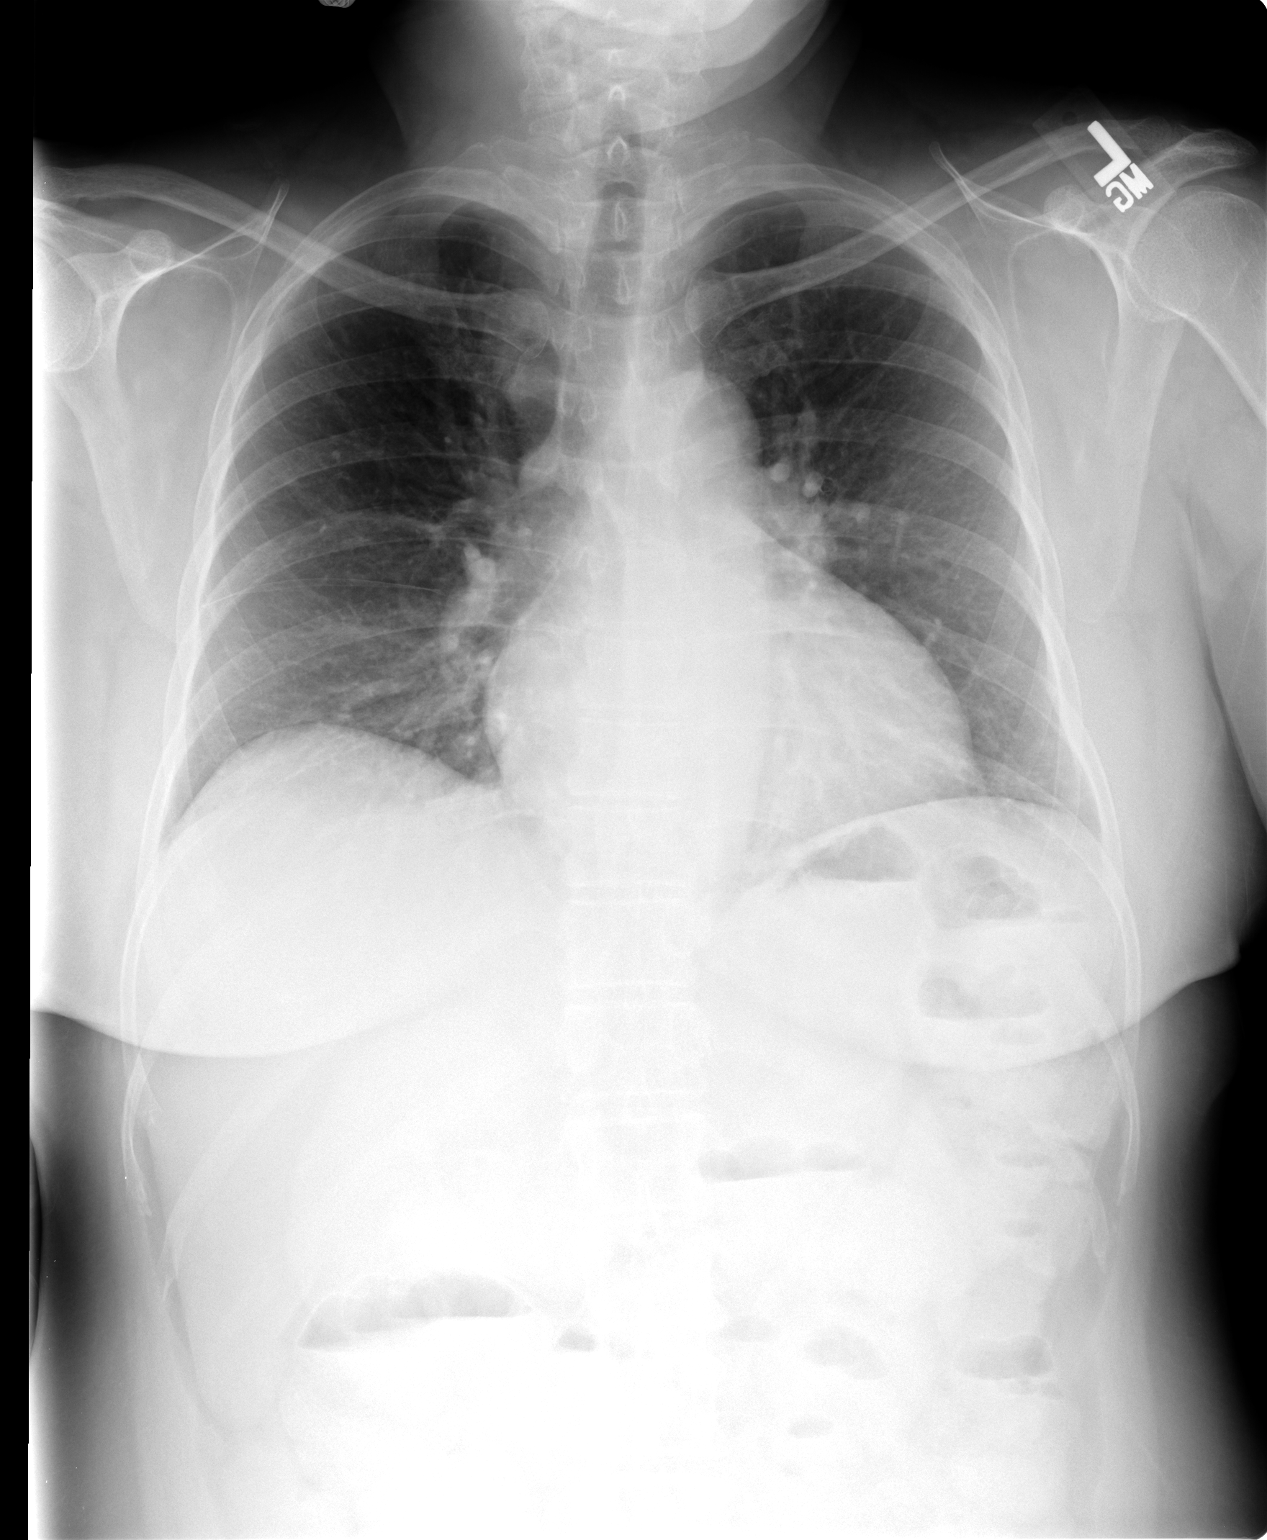

[view not recorded (2 of 3)]
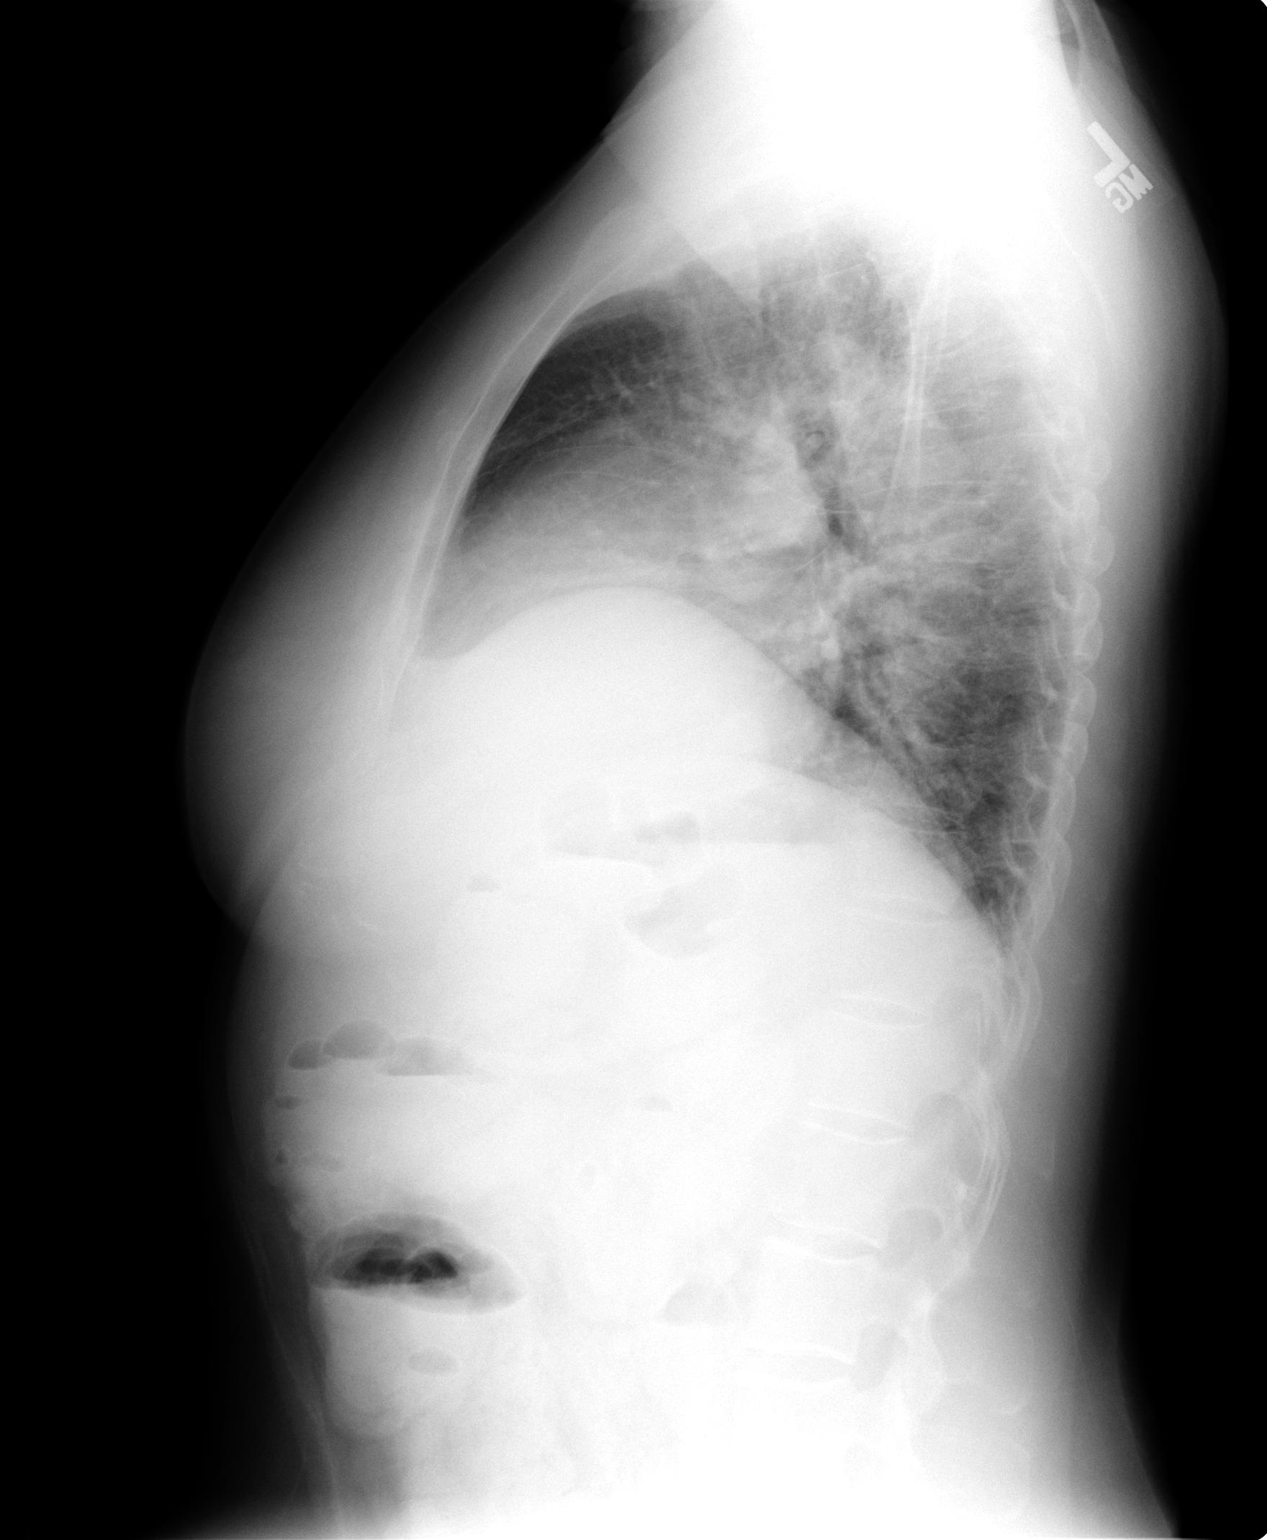

[view not recorded (3 of 3)]
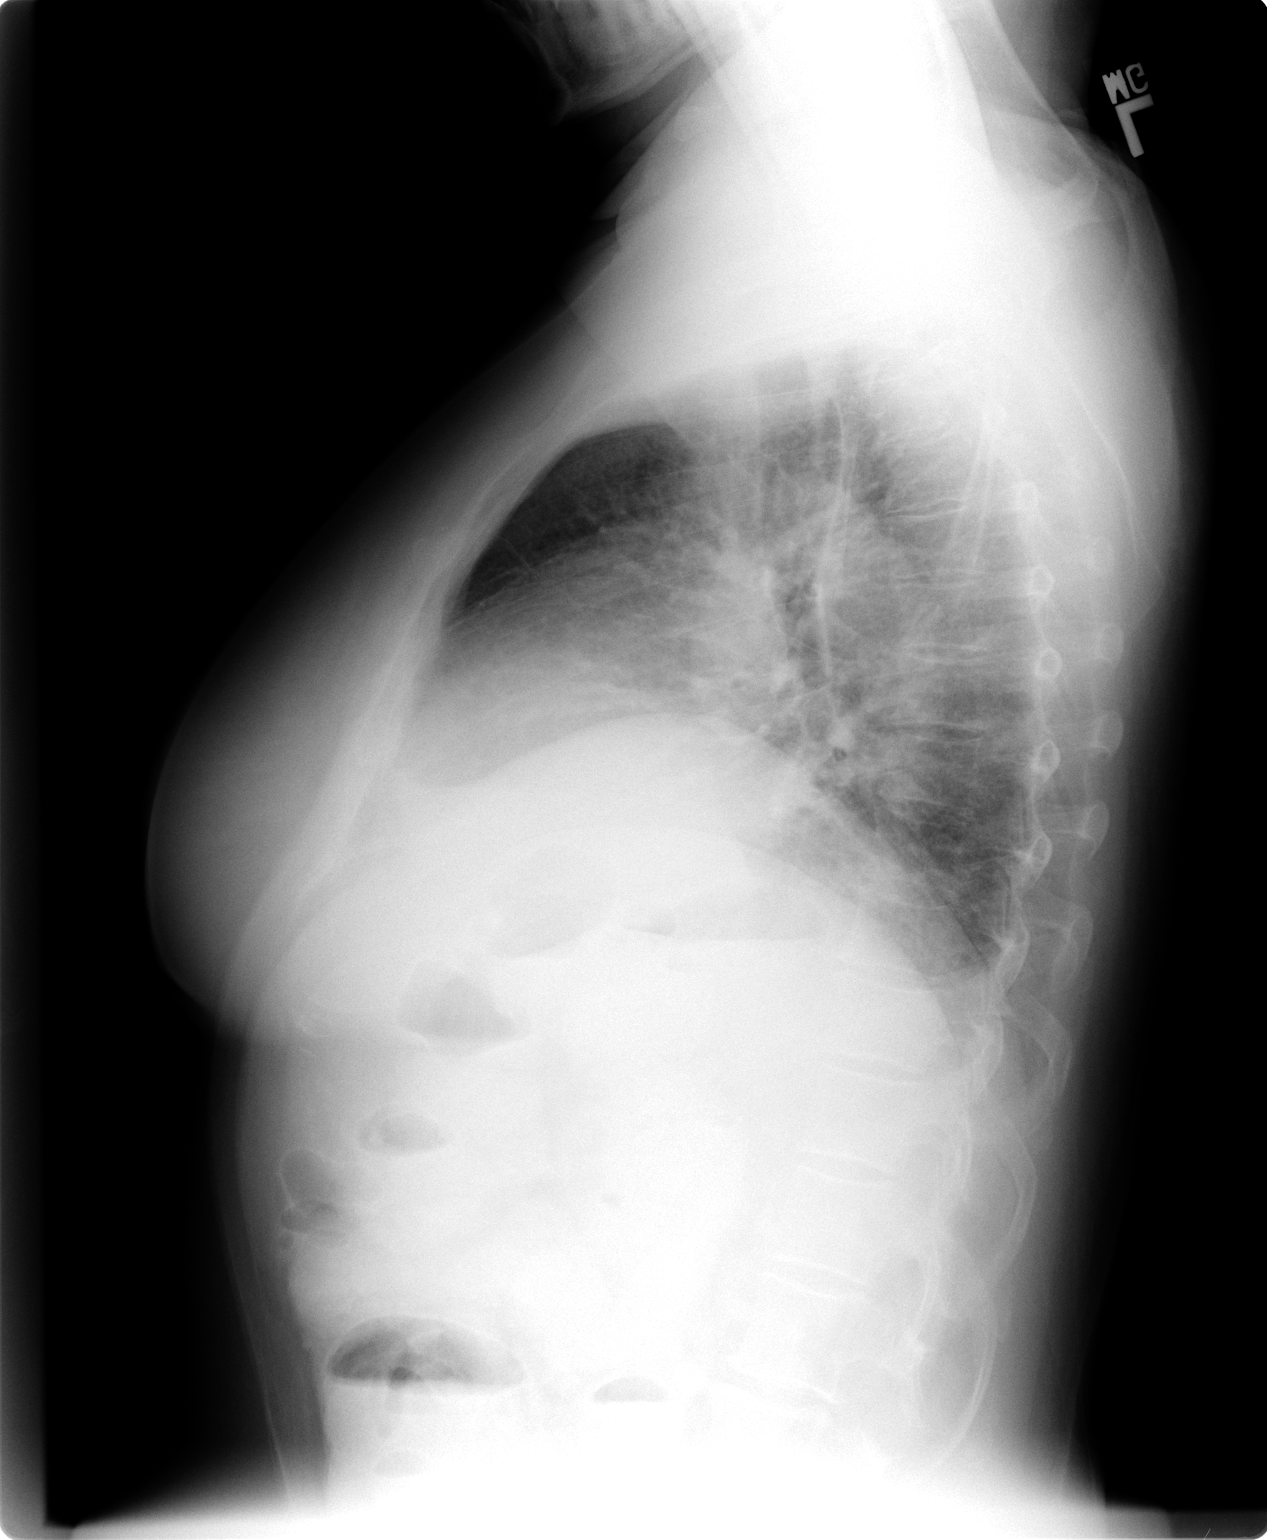

[3 of 3 positions shown; findings below may reference images not displayed]

FINDINGS: The lungs are clear.  Heart is within normal limits in
size.  No bony abnormality is seen.
IMPRESSION: Stable chest x-ray.  No active lung disease.

## 2007-11-27 IMAGING — CT CT ABDOMEN W/ CM
2 of 5 series · 17 of 46 positions shown, 19 images · IV contrast (READICAT/WATER & [ID] OMNI 300)
Comparison: CT abdomen pelvis of [DATE]

CT ABDOMEN

Addendum Begins

The small indeterminate left medial  renal lesion noted on prior
CTs appears completely stable in the interval.
Addendum Ends
CLINICAL DATA: Colon carcinoma evaluate for metastatic involvement
CT ABDOMEN AND PELVIS WITH CONTRAST
TECHNIQUE: Multidetector CT imaging of the abdomen and pelvis was
performed using the standard protocol following bolus
administration of intravenous contrast.
Contrast: 100 ml [SB]

[Series 3: routine abdomen · axial · 0.75mm/px · z∈[-327,+48]mm · 14 of 85 slices shown, 16 images]
[im 5/85  soft-tissue]
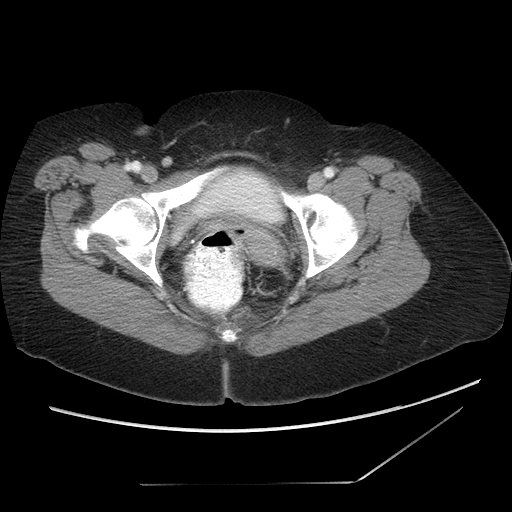
[im 5/85  bone]
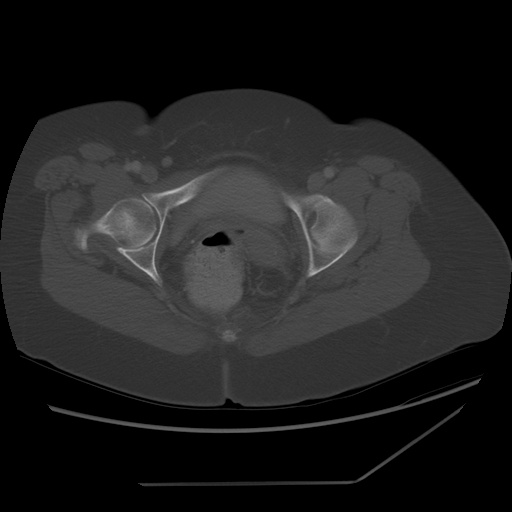
[im 9/85  soft-tissue]
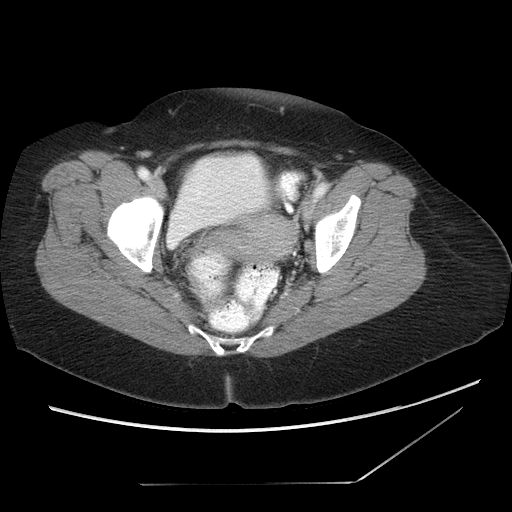
[im 18/85  soft-tissue]
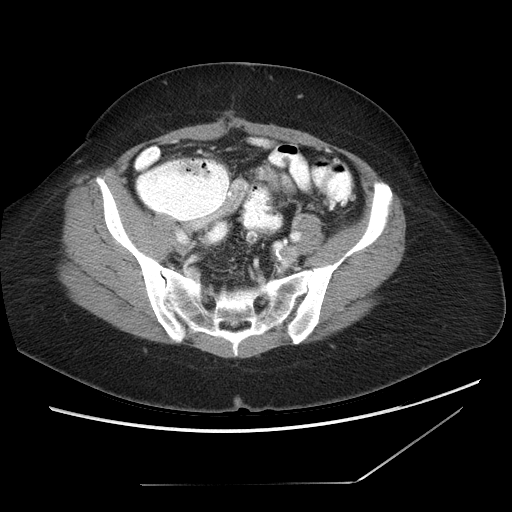
[im 23/85  soft-tissue]
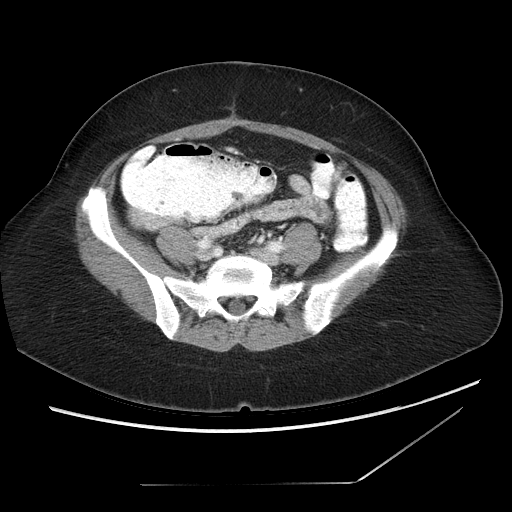
[im 27/85  soft-tissue]
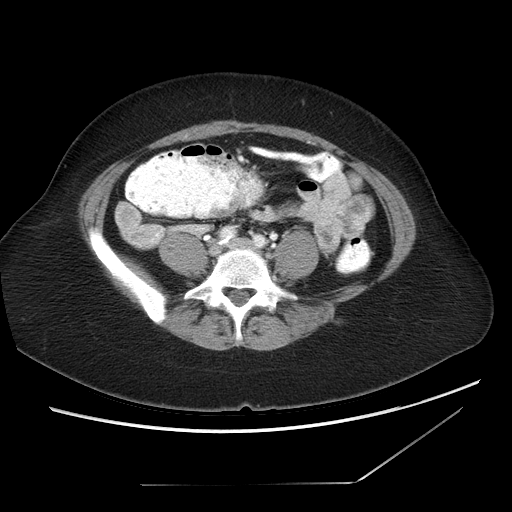
[im 36/85  soft-tissue]
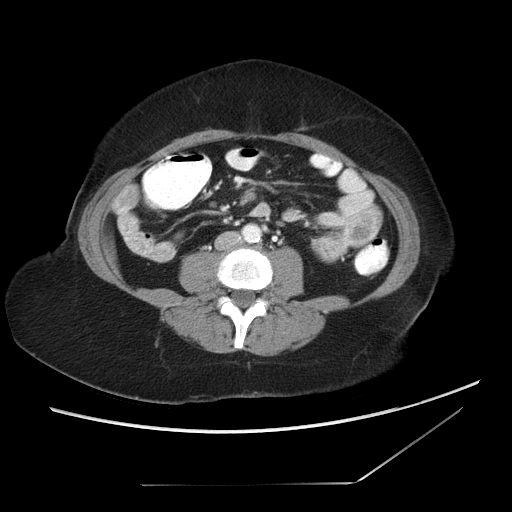
[im 40/85  soft-tissue]
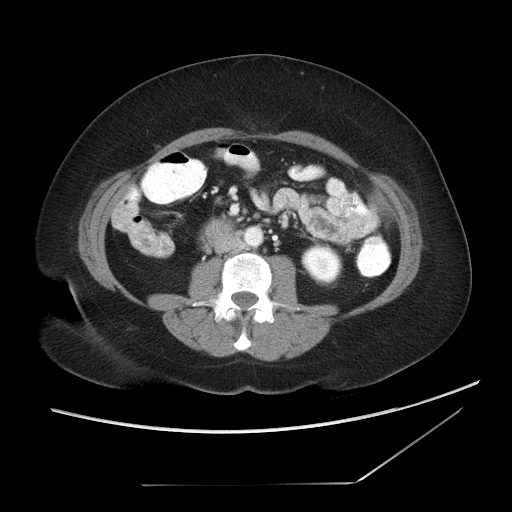
[im 45/85  soft-tissue]
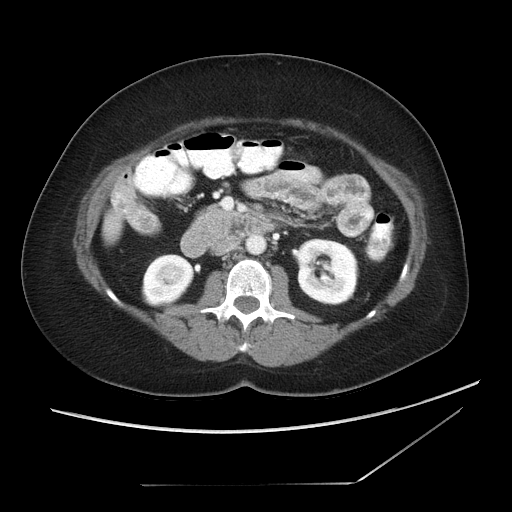
[im 49/85  soft-tissue]
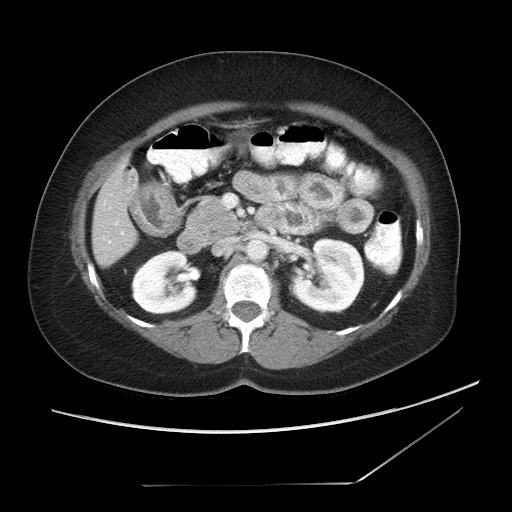
[im 49/85  bone]
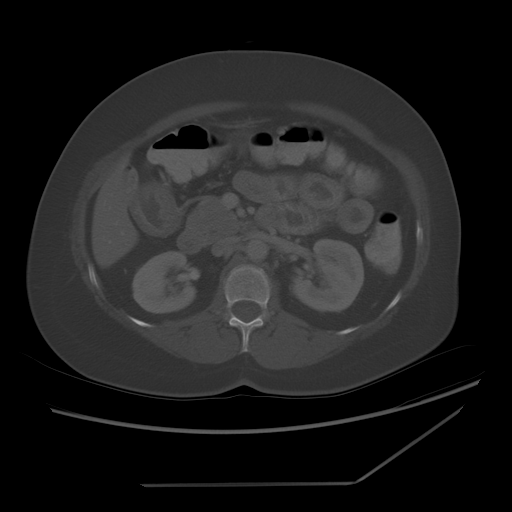
[im 58/85  soft-tissue]
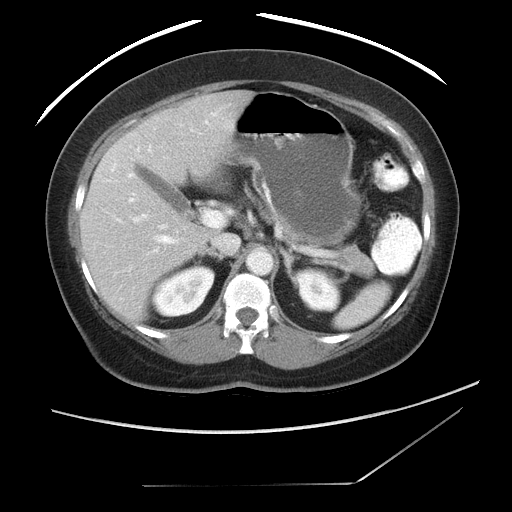
[im 62/85  soft-tissue]
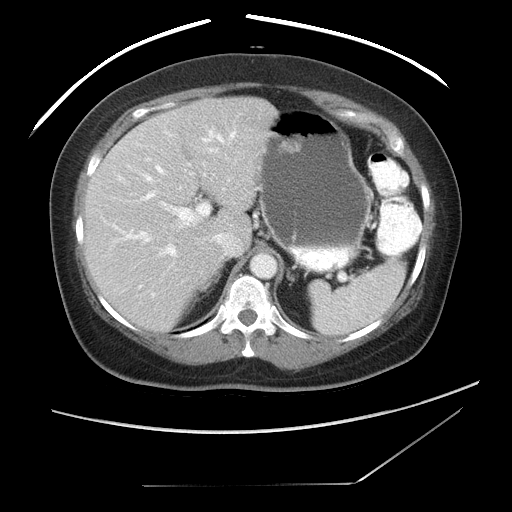
[im 67/85  soft-tissue]
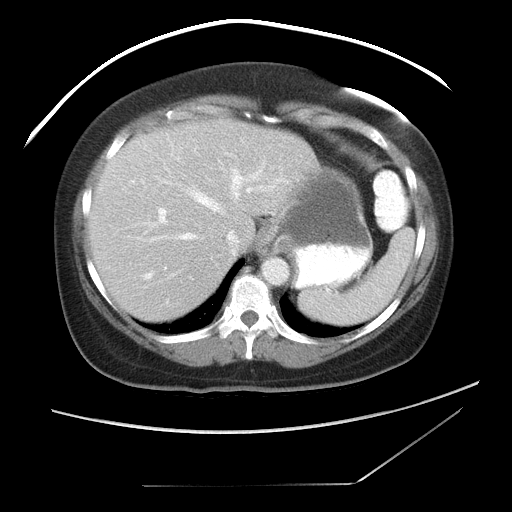
[im 76/85  soft-tissue]
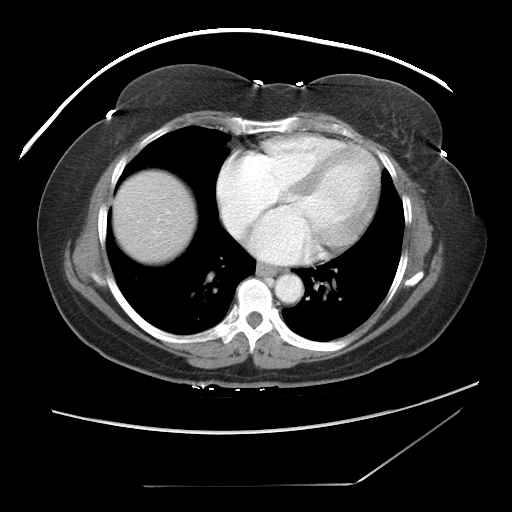
[im 80/85  soft-tissue]
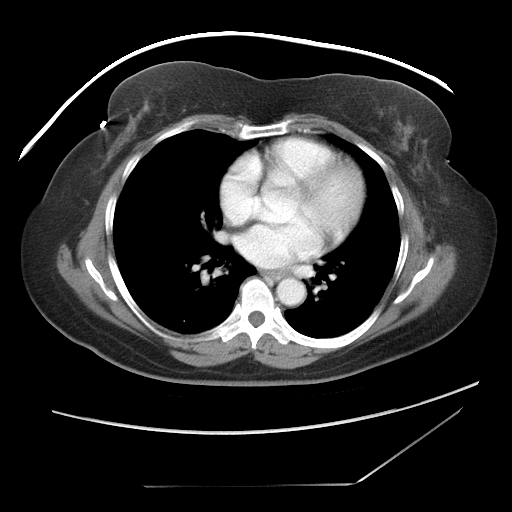

[Series 602: sagittal body · sagittal · 0.90mm/px · 3 of 155 slices shown]
[im 52/155  soft-tissue]
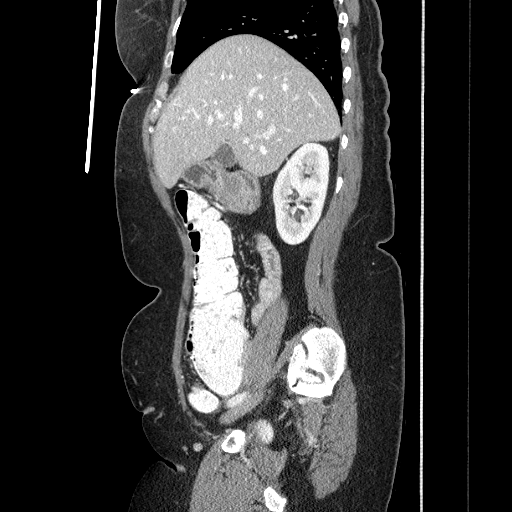
[im 69/155  soft-tissue]
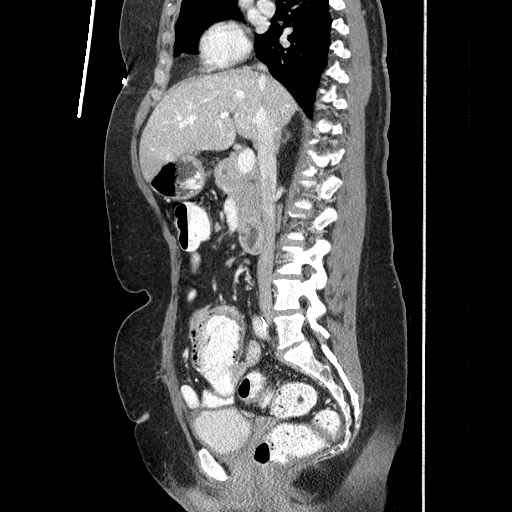
[im 86/155  soft-tissue]
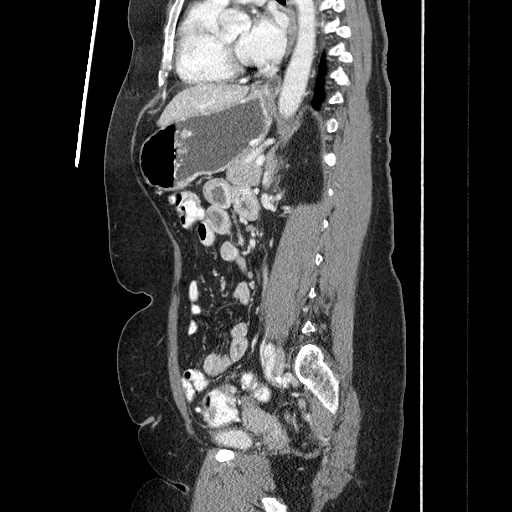

[17 of 46 positions shown; findings below may reference images not displayed]

FINDINGS: The lung bases are clear.  There is a small hiatal
hernia present.  The liver enhances with no focal abnormality and
no ductal dilatation is seen.  No calcified gallstones are noted.
The pancreas is normal in size and the pancreatic duct is not
dilated.  The adrenal glands and spleen appear normal.  The kidneys
enhance delayed images the pelvocaliceal systems appear normal.
The abdominal aorta is normal in caliber.  No adenopathy is seen.
Tiny midline abdominal wall defect is noted containing only a small
amount of fat, and is unchanged.
IMPRESSION: 1. No metastatic disease.  No adenopathy.  CT abdomen is stable.
2.  Small hiatal hernia.

CT PELVIS
FINDINGS: No colonic abnormality is seen.  The uterus is normal in
size.  No adnexal lesion is noted.  There are sigmoid colon
diverticula present.  No fluid or adenopathy is seen. No bony
abnormality is seen.
IMPRESSION: Negative CT the pelvis.  No evidence of recurrence of colon
carcinoma.

## 2008-05-29 ENCOUNTER — Ambulatory Visit: Payer: Self-pay | Admitting: Oncology

## 2008-06-04 LAB — CEA: CEA: 1.6 ng/mL (ref 0.0–5.0)

## 2008-11-20 ENCOUNTER — Ambulatory Visit: Payer: Self-pay | Admitting: Oncology

## 2008-11-24 LAB — COMPREHENSIVE METABOLIC PANEL
Albumin: 3.6 g/dL (ref 3.5–5.2)
Alkaline Phosphatase: 143 U/L — ABNORMAL HIGH (ref 39–117)
BUN: 9 mg/dL (ref 6–23)
CO2: 34 mEq/L — ABNORMAL HIGH (ref 19–32)
Calcium: 9.1 mg/dL (ref 8.4–10.5)
Chloride: 102 mEq/L (ref 96–112)
Glucose, Bld: 83 mg/dL (ref 70–99)
Potassium: 3.2 mEq/L — ABNORMAL LOW (ref 3.5–5.3)
Sodium: 141 mEq/L (ref 135–145)
Total Protein: 7.6 g/dL (ref 6.0–8.3)

## 2008-11-27 ENCOUNTER — Encounter: Admission: RE | Admit: 2008-11-27 | Discharge: 2008-11-27 | Payer: Self-pay | Admitting: Oncology

## 2008-11-27 IMAGING — CT CT ABDOMEN W/ CM
3 of 5 series · 13 of 36 positions shown, 19 images · IV contrast (READICAT/WATER & [ID] OMNI 300)
Comparison: CT abdomen pelvis of [DATE]

CT ABDOMEN

CLINICAL DATA: :  History of colon carcinoma, follow-up

CT ABDOMEN AND PELVIS WITH CONTRAST
TECHNIQUE: Multidetector CT imaging of the abdomen and pelvis was
performed using the standard protocol following bolus
administration of intravenous contrast.
Contrast: 100 ml [KG]

[Series 3: routine abdomen · axial · 0.70mm/px · z∈[-277,+23]mm · 7 of 82 slices shown, 12 images]
[im 11/82  soft-tissue]
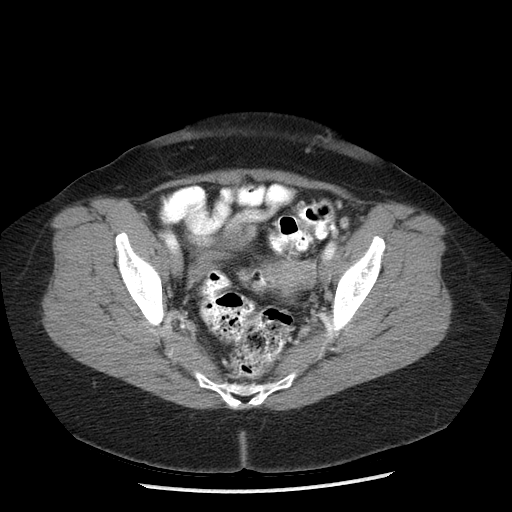
[im 11/82  bone]
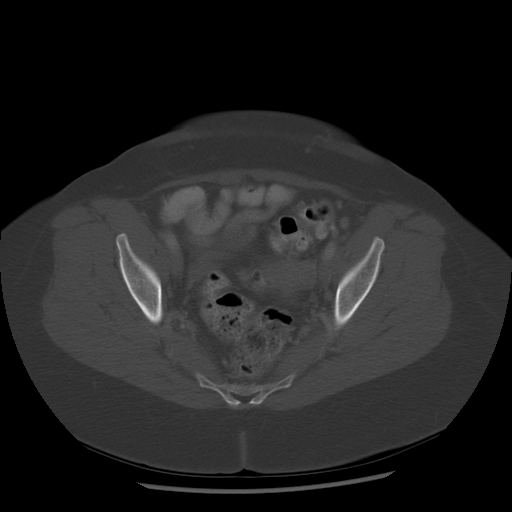
[im 21/82  soft-tissue]
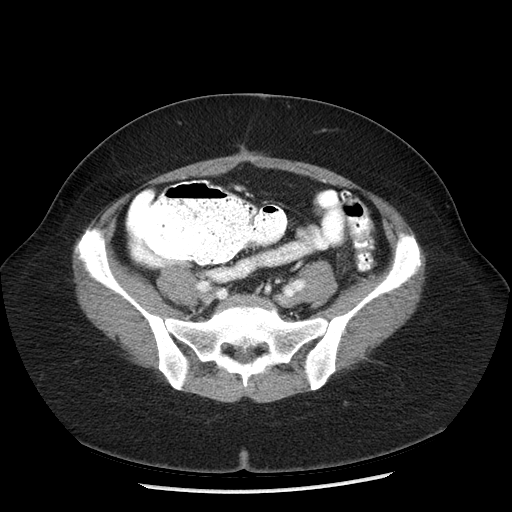
[im 31/82  soft-tissue]
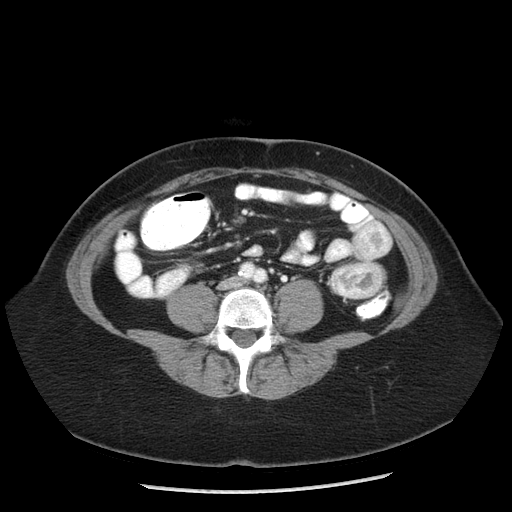
[im 41/82  soft-tissue]
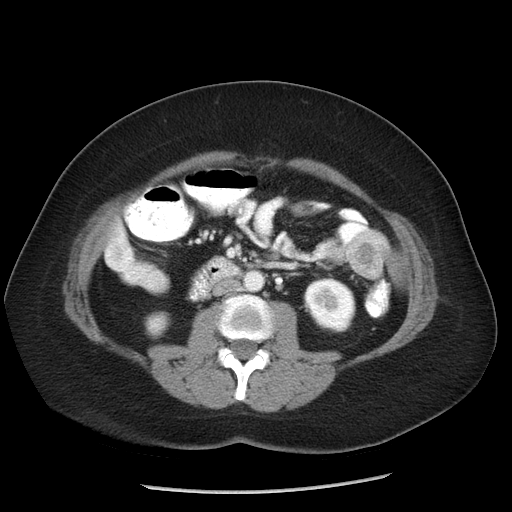
[im 41/82  lung]
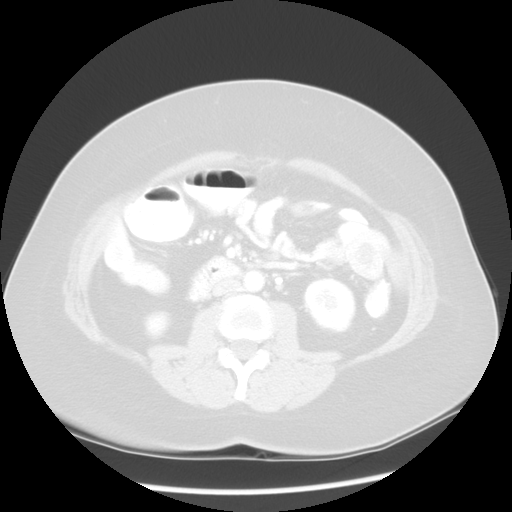
[im 51/82  soft-tissue]
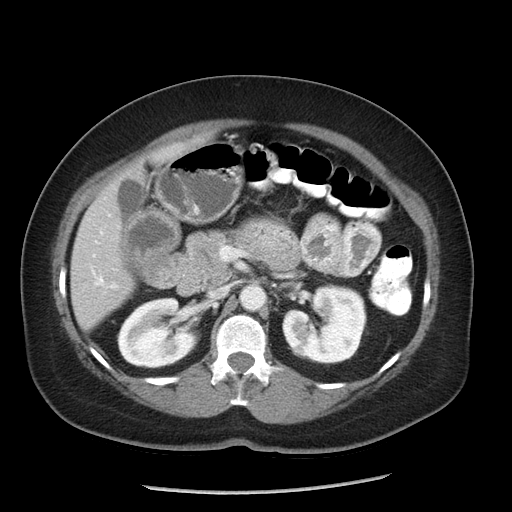
[im 51/82  lung]
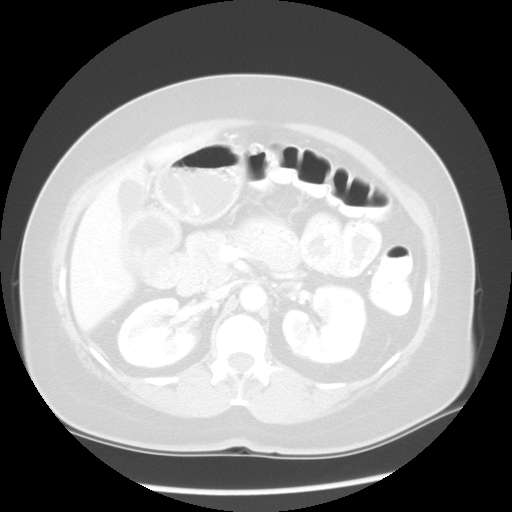
[im 61/82  soft-tissue]
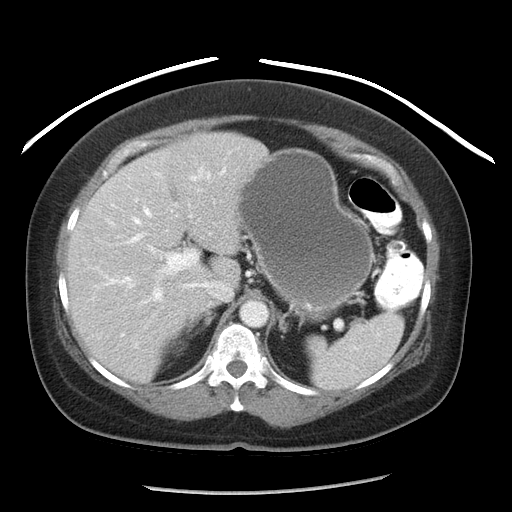
[im 61/82  lung]
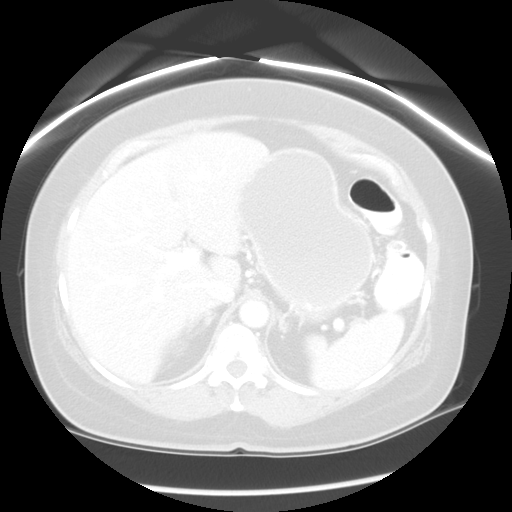
[im 71/82  soft-tissue]
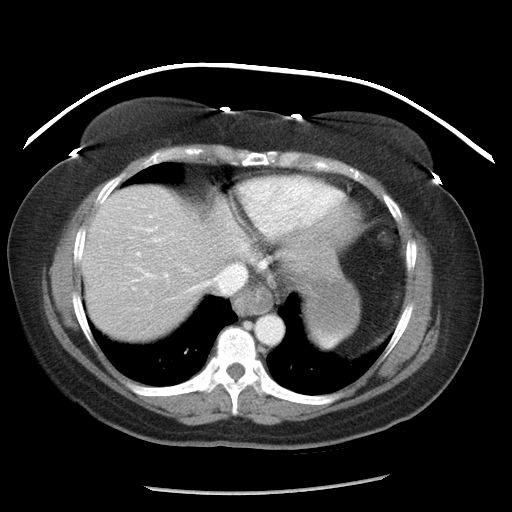
[im 71/82  lung]
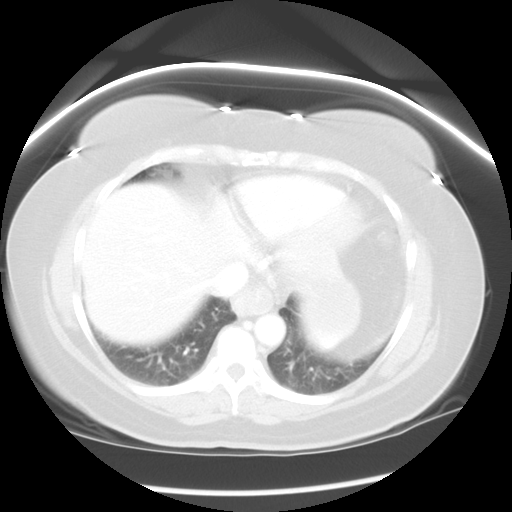

[Series 601: coronal body · coronal · 0.89mm/px · 1 of 115 slices shown, 2 images]
[im 39/115  soft-tissue]
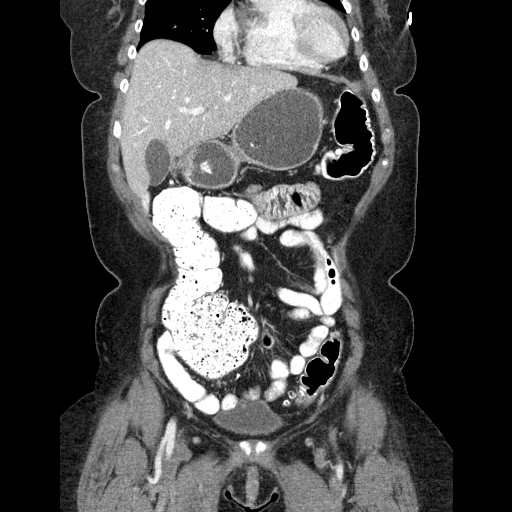
[im 39/115  bone]
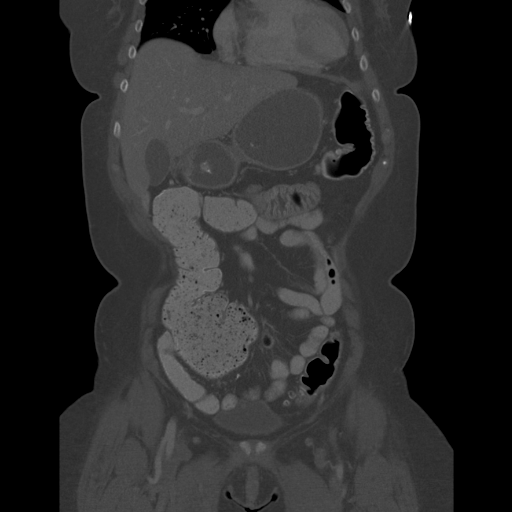

[Series 602: sagittal body · sagittal · 0.89mm/px · 5 of 145 slices shown]
[im 10/145  soft-tissue]
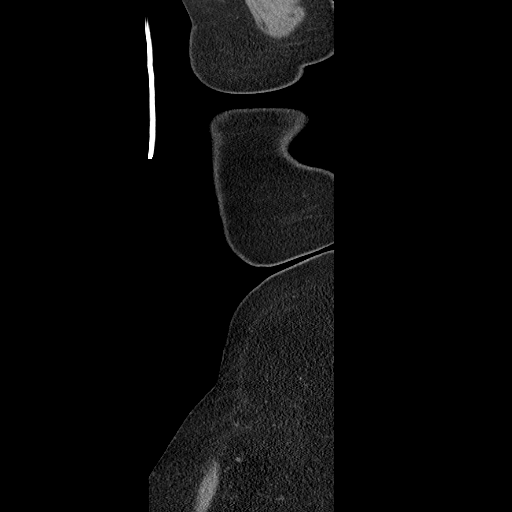
[im 28/145  soft-tissue]
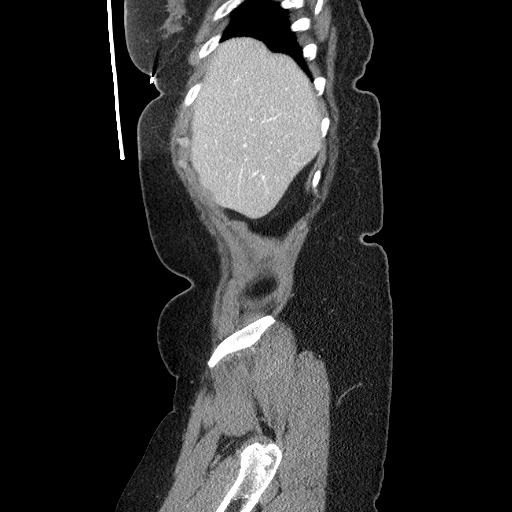
[im 46/145  soft-tissue]
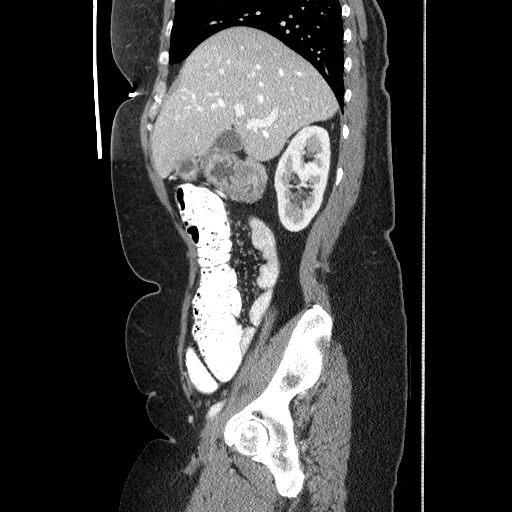
[im 64/145  soft-tissue]
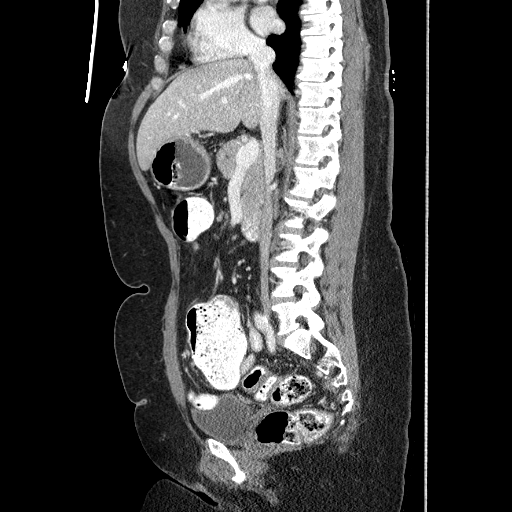
[im 82/145  soft-tissue]
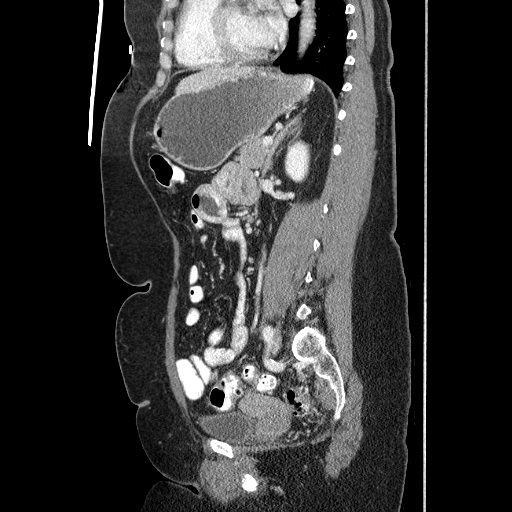

[13 of 36 positions shown; findings below may reference images not displayed]

FINDINGS: The lung bases are clear.  The liver enhances with no
evidence of metastatic involvement.  No intrahepatic ductal
dilatation is seen and the gallbladder is well with visualized and
no calcified gallstones are seen.  There is a small hiatal hernia
present.  The pancreas is normal in size and the pancreatic duct
remains minimally prominent.  The adrenal glands appear symmetrical
and normal and the spleen is stable in size.  The kidneys enhance
with no evidence of calculi, mass, or hydronephrosis.  The tiny
indeterminate slightly exophytic left mid renal lesion posterior
medially is stable.  No new renal abnormality is seen.  The
abdominal aorta is normal in caliber.  Small mesenteric lymph nodes
are stable in size.
IMPRESSION: 1.  No evidence of metastatic involvement abdomen.  Stable small
mesenteric nodes.
2.  Stable small left slightly exophytic renal lesion.
3.  Small hiatal hernia.

CT PELVIS
FINDINGS: The urinary bladder is unremarkable.  The uterus is
normal in size.  No adnexal lesion is seen.  No fluid is seen
within the pelvis.  There are scattered rectosigmoid colonic
diverticula present.  The cecum is low in position within the
pelvis and the anastomoses of distal ileum with right colon appears
patent.  No evidence of local recurrence of colon carcinoma is
seen. No bony abnormality is seen.
IMPRESSION: 1.  No evidence of recurrence of carcinoma.
 2.  Sigmoid colon diverticula.

## 2009-01-04 ENCOUNTER — Encounter: Admission: RE | Admit: 2009-01-04 | Discharge: 2009-01-04 | Payer: Self-pay | Admitting: Family Medicine

## 2009-01-04 IMAGING — CR DG SHOULDER 2+V*R*
3 series · 3 of 3 positions shown · non-contrast
Comparison: None

CLINICAL DATA: Fell - anterior shoulder pain

RIGHT SHOULDER - 2+ VIEW

[w shoulder ap internal righ]
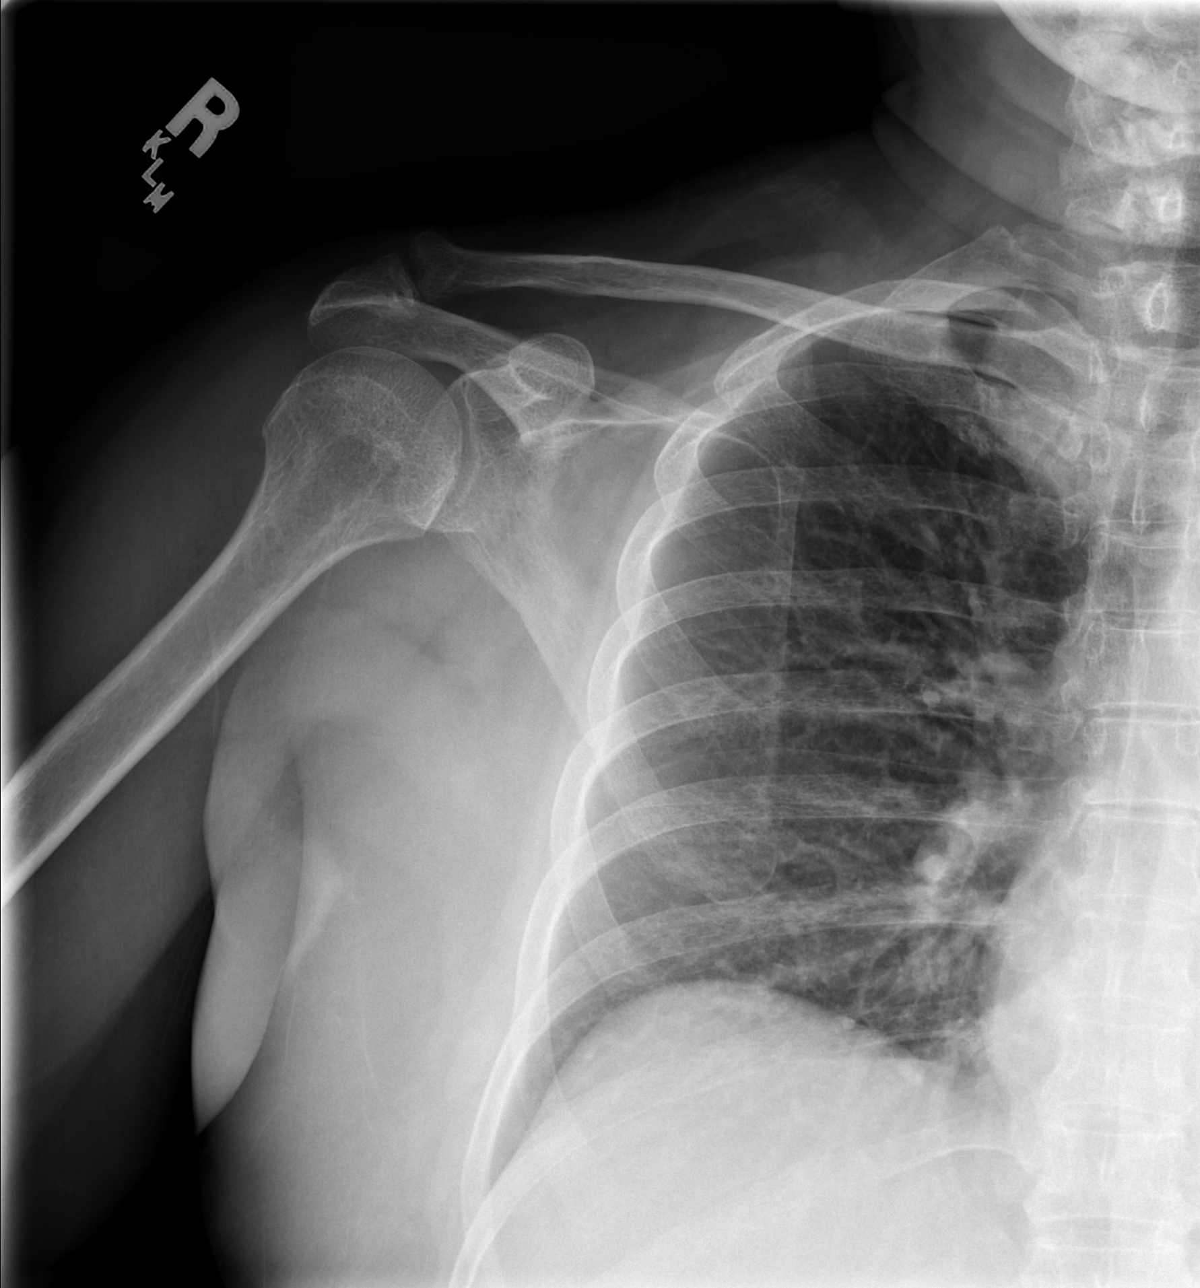

[w shoulder ap external righ]
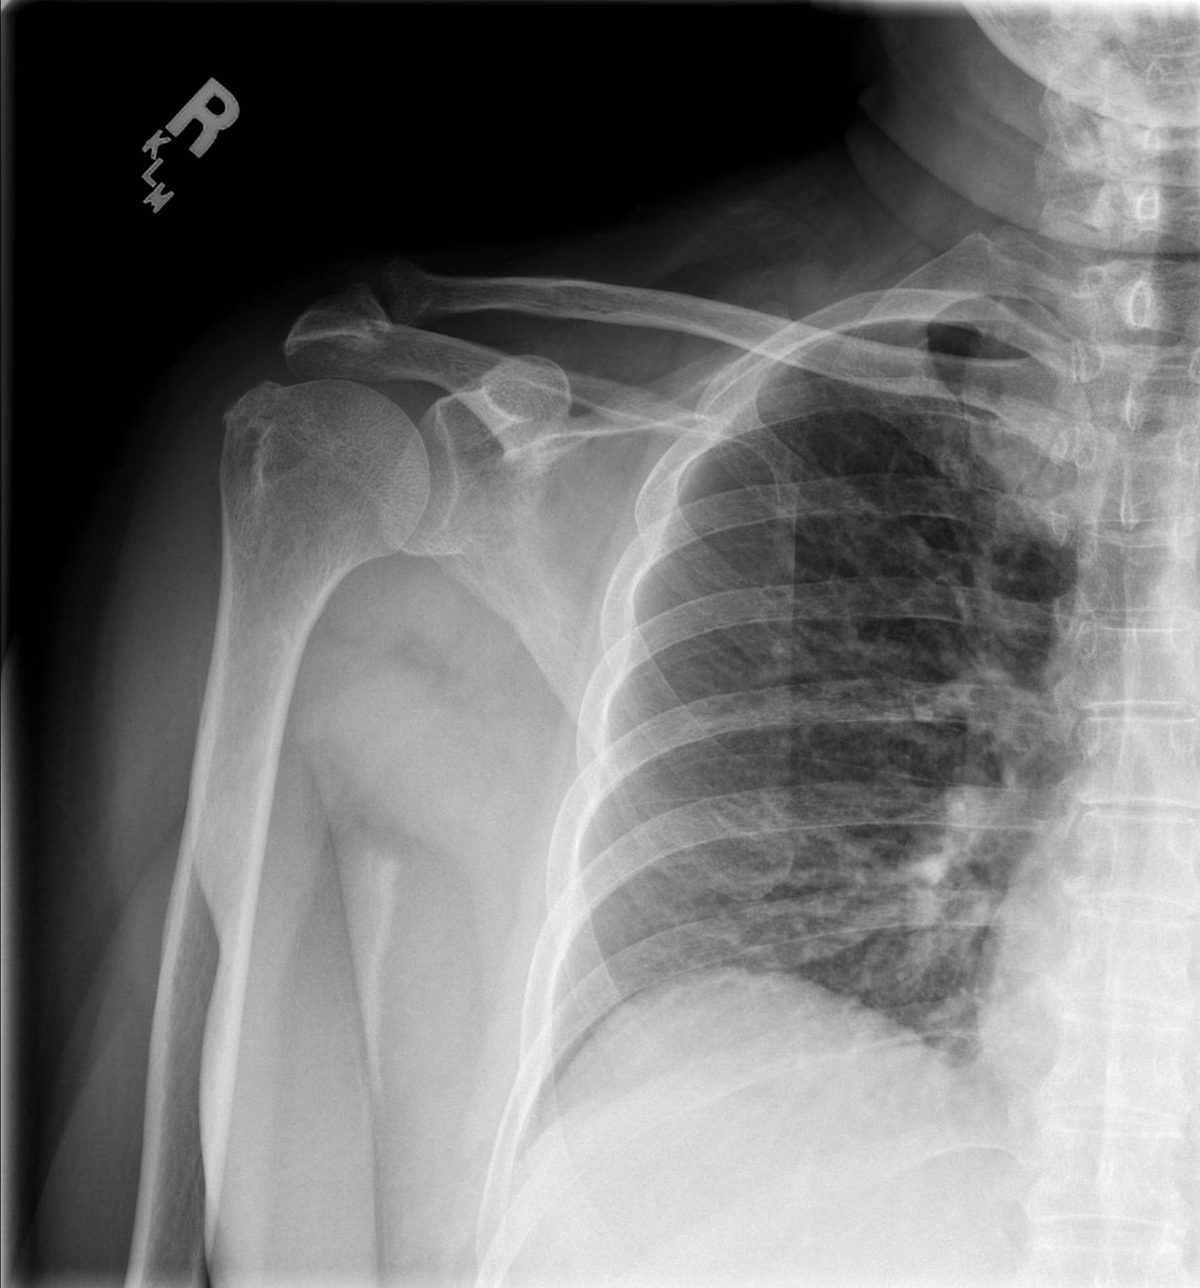

[w shoulder y view right]
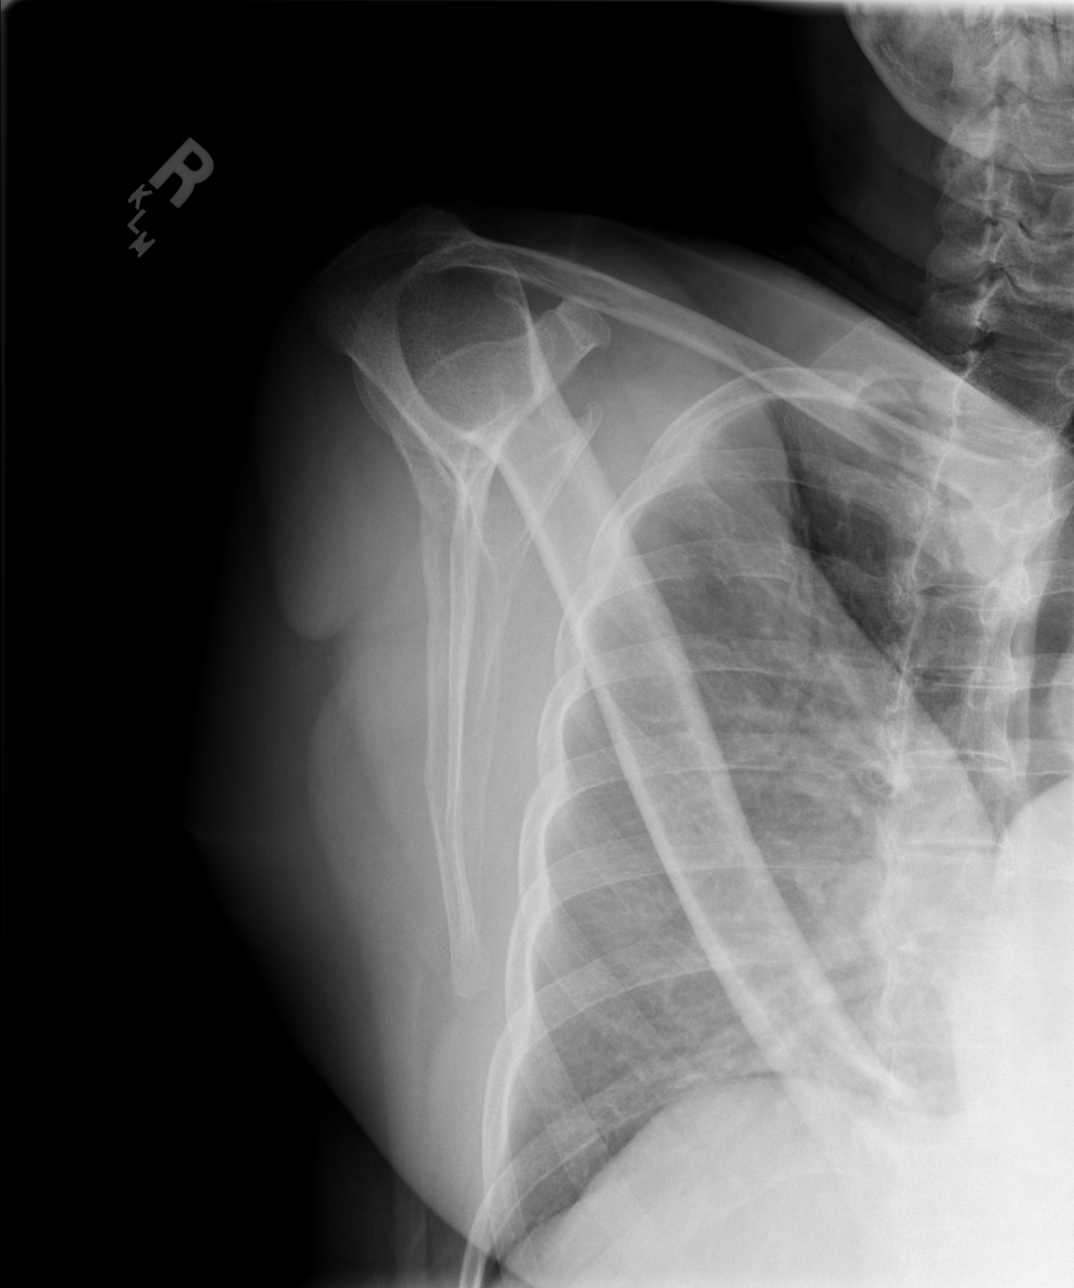

[3 of 3 positions shown; findings below may reference images not displayed]

FINDINGS: There are moderate degenerative changes of the AC joint,
and a spur-like change along the greater tuberosity of the humerus.
No acute findings.  No soft tissue calcifications.
IMPRESSION: Degenerative changes as above - no acute findings.

## 2009-05-31 ENCOUNTER — Ambulatory Visit: Payer: Self-pay | Admitting: Oncology

## 2009-11-29 ENCOUNTER — Ambulatory Visit: Payer: Self-pay | Admitting: Oncology

## 2010-07-20 ENCOUNTER — Encounter
Admission: RE | Admit: 2010-07-20 | Discharge: 2010-07-20 | Payer: Self-pay | Source: Home / Self Care | Attending: Family Medicine | Admitting: Family Medicine

## 2010-07-20 IMAGING — MG MM DIGITAL SCREENING
4 series · 4 of 4 positions shown · non-contrast
Comparison: none

DG SCREEN MAMMOGRAM BILATERAL
Bilateral CC and MLO view(s) were taken.

DIGITAL SCREENING MAMMOGRAM WITH CAD:
There are scattered fibroglandular densities.  No masses or malignant type calcifications are 
identified.
Images were processed with CAD.

[R CC]
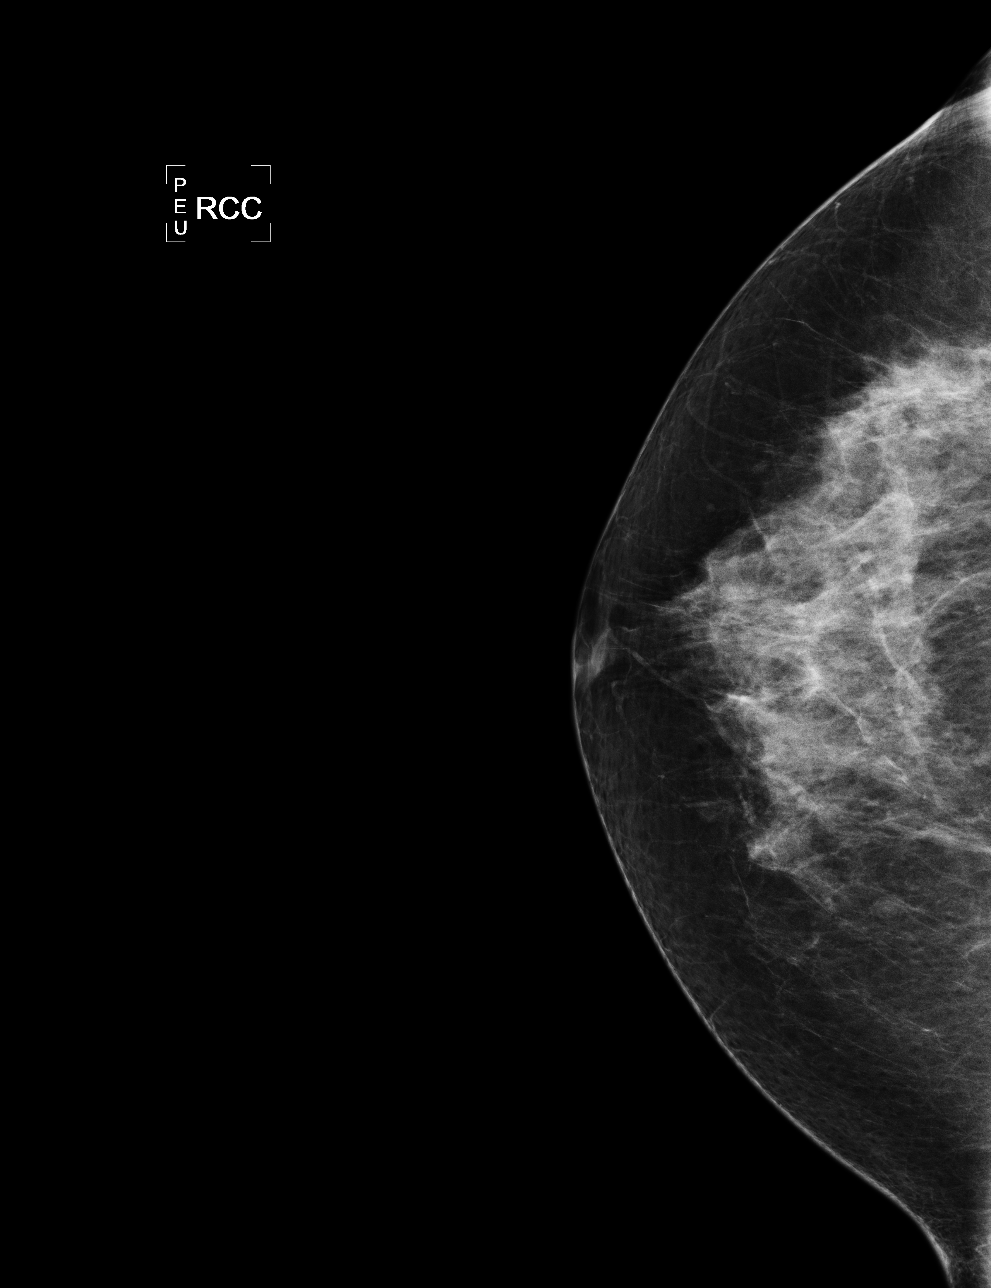

[L CC]
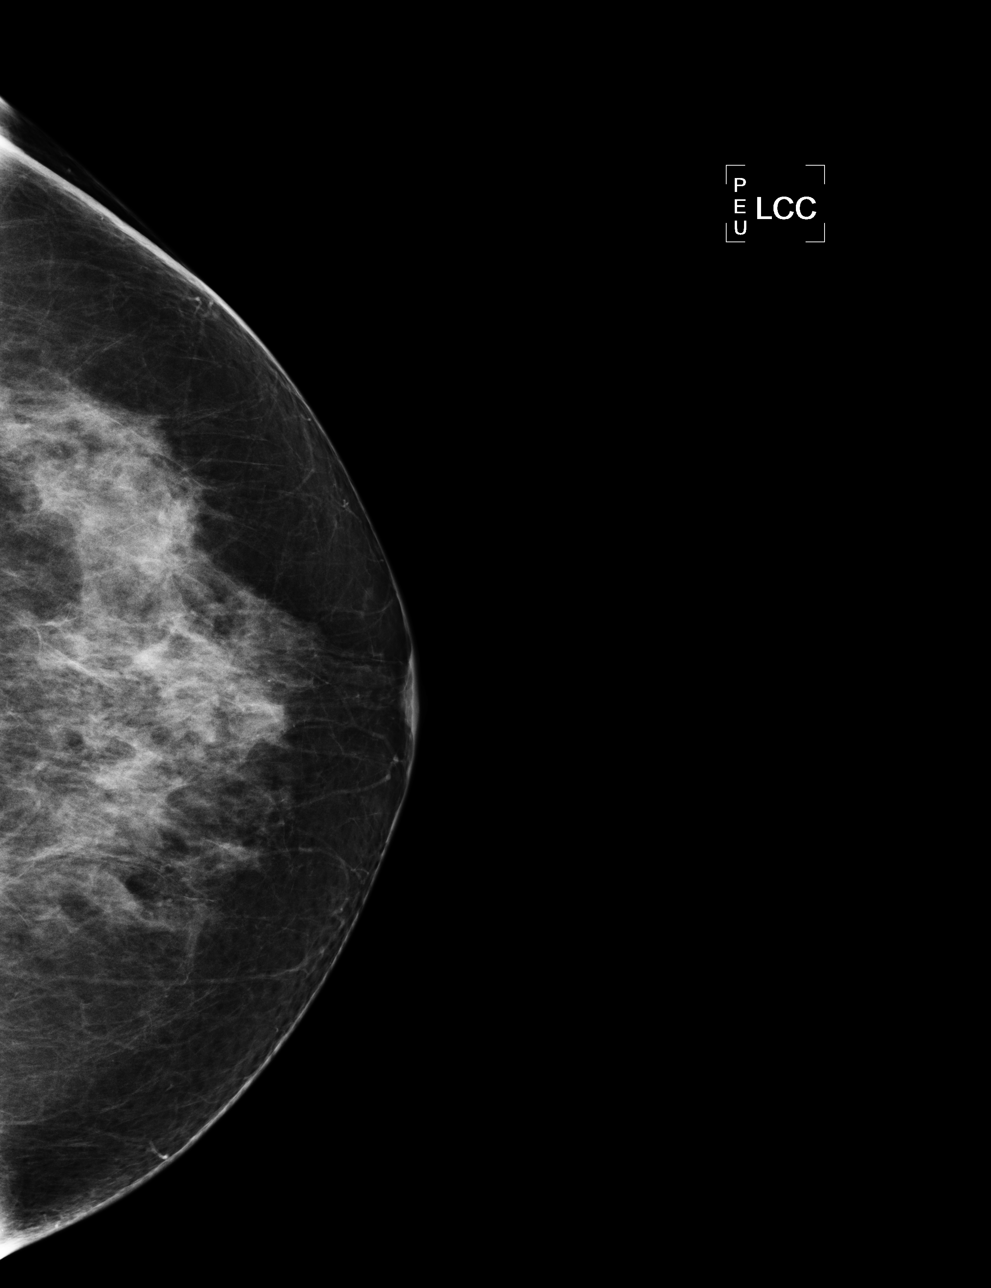

[L MLO]
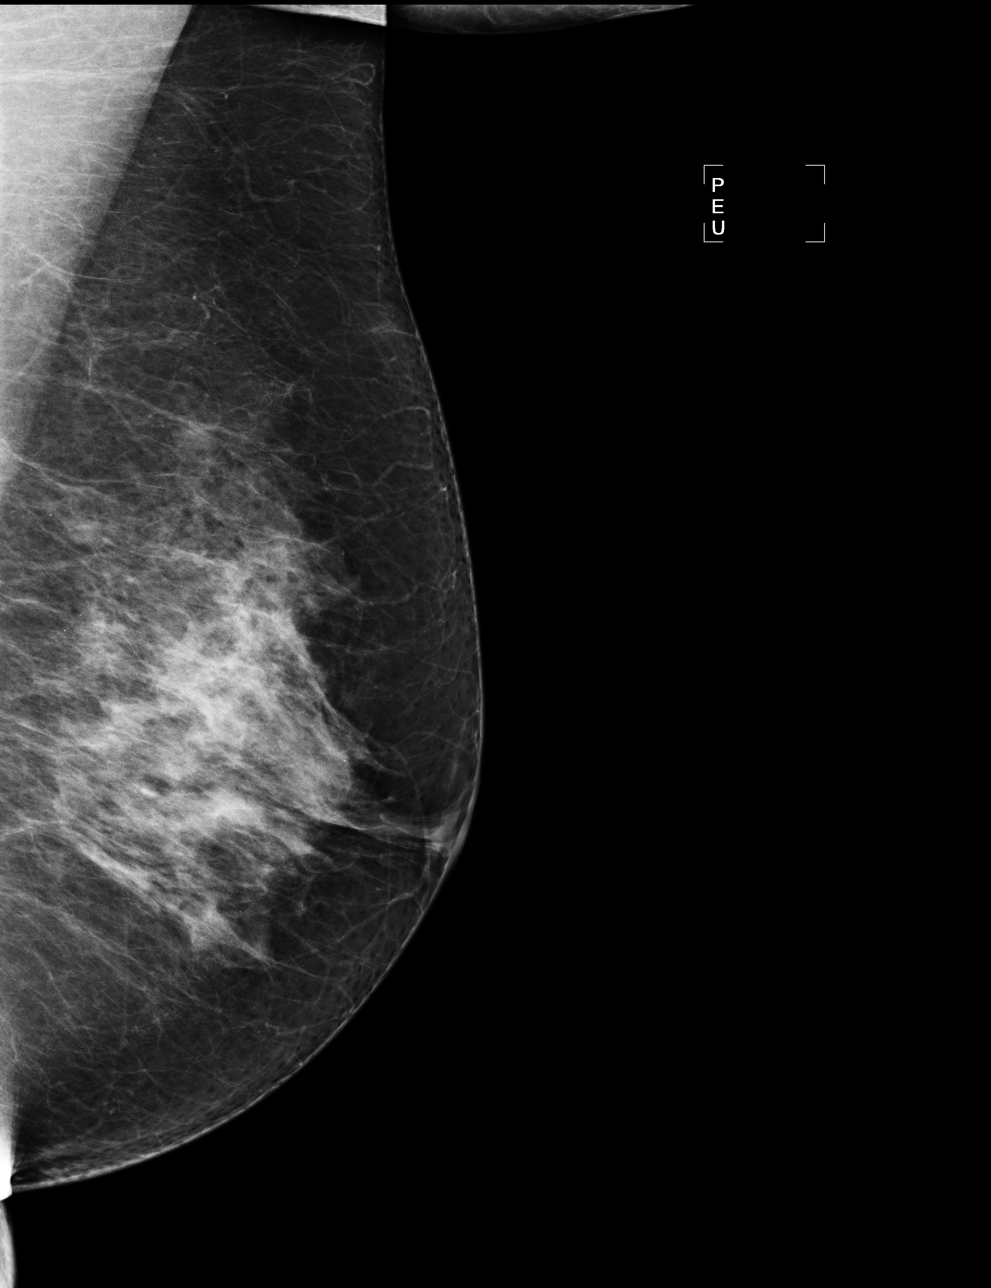

[R MLO]
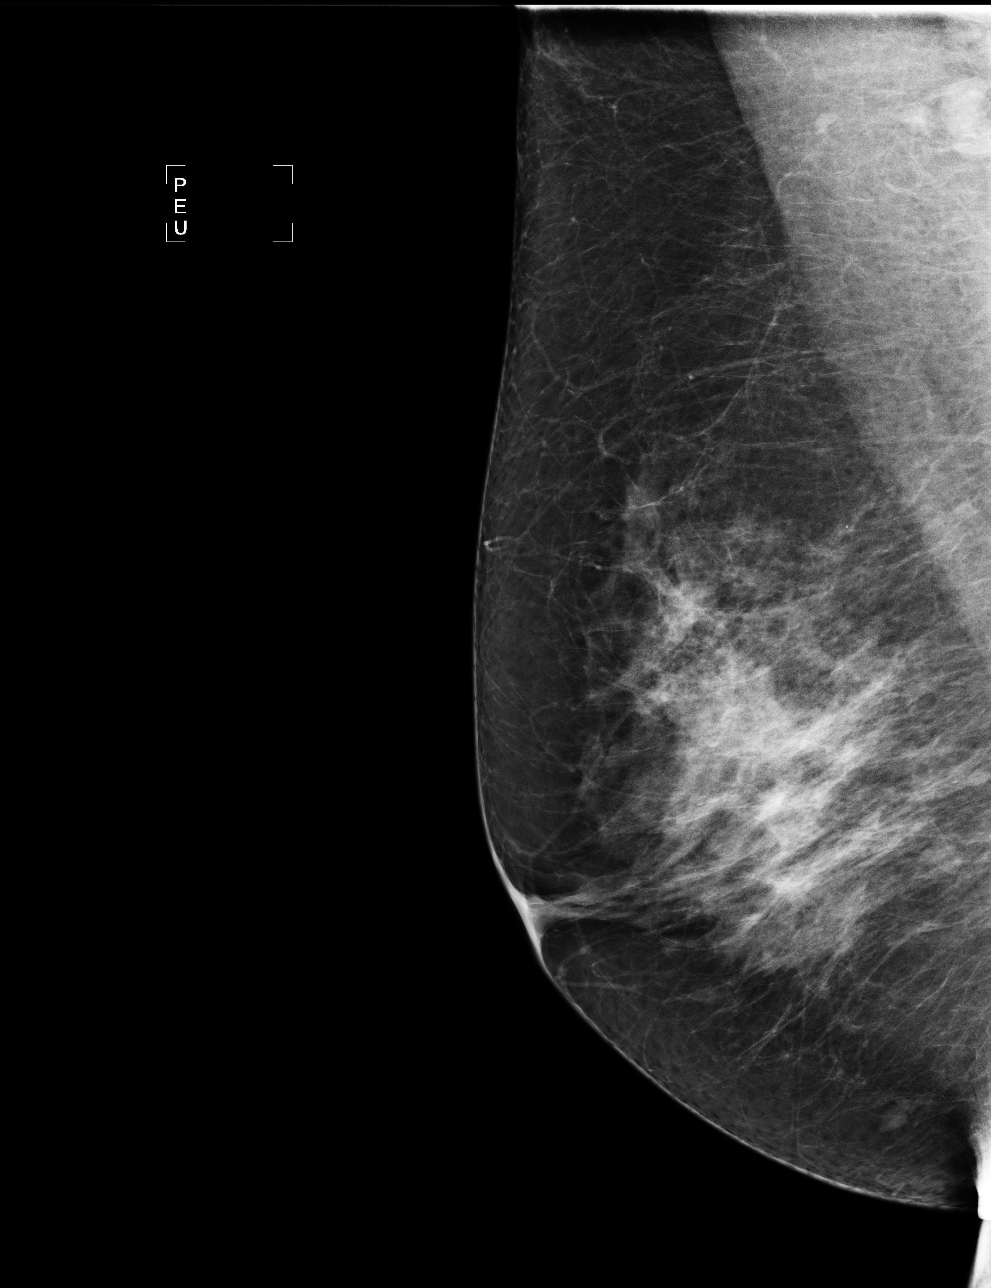

[4 of 4 positions shown; findings below may reference images not displayed]

IMPRESSION: No specific mammographic evidence of malignancy.  Next screening mammogram is recommended in one 
year.

A result letter of this screening mammogram will be mailed directly to the patient.

ASSESSMENT: Negative - BI-RADS 1

Screening mammogram in 1 year.
,

## 2010-08-06 ENCOUNTER — Encounter: Payer: Self-pay | Admitting: Oncology

## 2010-08-07 ENCOUNTER — Encounter: Payer: Self-pay | Admitting: Family Medicine

## 2010-08-07 ENCOUNTER — Encounter: Payer: Self-pay | Admitting: Oncology

## 2010-12-02 NOTE — H&P (Signed)
[place]. [facility]  Patient:    Kathy Scott, Kathy Scott. Visit Number: [id number] MRN: [id number]          Service Type: DSU Location: [facility] Attending Physician:  Cherylynn Ridges Dictated by:   Jimmye Norman, M.D. Admit Date:  12/05/2001 Discharge Date: 12/05/2001   CC:         Merlene Laughter. Renae Gloss, M.D.   History and Physical  DATE OF BIRTH:  11-19-1950  IDENTIFICATION AND CHIEF COMPLAINT:  The patient is a 60 year old female with recent diagnosis and treatment for right colon cancer, who now comes in for Port-A-Cath or a MediPort placement.  HISTORY OF PRESENT ILLNESS:  I last saw Ms. Swan-Jones back on May 6th of 2003, at which time we were still evaluating her for possible postoperative wound infection, however, this seemed to have resolved.  She had an appointment to see oncology, Dr. Jillyn Hidden B. Sherrill, for consideration of postoperative chemotherapy.  It was decided at that point that she does require placement of a port for chemotherapy and she is now coming in for placement of that port.  PAST MEDICAL HISTORY:  Her past medical history is significant for hypertension, for which she takes Norvasc 25 mg a day, hydrochlorothiazide one tablet a day; she also has hypothyroidism, for which she takes Synthroid 0.5 mg a day, and epilepsy, for which she takes Dilantin 100 mg four times a day.  PAST SURGICAL HISTORY:  She has had the colectomy in the last month, a tubal ligation in 1982, foot and ankle surgery back in 1985.  REVIEW OF SYSTEMS:  Unremarkable recently.  PHYSICAL EXAMINATION:  GENERAL:  Examination relative to her current problem is unremarkable.  MUSCULOSKELETAL:  She has a normal -- what appears to be -- chest and cervical anatomy.  ABDOMEN:  Her wound from her surgery appears to be healing well, although there is an induration in the lower one-third of the wound; it does not appear to be infected.  IMPRESSION:  Colon  carcinoma in an otherwise healthy 60 year old woman with need for chemotherapy.  PLAN:  Plan is to place a MediPort likely on her left side for chemotherapy. Dictated by:   Jimmye Norman, M.D. Attending Physician:  Cherylynn Ridges DD:  12/05/01 TD:  12/06/01 Job: [id number]

## 2010-12-02 NOTE — Procedures (Signed)
[place]. [facility]  Patient:    Kathy Scott, Kathy Scott Visit Number: [id number] MRN: [id number]          Service Type: MED Location: 3000 3030 01 Attending Physician:  Alva Garnet. Dictated by:   Anselmo Rod, M.D. Proc. Date: 10/30/01 Admit Date:  10/28/2001   CC:         Cala Bradford R. Renae Gloss, M.D.  Jimmye Norman, M.D.   Procedure Report  DATE OF BIRTH:  05/24/1951.  PROCEDURE:  Esophagogastroduodenoscopy.  ENDOSCOPIST:  Anselmo Rod, M.D.  INSTRUMENT USED:  Olympus video panendoscope.  INDICATION FOR PROCEDURE:  Severe iron deficiency anemia with hemoglobin down to 6.4 g/dl in a 60 year old African-American female with a family history of breast cancer and colon cancer.  Rule out peptic ulcer disease, esophagitis, gastritis, etc.  PREPROCEDURE PREPARATION:  Informed consent was procured from the patient. The patient was fasted for eight hours prior to the procedure.  PREPROCEDURE PHYSICAL:  VITAL SIGNS:  The patient had stable vital signs.  NECK:  Supple.  CHEST:  Clear to auscultation.  S1, S2 regular.  ABDOMEN:  Soft with normal bowel sounds.  DESCRIPTION OF PROCEDURE:  The patient was placed in the left lateral decubitus position.  No additional sedation was used for the EGD as the patient had just had a colonoscopy and received 100 mg of Demerol and 7.5 mg of Versed for the colonoscopy.  Once the patient was adequately sedate and maintained on low-flow oxygen and continuous cardiac monitoring on her left lateral side, the Olympus video panendoscope was advanced through the mouthpiece, over the tongue, into the esophagus under direct vision.  The esophagus appeared normal with no evidence of ring, stricture, masses, esophagitis, or Barretts mucosa.  The scope was then advanced in the stomach. There was no evidence of a hiatal hernia on retroflexion.  There was moderate atrophic gastritis throughout the gastric mucosa  with antral erythema consistent with antral gastritis.  No ulcers, erosions, masses, or polyps were seen, and the proximal small bowel appeared normal and without lesions.  There was no evidence of outlet obstruction, and no definite source of bleeding could be identified.  IMPRESSION: 1. Normal-appearing esophagus. 2. Atrophic gastritis with antral erythema consistent with antral gastritis. 3. Normal proximal small bowel. 4. No ulcerations or masses seen.  RECOMMENDATIONS: 1. Proceed with right hemicolectomy as discussed with Dr. Lindie Spruce. 2. Continue serial CBCs and transfuse as needed to keep hematocrit above    25%. Dictated by:   Anselmo Rod, M.D. Attending Physician:  Andi Devon R. DD:  10/30/01 TD:  10/30/01 Job: [id number]

## 2010-12-02 NOTE — Op Note (Signed)
NAME:  Kathy Scott, Kathy Scott          ACCOUNT NO.:  [id number]   MEDICAL RECORD NO.:  [id number]          PATIENT TYPE:  AMB   LOCATION:  ENDO                         FACILITY:  [facility]   PHYSICIAN:  Anselmo Rod, M.D.  DATE OF BIRTH:  11/23/1950   DATE OF PROCEDURE:  08/23/2005  DATE OF DISCHARGE:                                 OPERATIVE REPORT   PROCEDURE PERFORMED:  Colonoscopy with cold biopsies x2.   ENDOSCOPIST:  Anselmo Rod, M.D.   INSTRUMENT USED:  Olympus video colonoscope.   INDICATIONS FOR PROCEDURE:  A 60 year old African American female diagnosed  with colon cancer status post right hemicolectomy in 2004 undergoing a  surveillance colonoscopy, rule out colonic polyps, masses, etc.   PREPROCEDURE PREPARATION:  Informed consent was procured from the patient.  The patient fasted for four hours prior to the procedure and prepped with  Osmoprep pills the night of and the morning of the procedure.  Risks and  benefits of the procedure including a 10% miss rate of cancer and polyps was  discussed with her as well.   PREPROCEDURE PHYSICAL:  VITAL SIGNS:  Stable vital signs.  NECK:  Supple.  CHEST:  Clear to auscultation.  CARDIOVASCULAR:  S1 and S2 regular.  ABDOMEN:  Soft with one healed surgical scar present.   DESCRIPTION OF PROCEDURE:  The patient was placed in left lateral decubitus  position, sedated with 80 mcg Fentanyl and 7 mg of Versed in slow  incremental doses.  Once the patient was adequately sedated and maintained  on low flow oxygen and continuous cardiac monitoring, the Olympus video  colonoscope was advanced from the rectum to the anastomosis without  difficulty.  The patient had a fairly good prep.  A small sessile polyp was  removed by cold biopsy from 5 cm (cold biopsies x2).  A few sigmoid  diverticula were seen with inspissated stool and small internal hemorrhoids  were appreciated on retroflexion.  The rectal exam was unremarkable.  The  patient  tolerated the procedure well without any immediate complications.   IMPRESSION:  1.  Small internal hemorrhoids.  2.  Sigmoid diverticulosis.  3.  Polyps biopsied from 5 cm (cold biopsies x2).   RECOMMENDATIONS:  1.  Await pathology results.  2.  Repeat colonoscopy in the next three years or earlier depending on      pathology results.  3.  Outpatient follow-up as needed arises in the future.  Brochures on      diverticulosis and a high fiber diet had been given to the patient for      education.  4.  Avoidance of all nonsteroidals is encouraged for now.      Anselmo Rod, M.D.  Electronically Signed     JNM/MEDQ  D:  08/23/2005  T:  08/24/2005  Job:  [id number]   cc:   Tanya D. Daphine Deutscher, M.D.  Fax: [phone]   Leighton Roach Truett Perna, M.D.  Fax: [phone]   Cherylynn Ridges, M.D.  [address], [address]  [place]  [place] [address]

## 2010-12-02 NOTE — Op Note (Signed)
NAME:  Kathy Scott, CIRRINCIONE NO.:  [id number]   MEDICAL RECORD NO.:  [id number]                   PATIENT TYPE:  AMB   LOCATION:  ENDO                                 FACILITY:  [facility]   PHYSICIAN:  Anselmo Rod, M.D.               DATE OF BIRTH:  09-03-1950   DATE OF PROCEDURE:  11/06/2002  DATE OF DISCHARGE:  11/07/2002                                 OPERATIVE REPORT   PROCEDURE PERFORMED:  Colonoscopy.   ENDOSCOPIST:  Anselmo Rod, M.D.   INSTRUMENT USED:  Olympus video colonoscope.   INDICATIONS FOR PROCEDURE:  This 60 year old African-American female with a  personal history of invasive adenocarcinoma of the cecum, status post right  hemicolectomy underwent a repeat colonoscopy in one year to rule out colonic  polyps, masses, etc.   PREPROCEDURE PREPARATION:  Informed consent was procured from the patient.  The patient was fasted for eight hours prior to the procedure and prepped  with a bottle of magnesium citrate and a gallon of GoLYTELY the night prior  to the procedure.   PREPROCEDURE PHYSICAL:  VITAL SIGNS:  The patient had stable vital signs.  NECK:  Supple.  CHEST:  Clear to auscultation.  S1 and S2 regular.  ABDOMEN:  Soft with normal bowel sounds.  Well-healed surgical scar present  in the abdomen from previous hemicolectomy.   DESCRIPTION OF PROCEDURE:  The patient was placed in the left lateral  decubitus position and sedated with 70 mg of Demerol and 7 mg of Versed  intravenously.  Once the patient was adequately sedated and maintained on  low-flow oxygen and continuous cardiac monitoring, the Olympus video  colonoscope was advanced in the rectum to 90 cm without difficulty.  A  healthy anastomosis was noted with a normal-appearing distal small bowel.  An isolated diverticulum was noted at 80 cm.  No masses, polyps, erosions,  or ulcerations were present.  Retroflexion in the rectum revealed a small  amount of bleeding  internal hemorrhoids.  The patient tolerated the  procedure well without complications.  There was a small amount of residual  stool in the colon.  Multiple washings were done prior to good visualization  of the colon.   IMPRESSION:  1. Healthy anastomosis at 90 cm with normal-appearing distal small bowel.  2. Small nonbleeding internal hemorrhoid.  3. No masses or polyps seen.   RECOMMENDATIONS:  1. Considering the patient's personal history of colon cancer, repeat     colorectal cancer screening is recommended in the next two years unless     the patient has any abnormal symptoms in the interim.  2. Outpatient followup on a p.r.n. basis.                                              Jyothi Nat Loreta Ave,  M.D.    JNM/MEDQ  D:  11/07/2002  T:  11/09/2002  Job:  [id number]   cc:   Merlene Laughter. Renae Gloss, M.D.  [address]  [place]  [place] [address]  Fax: [phone]   Leighton Roach. Truett Perna, M.D.  [address] [facility]  [place]  [place]  [address]  Fax: [phone]   Jimmye Norman III, M.D.  [address]  [place]  [place] [address]  Fax: [phone]

## 2010-12-02 NOTE — Op Note (Signed)
[place]. [facility]  Patient:    Kathy Scott, Kathy Scott Visit Number: [id number] MRN: [id number]          Service Type: MED Location: 3000 3030 01 Attending Physician:  Alva Garnet. Dictated by:   Jimmye Norman, M.D. Proc. Date: 10/30/01 Admit Date:  10/28/2001   CC:         Anselmo Rod, M.D.   Operative Report  PREOPERATIVE DIAGNOSIS:  Right cecal mass, likely carcinoma.  POSTOPERATIVE DIAGNOSIS:  Right cecal mass, likely carcinoma.  OPERATION PERFORMED:  Right hemicolectomy.  SURGEON:  Jimmye Norman, M.D.  ASSISTANT:  Angelia Mould. Derrell Lolling, M.D.  ANESTHESIA:  General endotracheal.  ESTIMATED BLOOD LOSS:  100 cc.  COMPLICATIONS:  None.  CONDITION:  Stable.  SPECIMENS:  Right colon with distal terminal ileum.  OPERATIVE FINDINGS:  The patient had an isolated right cecal ascending colon mass without evidence of local extension.  There were adhesions to the right lower quadrant presumably from the patients previous ectopic pregnancy surgery.  The liver was free of any palpable tumor.  There was no evidence of metastasis.  DISPOSITION:  To PACU, then back to her floor in stable condition.  INDICATIONS FOR PROCEDURE:  The patient was recently diagnosed with a right cecal mass from anemia and a colonoscopy demonstrating the mass.  She now comes in for surgery after bowel prep.  DESCRIPTION OF PROCEDURE:  The patient was taken to the operating room and placed on the table in supine position.  After adequate endotracheal anesthetic was administered, she was prepped in the usual sterile manner exposing the midline.  A midline incision was made from approximately 5 cm above the umbilicus to approximately 4 to 5 cm below.  It was taken to the right of the umbilicus. It was taken down to and through the midline fascia using electrocautery. Once we were in the peritoneal cavity, there were omental adhesions to the anterior fascia which were taken  down with electrocautery and also with Metzenbaum scissors. There were some small bowel adhesions to the anterior midline portion also which were taken down carefully without inadvertent enterotomy.  The right cecal mass could be easily palpated.  We used a Editor, commissioning on the right pericolic area in order to expose the line of Toldt.  This was taken down using electrocautery allowing [place] to lift up the right colon and ascending colon up to the hepatic flexure.  We also had to take down adhesions of the terminal ileum to the right lower quadrant area where there were dense adhesions.  The distal 10 to 15 cm of ileum was taken with the resection.  We used the GIA to come across the distal ileum and then subsequently took the mesentery between there and the proximal transverse colon which was the distal portion of our resection.  We dissected out along the hepatic flexure taking down omental attachments. There were also adhesions to the hepatic edge which were taken down without injury to the liver itself.  We did not identify definitive gallbladder.  We took down the ascending colon and hepatic flexure down to the duodenum and once we lifted the colon up we could take the mesentery between Kelly clamps and 2-0 silk ties down to the terminal ileum resection.  Once we had completely resected the area, we irrigated with saline, packed the right pericolic area and right upper quadrant with gauze and then performed an anastomosis between the distal transverse colon and the distal terminal ileum using  the GIA 75 stapler.  The blue staples allowed attachment of the colon to itself with resulting enterotomy being closed using a TA-55 stapler.  Once this was done, the mesentery was reapproximated using interrupted figure-of-eight stitches of 3-0 silk.  With this being done, we irrigated the right pericolic and right upper quadrant with warm saline solution, up to 2L was used.  There was  minimal bleeding.  No drains were left in place.  We allowed the reattached colon along with its reapproximated mesentery to fall into the right pericolic area and then we brought the omentum down over the midline.  We placed a malleable retractor in place and then we closed the fascia using a #1 PDS suture.  We irrigated the subcutaneous and then closed the skin using stainless steel staples.  Sponge, needle and instrument counts were correct. Dictated by:   Jimmye Norman, M.D. Attending Physician:  Andi Devon R. DD:  10/30/01 TD:  10/31/01 Job: [id number]

## 2010-12-02 NOTE — Procedures (Signed)
[place]. [facility]  Patient:    Kathy Scott, Kathy Scott Visit Number: [id number] MRN: [id number]          Service Type: MED Location: 3000 3030 01 Attending Physician:  Alva Garnet. Dictated by:   Anselmo Rod, M.D. Proc. Date: 10/28/01 Admit Date:  10/28/2001   CC:         Cala Bradford R. Renae Gloss, M.D.  Jimmye Norman, M.D.   Procedure Report  DATE OF BIRTH:  06/15/1951  REFERRING PHYSICIAN:  Merlene Laughter. Renae Gloss, M.D.  PROCEDURE PERFORMED:  Colonoscopy with multiple biopsies.  ENDOSCOPIST:  Anselmo Rod, M.D.  INSTRUMENT USED:  Olympus video adjustable pediatric colonoscope.  INDICATIONS FOR PROCEDURE:  Severe iron deficiency anemia with hemoglobin down to 6.4 gm/dl in a 60 year old African-American female with a family history of breast cancer and colon cancer.  Rule out colonic polyps, masses, hemorrhoids, etc.  PREPROCEDURE PREPARATION:  Informed consent was procured from the patient. The patient was fasted for eight hours prior to the procedure and prepped with a bottle of magnesium citrate and a gallon of NuLytely the night prior to the procedure.  PREPROCEDURE PHYSICAL:  The patient had stable vital signs.  Neck supple. Chest clear to auscultation.  S1, S2 regular.  Abdomen soft with normal bowel sounds.  DESCRIPTION OF PROCEDURE:  The patient was placed in the left lateral decubitus position and sedated with 100 mg of Demerol and 7.5 mg of Versed intravenously.  Once the patient was adequately sedated and maintained on low-flow oxygen and continuous cardiac monitoring, the Olympus video colonoscope was advanced from the rectum to the cecum without difficulty. The patient had scattered diverticular disease with inspissated stool in several of the diverticular pockets, a large "hard" mass was seen at the cecal base.  Multiple biopsies were done.  The rest of the colonic mucosa was unremarkable.  IMPRESSION: 1. Scattered  diverticular disease. 2. Large cecal mass, biopsies done.  RECOMMENDATIONS: 1. Check CEA level. 2. Continue serial CBCs. 3. Right hemicolectomy as discussed with Dr. Jimmye Norman.Dictated by:   Anselmo Rod, M.D. Attending Physician:  Andi Devon R. DD:  10/30/01 TD:  10/30/01 Job: [id number]

## 2010-12-02 NOTE — Op Note (Signed)
   NAME:  Kathy Scott, Kathy Scott NO.:  [id number]   MEDICAL RECORD NO.:  [id number]                   PATIENT TYPE:  AMB   LOCATION:  DSC                                  FACILITY:  [facility]   PHYSICIAN:  Jimmye Norman III, M.D.               DATE OF BIRTH:  1950/11/04   DATE OF PROCEDURE:  07/03/2002  DATE OF DISCHARGE:                                 OPERATIVE REPORT   PREOPERATIVE DIAGNOSIS:  Colon cancer with placement of left subclavian  MediPort catheter.   POSTOPERATIVE DIAGNOSIS:  Colon cancer with placement of left subclavian  MediPort catheter.   OPERATION PERFORMED:  Removal of left subclavian MediPort for Port-A-Cath.   SURGEON:  Jimmye Norman, M.D.   ASSISTANT:  None.   ANESTHESIA:  Subcutaneous and subcuticular 1% Xylocaine without epinephrine,  approximately 8 cc were used.   COMPLICATIONS:  None.   CONDITION:  Stable.   SPECIMENS:  MediPort with catheter intact.   INDICATIONS FOR PROCEDURE:  The patient is a 60 year old female who has  undergone a full course of treatment of chemotherapy for colon cancer, who  now requires removal.   DESCRIPTION OF PROCEDURE:  The patient was done in the minor room at [facility].  She was anesthetized over the site of her  subcutaneous reservoir incision using a 25 gauge needle and 1% Xylocaine.  Subsequently, a #15 blade was used to make an incision excising most of the  scar down into the subcutaneous tissue.  The catheter was accessed at the  neck of the reservoir and the tunnel where we brought out the catheter  placing pressure for five minutes at the entrance site of the catheter into  the vein.  We subsequently removed the reservoir from the subcutaneous  pocket removing part of the pocket also with tenotomy scissors and obtaining  hemostasis with direct pressure.  Once this was done we reapproximated the  skin using a running subcuticular stitch of  4-0 Vicryl and Steri-Strips  were applied to the wound.  All sponge and  needle counts were correct.  The patient was sent home with a sterile  dressing applied including Steri-Strips and antibiotic ointment.                                                Kathrin Ruddy, M.D.    JW/MEDQ  D:  07/03/2002  T:  07/03/2002  Job:  [id number]

## 2010-12-02 NOTE — H&P (Signed)
[place]. [facility]  Patient:    Kathy Scott, Kathy Scott. Visit Number: [id number] MRN: [id number]          Service Type: MED Location: 3000 3030 01 Attending Physician:  Alva Garnet. Dictated by:   Merlene Laughter Renae Gloss, M.D. Admit Date:  10/28/2001 Discharge Date: 11/04/2001                           History and Physical  CHIEF COMPLAINT:  Anemia.  HISTORY OF PRESENT ILLNESS:  Kathy Scott is a 60 year old lady who presents with a several-week history of progressive malaise and fatigue.  The office examination revealed a CBC of 6.9.  She is referred for hospitalization for an evaluation of anemia as well as a blood transfusion.  Since she is menopausal, I am very concerned about the possibility of a GI blood loss.  ALLERGIES:  No known drug allergies.  CURRENT MEDICATIONS 1. Dilantin 100 mg p.o. q.i.d. 2. Synthroid 0.05 mg p.o. q.d. 3. Norvasc 5 mg p.o. q.d. 4. HCTZ 25 mg p.o. q.d.  PAST MEDICAL HISTORY 1. Hypertension. 2. Hypothyroidism. 3. Osteopenia. 4. Seizure disorder.  FAMILY HISTORY:  Significant for hypertension in the mother.  Diabetes mellitus in the father.  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  She denies alcohol, tobacco, or drugs of abuse.  REVIEW OF SYSTEMS:  As per the health history assessment and HPI.  Greater than 10 systems were reviewed.  PHYSICAL EXAMINATION  GENERAL:  A well-developed, well-nourished black female, in no acute distress, but appearing tired.  VITAL SIGNS:  Blood pressure 150/68, pulse 80, weight 133 pounds, temperature 96.9 degrees.  HEENT:  Tympanic membranes within normal limits bilaterally.  No OP lesions. PERRLA.  NECK:  Supple with no masses, 2+ carotids.  No bruits.  LUNGS:  Clear to auscultation bilaterally.  HEART:  S1, S2, regular rate and rhythm.  No murmurs, rubs, or gallops.  ABDOMEN:  Soft, nontender,  nondistended.  Positive bowel sounds.  EXTREMITIES:  No clubbing, cyanosis, or edema.  NEUROLOGIC:  Alert and oriented x 3.  Cranial nerves intact.  ASSESSMENT/PLAN 1. Symptomatic anemia as stated above.  I am concerned about the    possibility of gastrointestinal blood loss.  PLAN:  She will be given a transfusion and a GI consultation will be obtained as well, as she will need a colonoscopy.  Iron studies will be obtained at this time as well.  2. Hypertension, usually well-controlled; however, she may need    medication adjustment during this hospitalization. 3. Hypothyroidism, well-controlled with her current dose of Synthroid. 4. Seizure disorder, well-controlled with her current dose of Dilantin. Dictated by:   Merlene Laughter Renae Gloss, M.D. Attending Physician:  Andi Devon R. DD:  11/05/01 TD:  11/05/01 Job: [id number]

## 2010-12-02 NOTE — Discharge Summary (Signed)
[place]. [facility]  Patient:    Kathy Scott, Kathy Scott. Visit Number: [id number] MRN: [id number]          Service Type: END Location: ENDO Attending Physician:  Charna Elizabeth Dictated by:   Merlene Laughter. Renae Gloss, M.D. Adm. Date:  10/28/01 Disc. Date: 11/04/01                             Discharge Summary  DISCHARGE DIAGNOSES: 1. Colon cancer. 2. Gastrointestinal blood loss anemia secondary to #1. 3. Hypertension. 4. Hypothyroidism. 5. Hypokalemia, resolved.  DISCHARGE MEDICATIONS: 1. Norvasc. 2. Hydrochlorothiazide. 3. Dilantin. 4. Synthroid. 5. Percocet.  HOSPITAL COURSE:  #1 - COLON CANCER:  The patient was admitted for evaluation and treatment of significant anemia with a hemoglobin of 6.9 mg/dl.  As an outpatient she had not suffered any acute bleeding; however, she complained of severe malaise and fatigue.  Colonoscopy per Dr. Loreta Ave revealed a large cecal mass with scattered diverticulosis.  Multiple biopsies were done which did confirm cancer.  She underwent a hemicolectomy per Dr. Lindie Spruce.  Two of 14 lymph nodes obtained from surgery contained metastatic carcinoma.  She recovered from surgery well.  She was discharged to home in stable condition with further follow-up per Dr. Lindie Spruce.  Further oncology follow-up will be scheduled as an outpatient within the next few days. Dictated by:   Merlene Laughter Renae Gloss, M.D. Attending Physician:  Charna Elizabeth DD:  12/19/01 TD:  12/21/01 Job: [id number]
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
## 2010-12-02 NOTE — Op Note (Signed)
[place]. [facility]  Patient:    Kathy Scott, Kathy Scott. Visit Number: [id number] MRN: [id number]          Service Type: DSU Location: [facility] Attending Physician:  Cherylynn Ridges Dictated by:   Jimmye Norman, M.D. Proc. Date: 12/05/01 Admit Date:  12/05/2001 Discharge Date: 12/05/2001   CC:         Jillyn Hidden B. Truett Perna, M.D.  Anselmo Rod, M.D.   Operative Report  PREOPERATIVE DIAGNOSIS:  Colon cancer.  POSTOPERATIVE DIAGNOSIS:  Colon cancer.  OPERATION:  Placement of left subclavian Port-A-Cath or Mediport catheter Bard specific type.  SURGEON: Jimmye Norman, M.D.  ASSISTANT:  None.  ANESTHESIA:  Monitored anesthesia care with 1% Xylocaine local.  ESTIMATED BLOOD LOSS:  Less than 20 cc.  FINDINGS:  Placement confirmed by fluoroscopy and chest x-ray, excellent flow into catheter and blood return.  INDICATIONS:  The patient is a 60 year old female recently diagnosed with colon cancer and comes in now for Mediport placement for post resection chemotherapy.  DESCRIPTION OF PROCEDURE:  The patient was taken to the operating room and placed on the table in supine position.  A towel roll was placed beneath the shoulders.  She was subsequently prepped in both subclavian areas with Betadine.  We accessed the left side initially after anesthetizing the subclavian area with Xylocaine; approximately 8 cc were used.  We accessed the vein with a single stick using a a 16-gauge needle through which a wire was passed and confirmed to be in position using fluoroscopy.  Once this was done, we made a pocket in the superior lateral aspect of the left breast, down into the subcutaneous tissue in which we attached and stabilized the Mediport.  We tunnelled the catheter part of the Mediport up towards the subclavian entrance site and brought it out from skin incision.  The was at the site where the wire entered into the vein.  Once we had done this, we secured the  reservoir in place using 3-0 Vicryl interrupted sutures x 2.  We then cut the catheter to the appropriate length, used an introducer over the wire to pass it to the subclavian system, and then passed the catheter through the split introducer into the vein.  Initial fluoroscopy confirmed that the catheter had gone up into the patients right internal jugular vein; however, we were able to reposition it down into the atrial superior vena cava juncture easily.  We subsequently irrigated the pocket using antibiotic solution.  The pocket was also soaked with antibiotic solution on a gauze.  We then closed the sites of the pockets using interrupted subcutaneous stitches of 3-0 Vicryl.  The skin was closed using running subcuticular stitch of 5-0 Vicryl.  The subclavian entrance site, which had to be enlarged slightly for repositioning of the catheter, was closed using two interrupted 3-0 Vicryl stitches in the subcutaneous tissue, and then the skin was closed there using a running subcuticular 5-0 Vicryl.  Steri-Strips were applied to these incision sites.  The Mediport was accessed with an [place]sco hover needle attached to a catheter and then aspirated with excellent blood return, then flushed with approximately 6 cc of dilute heparin solution, and then 3 cc of 1000 unit/cc heparin was injected into the reservoir and into the catheter.  Sterile dressing was applied.  All needle counts, sponge counts, and instrument counts were correct.  Chest x-ray is pending in PACU.Dictated by:   Jimmye Norman, M.D.  Attending Physician:  Cherylynn Ridges DD:  12/05/01 TD:  12/07/01 Job: [id number]

## 2011-02-01 ENCOUNTER — Emergency Department (HOSPITAL_COMMUNITY)
Admission: EM | Admit: 2011-02-01 | Discharge: 2011-02-01 | Disposition: A | Discharge status: Home / Self Care | Payer: BC Managed Care – PPO | Attending: Emergency Medicine | Admitting: Emergency Medicine

## 2011-02-01 DIAGNOSIS — I10 Essential (primary) hypertension: Secondary | ICD-10-CM | POA: Insufficient documentation

## 2011-02-01 DIAGNOSIS — R42 Dizziness and giddiness: Secondary | ICD-10-CM | POA: Insufficient documentation

## 2011-02-01 DIAGNOSIS — G40909 Epilepsy, unspecified, not intractable, without status epilepticus: Secondary | ICD-10-CM | POA: Insufficient documentation

## 2011-02-01 DIAGNOSIS — Z79899 Other long term (current) drug therapy: Secondary | ICD-10-CM | POA: Insufficient documentation

## 2011-02-01 LAB — POCT I-STAT, CHEM 8
Calcium, Ion: 1.12 mmol/L (ref 1.12–1.32)
Chloride: 104 mEq/L (ref 96–112)
HCT: 40 % (ref 36.0–46.0)
Sodium: 142 mEq/L (ref 135–145)
TCO2: 26 mmol/L (ref 0–100)

## 2011-02-01 LAB — PHENYTOIN LEVEL, TOTAL: Phenytoin Lvl: 18.1 ug/mL (ref 10.0–20.0)

## 2012-11-29 ENCOUNTER — Other Ambulatory Visit: Payer: Self-pay | Admitting: Family Medicine

## 2012-11-29 ENCOUNTER — Other Ambulatory Visit (HOSPITAL_COMMUNITY)
Admission: RE | Admit: 2012-11-29 | Discharge: 2012-11-29 | Disposition: A | Discharge status: Home / Self Care | Payer: BC Managed Care – PPO | Source: Ambulatory Visit | Attending: Family Medicine | Admitting: Family Medicine

## 2012-11-29 DIAGNOSIS — Z0000 Encounter for general adult medical examination without abnormal findings: Secondary | ICD-10-CM | POA: Insufficient documentation

## 2012-12-23 ENCOUNTER — Ambulatory Visit (INDEPENDENT_AMBULATORY_CARE_PROVIDER_SITE_OTHER): Payer: BC Managed Care – PPO | Admitting: Diagnostic Neuroimaging

## 2012-12-23 ENCOUNTER — Encounter: Payer: Self-pay | Admitting: Diagnostic Neuroimaging

## 2012-12-23 VITALS — BP 140/71 | HR 73 | Temp 98.0°F | Ht <= 58 in | Wt 135.0 lb

## 2012-12-23 DIAGNOSIS — G40919 Epilepsy, unspecified, intractable, without status epilepticus: Secondary | ICD-10-CM | POA: Insufficient documentation

## 2012-12-23 DIAGNOSIS — G40909 Epilepsy, unspecified, not intractable, without status epilepticus: Secondary | ICD-10-CM

## 2012-12-23 DIAGNOSIS — M810 Age-related osteoporosis without current pathological fracture: Secondary | ICD-10-CM | POA: Insufficient documentation

## 2012-12-23 MED ORDER — LEVETIRACETAM ER 500 MG PO TB24: 1000.0000 mg | ORAL_TABLET | Freq: Every day | ORAL | Status: DC
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
## 2012-12-23 NOTE — Patient Instructions (Addendum)
We will transition to a new seizure medication, Keppra. Take both Keppra and Phenytoin for 1st week.   Each week, take 1 less capsule of Phenytoin until off. Come back for follow up in 4 months.Epilepsy A seizure (convulsion) is a sudden change in brain function that causes a change in behavior, muscle activity, or ability to remain awake and alert. If a person has recurring seizures, this is called epilepsy. CAUSES  Epilepsy is a disorder with many possible causes. Anything that disturbs the normal pattern of brain cell activity can lead to seizures. Seizure can be caused from illness to brain damage to abnormal brain development. Epilepsy may develop because of:  An abnormality in brain wiring.  An imbalance of nerve signaling chemicals (neurotransmitters).  Some combination of these factors. Scientists are learning an increasing amount about genetic causes of seizures. SYMPTOMS  The symptoms of a seizure can vary greatly from one person to another. These may include:  An aura, or warning that tells a person they are about to have a seizure.  Abnormal sensations, such as abnormal smell or seeing flashing lights.  Sudden, general body stiffness.  Rhythmic jerking of the face, arm, or leg  on one or both sides.  Sudden change in consciousness.  The person may appear to be awake but not responding.  They may appear to be asleep but cannot be awakened.  Grimacing, chewing, lip smacking, or drooling.  Often there is a period of sleepiness after a seizure. DIAGNOSIS  The description you give to your caregiver about what you experienced will help them understand your problems. Equally important is the description by any witnesses to your seizure. A physical exam, including a detailed neurological exam, is necessary. An EEG (electroencephalogram) is a painless test of your brain waves. In this test a diagram is created of your brain waves. These diagrams can be interpreted by a  specialist. Pictures of your brain are usually taken with:  An MRI.  A CT scan. Lab tests may be done to look for:  Signs of infection.  Abnormal blood chemistry. PREVENTION  There is no way to prevent the development of epilepsy. If you have seizures that are typically triggered by an event (such as flashing lights), try to avoid the trigger. This can help you avoid a seizure.  PROGNOSIS  Most people with epilepsy lead outwardly normal lives. While epilepsy cannot currently be cured, for some people it does eventually go away. Most seizures do not cause brain damage. It is not uncommon for people with epilepsy, especially children, to develop behavioral and emotional problems. These problems are sometimes the consequence of medicine for seizures or social stress. For some people with epilepsy, the risk of seizures restricts their independence and recreational activities. For example, some states refuse drivers licenses to people with epilepsy. Most women with epilepsy can become pregnant. They should discuss their epilepsy and the medicine they are taking with their caregivers. Women with epilepsy have a 90 percent or better chance of having a normal, healthy baby. RISKS AND COMPLICATIONS  People with epilepsy are at increased risk of falls, accidents, and injuries. People with epilepsy are at special risk for two life-threatening conditions. These are status epilepticus and sudden unexplained death (extremely rare). Status epilepticus is a long lasting, continuous seizure that is a medical emergency. TREATMENT  Once epilepsy is diagnosed, it is important to begin treatment as soon as possible. For about 80 percent of those diagnosed with epilepsy, seizures can be controlled with  modern medicines and surgical techniques. Some antiepileptic drugs can interfere with the effectiveness of oral contraceptives. In 1997, the FDA approved a pacemaker for the brain the (vagus nerve stimulator). This  stimulator can be used for people with seizures that are not well-controlled by medicine. Studies have shown that in some cases, children may experience fewer seizures if they maintain a strict diet. The strict diet is called the ketogenic diet. This diet is rich in fats and low in carbohydrates. HOME CARE INSTRUCTIONS   Your caregiver will make recommendations about driving and safety in normal activities. Follow these carefully.  Take any medicine prescribed exactly as directed.  Do any blood tests requested to monitor the levels of your medicine.  The people you live and work with should know that you are prone to seizures. They should receive instructions on how to help you. In general, a witness to a seizure should:  Cushion your head and body.  Turn you on your side.  Avoid unnecessarily restraining you.  Not place anything inside your mouth.  Call for local emergency medical help if there is any question about what has occurred.  Keep a seizure diary. Record what you recall about any seizure, especially any possible trigger.  If your caregiver has given you a follow-up appointment, it is very important to keep that appointment. Not keeping the appointment could result in permanent injury and disability. If there is any problem keeping the appointment, you must call back to this facility for assistance. SEEK MEDICAL CARE IF:   You develop signs of infection or other illness. This might increase the risk of a seizure.  You seem to be having more frequent seizures.  Your seizure pattern is changing. SEEK IMMEDIATE MEDICAL CARE IF:   A seizure does not stop after a few moments.  A seizure causes any difficulty in breathing.  A seizure results in a very severe headache.  A seizure leaves you with the inability to speak or use a part of your body. MAKE SURE YOU:   Understand these instructions.  Will watch your condition.  Will get help right away if you are not doing  well or get worse. Document Released: 07/03/2005 Document Revised: 09/25/2011 Document Reviewed: 02/07/2008 [facility] Patient Information 2014 [place], [place].

## 2013-01-09 ENCOUNTER — Telehealth: Payer: Self-pay | Admitting: Diagnostic Neuroimaging

## 2013-01-09 NOTE — Telephone Encounter (Signed)
Pt states she attempted to taper off of med (Dilantin) per last OV plan, but she felt like she was going to have a sz. She started back taking the Dilantin, and would like to notify MD.

## 2013-01-10 NOTE — Telephone Encounter (Signed)
I called patient. She started dilantin taper 3 days ago (sooner than I had recommended). No sz, but felt funny, so now back to LEV 1000 daily and DPH 300mg  daily. Rec that she continue this regimen for another 1 month, then try slow taper again. -VRP

## 2013-05-22 ENCOUNTER — Other Ambulatory Visit: Payer: Self-pay

## 2013-09-01 ENCOUNTER — Encounter: Payer: Self-pay | Admitting: Diagnostic Neuroimaging

## 2013-09-01 ENCOUNTER — Ambulatory Visit (INDEPENDENT_AMBULATORY_CARE_PROVIDER_SITE_OTHER): Payer: BC Managed Care – PPO | Admitting: Diagnostic Neuroimaging

## 2013-09-01 VITALS — BP 145/80 | HR 70 | Ht 59.75 in | Wt 149.0 lb

## 2013-09-01 DIAGNOSIS — G40909 Epilepsy, unspecified, not intractable, without status epilepticus: Secondary | ICD-10-CM

## 2013-09-01 MED ORDER — LEVETIRACETAM ER 500 MG PO TB24: 1000.0000 mg | ORAL_TABLET | Freq: Every day | ORAL | Status: DC
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
## 2014-08-20 ENCOUNTER — Telehealth: Payer: Self-pay | Admitting: Diagnostic Neuroimaging

## 2014-08-20 DIAGNOSIS — G40909 Epilepsy, unspecified, not intractable, without status epilepticus: Secondary | ICD-10-CM

## 2014-08-20 MED ORDER — LEVETIRACETAM ER 500 MG PO TB24: 1000.0000 mg | ORAL_TABLET | Freq: Every day | ORAL | Status: DC

## 2014-08-20 NOTE — Telephone Encounter (Signed)
Rx has been sent.  I called back.  Got no answer, left message.

## 2014-08-20 NOTE — Telephone Encounter (Signed)
Patient has an apt next week but needs a refill on her meds before. The RX is LevETIRAcetam ER 500 MG  She uses [facility] on [address] number to reach patient is [phone]

## 2014-09-01 ENCOUNTER — Ambulatory Visit (INDEPENDENT_AMBULATORY_CARE_PROVIDER_SITE_OTHER): Payer: Medicare Other | Admitting: Diagnostic Neuroimaging

## 2014-09-01 ENCOUNTER — Encounter: Payer: Self-pay | Admitting: Diagnostic Neuroimaging

## 2014-09-01 VITALS — BP 156/76 | HR 94 | Ht <= 58 in | Wt 148.0 lb

## 2014-09-01 DIAGNOSIS — G40909 Epilepsy, unspecified, not intractable, without status epilepticus: Secondary | ICD-10-CM

## 2014-09-01 DIAGNOSIS — G40209 Localization-related (focal) (partial) symptomatic epilepsy and epileptic syndromes with complex partial seizures, not intractable, without status epilepticus: Secondary | ICD-10-CM

## 2014-09-01 MED ORDER — LEVETIRACETAM ER 500 MG PO TB24: 1000.0000 mg | ORAL_TABLET | Freq: Every day | ORAL | Status: DC
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
## 2014-09-01 NOTE — Patient Instructions (Signed)
Continue levetiracetam.  Keep up the good work!

## 2015-02-23 ENCOUNTER — Other Ambulatory Visit (HOSPITAL_COMMUNITY)
Admission: RE | Admit: 2015-02-23 | Discharge: 2015-02-23 | Disposition: A | Discharge status: Home / Self Care | Payer: Medicare Other | Source: Ambulatory Visit | Attending: Family Medicine | Admitting: Family Medicine

## 2015-02-23 ENCOUNTER — Other Ambulatory Visit: Payer: Self-pay | Admitting: Family Medicine

## 2015-02-23 DIAGNOSIS — Z01411 Encounter for gynecological examination (general) (routine) with abnormal findings: Secondary | ICD-10-CM | POA: Diagnosis present

## 2015-02-24 LAB — CYTOLOGY - PAP

## 2015-08-22 ENCOUNTER — Other Ambulatory Visit: Payer: Self-pay | Admitting: Diagnostic Neuroimaging

## 2015-09-07 ENCOUNTER — Ambulatory Visit (INDEPENDENT_AMBULATORY_CARE_PROVIDER_SITE_OTHER): Payer: Medicare Other | Admitting: Diagnostic Neuroimaging

## 2015-09-07 ENCOUNTER — Encounter: Payer: Self-pay | Admitting: Diagnostic Neuroimaging

## 2015-09-07 VITALS — BP 141/66 | HR 58 | Ht 59.75 in | Wt 147.6 lb

## 2015-09-07 DIAGNOSIS — G40209 Localization-related (focal) (partial) symptomatic epilepsy and epileptic syndromes with complex partial seizures, not intractable, without status epilepticus: Secondary | ICD-10-CM | POA: Diagnosis not present

## 2015-09-07 MED ORDER — LEVETIRACETAM ER 500 MG PO TB24: 1000.0000 mg | ORAL_TABLET | Freq: Every day | ORAL | Status: DC
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
## 2015-09-07 NOTE — Patient Instructions (Signed)

## 2016-09-05 ENCOUNTER — Encounter: Payer: Self-pay | Admitting: Diagnostic Neuroimaging

## 2016-09-05 ENCOUNTER — Ambulatory Visit (INDEPENDENT_AMBULATORY_CARE_PROVIDER_SITE_OTHER): Payer: Medicare Other | Admitting: Diagnostic Neuroimaging

## 2016-09-05 VITALS — BP 118/64 | HR 68 | Wt 150.6 lb

## 2016-09-05 DIAGNOSIS — G40209 Localization-related (focal) (partial) symptomatic epilepsy and epileptic syndromes with complex partial seizures, not intractable, without status epilepticus: Secondary | ICD-10-CM

## 2016-09-05 MED ORDER — LEVETIRACETAM ER 500 MG PO TB24: 1000.0000 mg | ORAL_TABLET | Freq: Every day | ORAL | 4 refills | Status: DC
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
## 2016-10-05 ENCOUNTER — Telehealth: Payer: Self-pay | Admitting: Diagnostic Neuroimaging

## 2016-10-05 MED ORDER — LEVETIRACETAM ER 500 MG PO TB24: 1000.0000 mg | ORAL_TABLET | Freq: Every day | ORAL | 4 refills | Status: DC

## 2016-10-05 NOTE — Telephone Encounter (Signed)
I changed to generic. -VRP

## 2016-10-05 NOTE — Telephone Encounter (Signed)
Pt says RX sent for levETIRAcetam (KEPPRA XR) 500 MG 24 hr tablet specifies brand only. Pt said she has been getting generic for the past 3 years and [employer] will not pay for brand. Please resend new RX for generic.

## 2016-12-13 ENCOUNTER — Other Ambulatory Visit: Payer: Self-pay | Admitting: Family Medicine

## 2016-12-13 DIAGNOSIS — Z1231 Encounter for screening mammogram for malignant neoplasm of breast: Secondary | ICD-10-CM

## 2016-12-28 ENCOUNTER — Ambulatory Visit: Payer: BC Managed Care – PPO

## 2016-12-29 ENCOUNTER — Ambulatory Visit: Payer: BC Managed Care – PPO

## 2017-01-08 ENCOUNTER — Ambulatory Visit
Admission: RE | Admit: 2017-01-08 | Discharge: 2017-01-08 | Disposition: A | Discharge status: Home / Self Care | Payer: Medicare Other | Source: Ambulatory Visit | Attending: Family Medicine | Admitting: Family Medicine

## 2017-01-08 DIAGNOSIS — Z1231 Encounter for screening mammogram for malignant neoplasm of breast: Secondary | ICD-10-CM

## 2017-09-05 ENCOUNTER — Ambulatory Visit: Payer: Medicare Other | Admitting: Diagnostic Neuroimaging

## 2017-10-01 ENCOUNTER — Other Ambulatory Visit: Payer: Self-pay | Admitting: Diagnostic Neuroimaging

## 2017-12-30 ENCOUNTER — Other Ambulatory Visit: Payer: Self-pay | Admitting: Diagnostic Neuroimaging

## 2018-01-01 ENCOUNTER — Encounter: Payer: Self-pay | Admitting: Diagnostic Neuroimaging

## 2018-01-01 ENCOUNTER — Ambulatory Visit: Payer: Medicare Other | Admitting: Diagnostic Neuroimaging

## 2018-01-01 VITALS — BP 104/68 | HR 68 | Ht 59.75 in | Wt 140.0 lb

## 2018-01-01 DIAGNOSIS — G40209 Localization-related (focal) (partial) symptomatic epilepsy and epileptic syndromes with complex partial seizures, not intractable, without status epilepticus: Secondary | ICD-10-CM | POA: Diagnosis not present

## 2018-01-01 MED ORDER — LEVETIRACETAM ER 500 MG PO TB24: 1000.0000 mg | ORAL_TABLET | Freq: Every day | ORAL | 4 refills | Status: DC
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
## 2018-05-01 DIAGNOSIS — M19072 Primary osteoarthritis, left ankle and foot: Secondary | ICD-10-CM | POA: Insufficient documentation

## 2018-06-16 HISTORY — PX: ANKLE SURGERY: SHX546

## 2018-06-19 DIAGNOSIS — I10 Essential (primary) hypertension: Secondary | ICD-10-CM | POA: Insufficient documentation

## 2018-06-19 DIAGNOSIS — E039 Hypothyroidism, unspecified: Secondary | ICD-10-CM | POA: Diagnosis present

## 2018-11-28 ENCOUNTER — Telehealth: Payer: Self-pay | Admitting: *Deleted

## 2018-11-28 ENCOUNTER — Encounter: Payer: Self-pay | Admitting: *Deleted

## 2018-11-28 NOTE — Telephone Encounter (Signed)
5-14 Pt has called, she gave verbal consent to file insurance for [email or web address] vv for 05-19 pt email confirmed as:[email or web address]  Pt understands that although there may be some limitations with this type of visit, we will take all precautions to reduce any security or privacy concerns.  Pt understands that this will be treated like an in office visit and we will file with pt's insurance, and there may be a patient responsible charge related to this service. *e-mail sent to confirmed e-mail address*

## 2018-11-28 NOTE — Telephone Encounter (Signed)
Called patient and updated EMR. 

## 2018-11-28 NOTE — Telephone Encounter (Signed)
LVM advising that due to current COVID 19 pandemic, our office is severely reducing in person visits in order to minimize the risk to our patients and healthcare providers. We recommend to convert your appointment to a video visit. Advised we can move appt to next week. Left number and requested call back to discuss.

## 2018-11-28 NOTE — Addendum Note (Signed)
Addended by: Minna Antis on: 11/28/2018 01:47 PM   Modules accepted: Orders

## 2018-12-03 ENCOUNTER — Encounter: Payer: Self-pay | Admitting: Diagnostic Neuroimaging

## 2018-12-03 ENCOUNTER — Ambulatory Visit: Payer: Medicare Other | Admitting: Diagnostic Neuroimaging

## 2018-12-03 ENCOUNTER — Ambulatory Visit (INDEPENDENT_AMBULATORY_CARE_PROVIDER_SITE_OTHER): Payer: Medicare Other | Admitting: Diagnostic Neuroimaging

## 2018-12-03 ENCOUNTER — Other Ambulatory Visit: Payer: Self-pay

## 2018-12-03 DIAGNOSIS — G40209 Localization-related (focal) (partial) symptomatic epilepsy and epileptic syndromes with complex partial seizures, not intractable, without status epilepticus: Secondary | ICD-10-CM

## 2018-12-03 MED ORDER — LEVETIRACETAM ER 500 MG PO TB24
1000.0000 mg | ORAL_TABLET | Freq: Every day | ORAL | 4 refills | Status: DC
Start: 2018-12-03 — End: 2019-12-03

## 2018-12-03 NOTE — Progress Notes (Signed)
Virtual Visit via Video Note  I connected with Kathy Scott on 12/03/18 at  8:45 AM EDT by a video enabled telemedicine application and verified that I am speaking with the correct person using two identifiers.   I discussed the limitations of evaluation and management by telemedicine and the availability of in person appointments. The patient expressed understanding and agreed to proceed.  Patient is at their home. I am at the office.    History of Present Illness:  UPDATE (12/03/18, VRP): Since last visit, doing well. No seizures. Symptoms are stable. Tolerating LEV XR 1000mg  daily.    UPDATE (01/01/18, VRP): Since last visit, doing well. Tolerating meds. No alleviating or aggravating factors. No seizures. Mother has passed away (47 yrs old). 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 Lost some weight with "Next 56 days" program from [employer]   UPDATE 09/05/16: Since last visit, doing well. No seizures. Medication (Keppra XR 1000mg  in AM) doing well and covered by her insurance and retirement plan. Pt mother was in hospital last year (x 2), but now better. Has a new Apple Watch and planning to start exercise program again this year.    Observations/Objective:  VIDEO EXAM  GENERAL EXAM/CONSTITUTIONAL:  Vitals: There were no vitals filed for this visit.  There is no height or weight on file to calculate BMI. Wt Readings from Last 3 Encounters:  01/01/18 140 lb (63.5 kg)  09/05/16 150 lb 9.6 oz (68.3 kg)  09/07/15 147 lb 9.6 oz (67 kg)     Patient is in no distress; well developed, nourished and groomed; neck is supple   NEUROLOGIC: MENTAL STATUS:  No flowsheet data found.  awake, alert, oriented to person, place and time  recent and remote memory intact  normal attention and concentration  language fluent, comprehension intact, naming intact  fund of knowledge appropriate  CRANIAL NERVE:   2nd, 3rd, 4th, 6th - visual fields full to  confrontation, extraocular muscles intact, no nystagmus  5th - facial sensation symmetric  7th - facial strength symmetric  8th - hearing intact  11th - shoulder shrug symmetric  12th - tongue protrusion midline  MOTOR:   NO TREMOR; NO DRIFT IN BUE  SENSORY:   normal and symmetric to light touch  COORDINATION:   fine finger movements normal    Assessment and Plan:  68 y.o. female  has a past medical history of Cancer ([place]), Foot fracture, Hypertension, Hypothyroidism, and Seizures ([place]). here with seizure disorder, maintained for ~20 years on dilantin, now with osteoporosis. Successfully transitioned to levetiracetam XR 1000 mg daily, and off dilantin (since Sept 2014). Last seizure ~ 2011 (when off meds x 3 days).   Dx:  1. Partial symptomatic epilepsy with complex partial seizures, not intractable, without status epilepticus ([place])     SEIZURE DISORDER - continue LEV XR 1000mg  daily (generic ok) - reviewed health and wellness strategies (fitness)  Meds ordered this encounter  Medications   levETIRAcetam (KEPPRA XR) 500 MG 24 hr tablet    Sig: Take 2 tablets (1,000 mg total) by mouth daily.    Dispense:  180 tablet    Refill:  4    Follow Up Instructions:  - Return in about 1 year (around 12/03/2019).    I discussed the assessment and treatment plan with the patient. The patient was provided an opportunity to ask questions and all were answered. The patient agreed with the plan and demonstrated an understanding of the instructions.  The patient was advised to call back or seek an in-person evaluation if the symptoms worsen or if the condition fails to improve as anticipated.  I provided 15 minutes of non-face-to-face time during this encounter.   Penni Bombard, MD 03/16/7459, 0:29 AM Certified in Neurology, Neurophysiology and Neuroimaging  [facility] [address] [place], [place] [address] [phone]

## 2019-01-07 ENCOUNTER — Ambulatory Visit: Payer: Medicare Other | Admitting: Diagnostic Neuroimaging

## 2019-08-01 ENCOUNTER — Ambulatory Visit: Payer: Medicare Other | Attending: Internal Medicine

## 2019-08-01 DIAGNOSIS — Z20822 Contact with and (suspected) exposure to covid-19: Secondary | ICD-10-CM

## 2019-08-02 LAB — NOVEL CORONAVIRUS, NAA: SARS-CoV-2, NAA: NOT DETECTED

## 2019-08-08 ENCOUNTER — Ambulatory Visit: Payer: Medicare Other | Attending: Internal Medicine

## 2019-08-08 DIAGNOSIS — Z23 Encounter for immunization: Secondary | ICD-10-CM | POA: Insufficient documentation

## 2019-08-08 NOTE — Progress Notes (Signed)
   Covid-19 Vaccination Clinic  Name:  Kathy Scott    MRN: [id number] DOB: 1950/11/05  08/08/2019  [name] was observed post Covid-19 immunization for 15 minutes without incidence. She was provided with Vaccine Information Sheet and instruction to access the V-Safe system.   [name] was instructed to call 911 with any severe reactions post vaccine: Marland Kitchen Difficulty breathing  . Swelling of your face and throat  . A fast heartbeat  . A bad rash all over your body  . Dizziness and weakness    Immunizations Administered    Name Date Dose VIS Date Route   Pfizer COVID-19 Vaccine 08/08/2019 12:34 PM 0.3 mL 06/27/2019 Intramuscular   Manufacturer: [place]   Lot: [id number]   [place]: [id number]

## 2019-08-29 ENCOUNTER — Ambulatory Visit: Payer: Medicare Other | Attending: Internal Medicine

## 2019-08-29 DIAGNOSIS — Z23 Encounter for immunization: Secondary | ICD-10-CM

## 2019-08-29 NOTE — Progress Notes (Signed)
   Covid-19 Vaccination Clinic  Name:  Kathy Scott    MRN: [id number] DOB: 04-Aug-1950  08/29/2019  Ms. Kathy Scott was observed post Covid-19 immunization for 15 minutes without incidence. She was provided with Vaccine Information Sheet and instruction to access the V-Safe system.   Ms. Kathy Scott was instructed to call 911 with any severe reactions post vaccine: Marland Kitchen Difficulty breathing  . Swelling of your face and throat  . A fast heartbeat  . A bad rash all over your body  . Dizziness and weakness    Immunizations Administered    Name Date Dose VIS Date Route   Pfizer COVID-19 Vaccine 08/29/2019  8:21 AM 0.3 mL 06/27/2019 Intramuscular   Manufacturer: [place]   Lot: [id number]   [place]: [id number]

## 2019-12-03 ENCOUNTER — Other Ambulatory Visit: Payer: Self-pay

## 2019-12-03 ENCOUNTER — Ambulatory Visit: Payer: Medicare Other | Admitting: Diagnostic Neuroimaging

## 2019-12-03 ENCOUNTER — Encounter: Payer: Self-pay | Admitting: Diagnostic Neuroimaging

## 2019-12-03 VITALS — BP 141/73 | HR 80 | Ht <= 58 in | Wt 147.4 lb

## 2019-12-03 DIAGNOSIS — G40209 Localization-related (focal) (partial) symptomatic epilepsy and epileptic syndromes with complex partial seizures, not intractable, without status epilepticus: Secondary | ICD-10-CM | POA: Diagnosis not present

## 2019-12-03 MED ORDER — LEVETIRACETAM ER 500 MG PO TB24: 1000.0000 mg | ORAL_TABLET | Freq: Every day | ORAL | 4 refills | Status: DC

## 2019-12-03 NOTE — Progress Notes (Signed)
Chief Complaint  Patient presents with  . Epilepsy    rm 7, one year FU "no siezures"    History of Present Illness:  UPDATE (12/03/19, VRP): Since last visit, doing well. Symptoms are stable. No alleviating or aggravating factors. Tolerating levetiracetam.    UPDATE (12/03/18, VRP): Since last visit, doing well. No seizures. Symptoms are stable. Tolerating LEV XR 1000mg  daily.    UPDATE (01/01/18, VRP): Since last visit, doing well. Tolerating meds. No alleviating or aggravating factors. No seizures. Mother has passed away (53 yrs old). 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 Lost some weight with "Next 56 days" program from [employer]   UPDATE 09/05/16: Since last visit, doing well. No seizures. Medication (Keppra XR 1000mg  in AM) doing well and covered by her insurance and retirement plan. Pt mother was in hospital last year (x 2), but now better. Has a new Apple Watch and planning to start exercise program again this year.    Observations/Objective:  GENERAL EXAM/CONSTITUTIONAL: Vitals:  Vitals:   12/03/19 1310  BP: (!) 141/73  Pulse: 80  Weight: 147 lb 6.4 oz (66.9 kg)  Height: 4' 9.75" (1.467 m)     Body mass index is 31.07 kg/m. Wt Readings from Last 3 Encounters:  12/03/19 147 lb 6.4 oz (66.9 kg)  01/01/18 140 lb (63.5 kg)  09/05/16 150 lb 9.6 oz (68.3 kg)     Patient is in no distress; well developed, nourished and groomed; neck is supple  CARDIOVASCULAR:  Examination of carotid arteries is normal; no carotid bruits  Regular rate and rhythm, no murmurs  Examination of peripheral vascular system by observation and palpation is normal  EYES:  Ophthalmoscopic exam of optic discs and posterior segments is normal; no papilledema or hemorrhages  No exam data present  MUSCULOSKELETAL:  Gait, strength, tone, movements noted in Neurologic exam below  NEUROLOGIC: MENTAL STATUS:  No flowsheet data found.  awake, alert, oriented to person, place and  time  recent and remote memory intact  normal attention and concentration  language fluent, comprehension intact, naming intact  fund of knowledge appropriate  CRANIAL NERVE:   2nd - no papilledema on fundoscopic exam  2nd, 3rd, 4th, 6th - pupils equal and reactive to light, visual fields full to confrontation, extraocular muscles intact, no nystagmus  5th - facial sensation symmetric  7th - facial strength symmetric  8th - hearing intact  9th - palate elevates symmetrically, uvula midline  11th - shoulder shrug symmetric  12th - tongue protrusion midline  MOTOR:   normal bulk and tone, full strength in the BUE, BLE  SENSORY:   normal and symmetric to light touch  COORDINATION:   finger-nose-finger, fine finger movements normal  REFLEXES:   deep tendon reflexes present and symmetric  GAIT/STATION:   narrow based gait     Assessment and Plan:  69 y.o. female  has a past medical history of Cancer ([place]), Foot fracture, Hypertension, Hypothyroidism, and Seizures ([place]). here with seizure disorder, maintained for ~20 years on dilantin, now with osteoporosis. Successfully transitioned to levetiracetam XR 1000 mg daily, and off dilantin (since Sept 2014). Last seizure ~ 2011 (when off meds x 3 days).   Dx:  1. Partial symptomatic epilepsy with complex partial seizures, not intractable, without status epilepticus ([place])      SEIZURE DISORDER - continue LEV XR 1000mg  daily (generic ok) - reviewed health and wellness strategies (fitness)  Meds ordered this encounter  Medications  . levETIRAcetam (KEPPRA XR)  500 MG 24 hr tablet    Sig: Take 2 tablets (1,000 mg total) by mouth daily.    Dispense:  180 tablet    Refill:  4    Follow Up Instructions:  - Return in about 1 year (around 12/02/2020).     Penni Bombard, MD [id number], [id number] PM Certified in Neurology, Neurophysiology and Neuroimaging  [facility] [address],  [place], [place] [address] [phone]

## 2019-12-11 ENCOUNTER — Other Ambulatory Visit: Payer: Self-pay | Admitting: Endocrinology

## 2019-12-11 DIAGNOSIS — M810 Age-related osteoporosis without current pathological fracture: Secondary | ICD-10-CM

## 2019-12-11 DIAGNOSIS — Z1231 Encounter for screening mammogram for malignant neoplasm of breast: Secondary | ICD-10-CM

## 2020-03-05 ENCOUNTER — Ambulatory Visit
Admission: RE | Admit: 2020-03-05 | Discharge: 2020-03-05 | Disposition: A | Discharge status: Home / Self Care | Payer: Medicare Other | Source: Ambulatory Visit | Attending: Endocrinology | Admitting: Endocrinology

## 2020-03-05 ENCOUNTER — Other Ambulatory Visit: Payer: Self-pay

## 2020-03-05 DIAGNOSIS — Z1231 Encounter for screening mammogram for malignant neoplasm of breast: Secondary | ICD-10-CM

## 2020-03-05 DIAGNOSIS — M810 Age-related osteoporosis without current pathological fracture: Secondary | ICD-10-CM

## 2020-03-05 IMAGING — MG DIGITAL SCREENING BILAT W/ TOMO W/ CAD
8 series · 8 of 24 positions shown · non-contrast
Comparison: Previous exam(s).

CLINICAL DATA: Screening.

EXAM:
DIGITAL SCREENING BILATERAL MAMMOGRAM WITH TOMO AND CAD

[R MLO synth-2D]
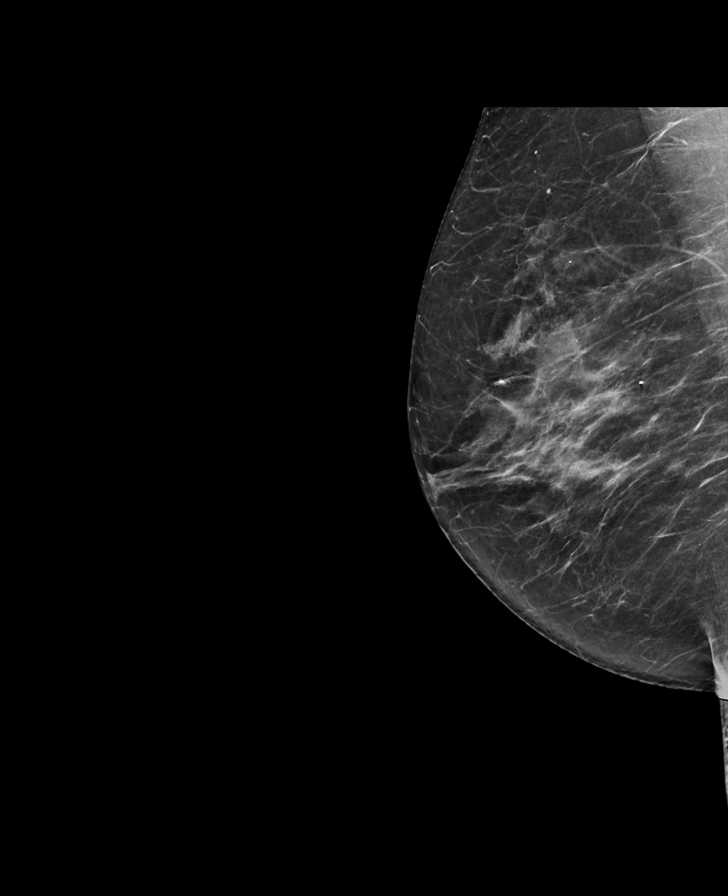

[L MLO synth-2D]
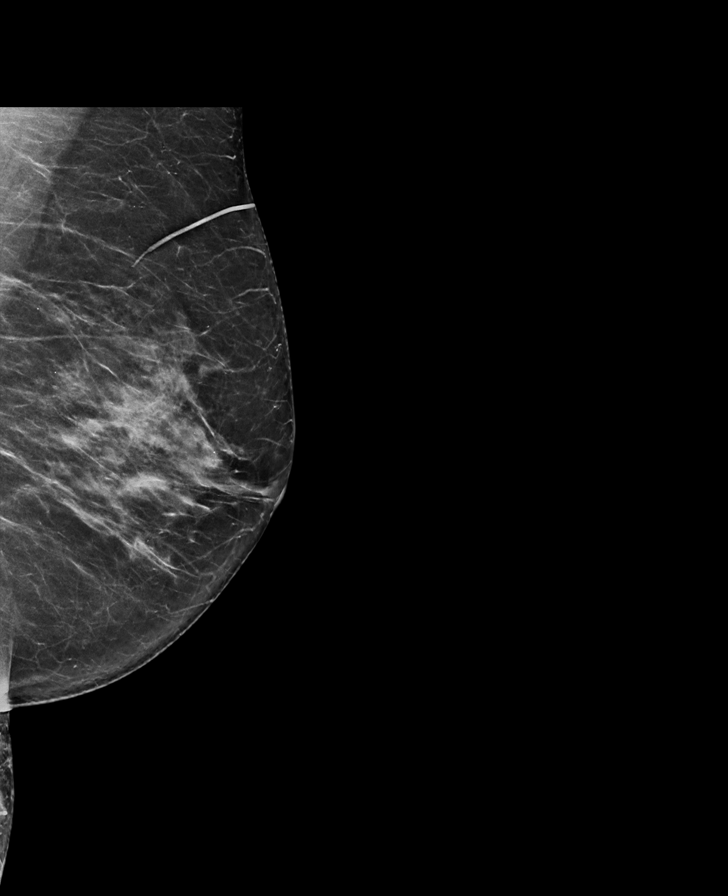

[R CC synth-2D]
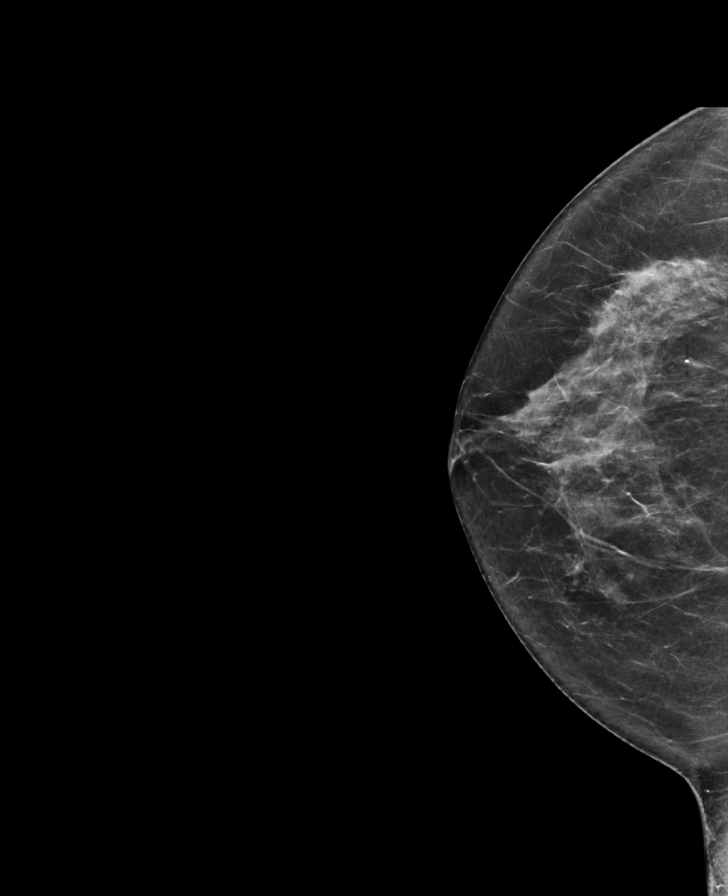

[L CC synth-2D]
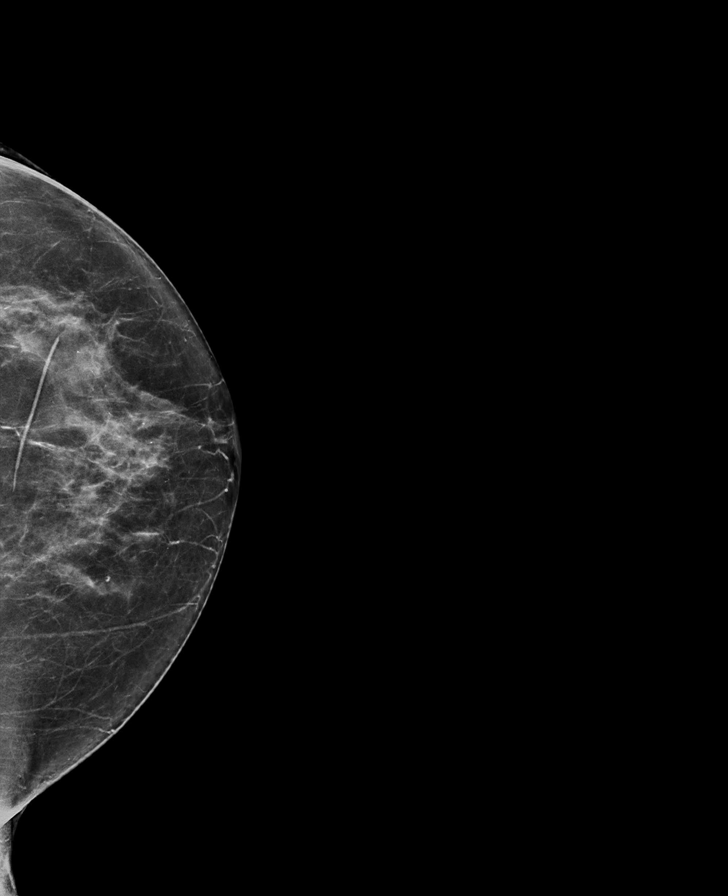

[L MLO tomo · tomo slice 35/70.0]
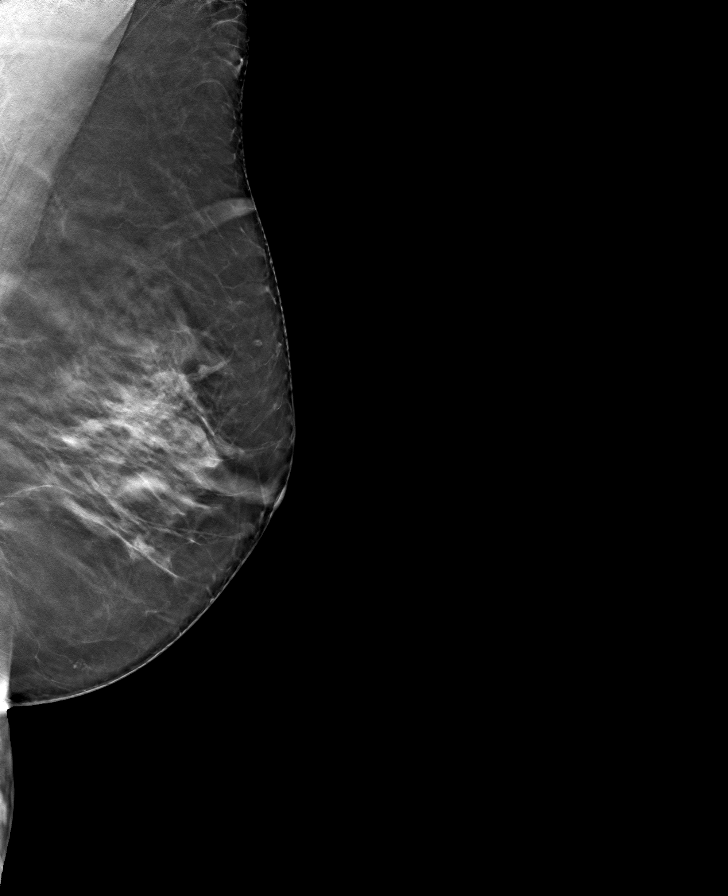

[L CC tomo · tomo slice 36/71.0]
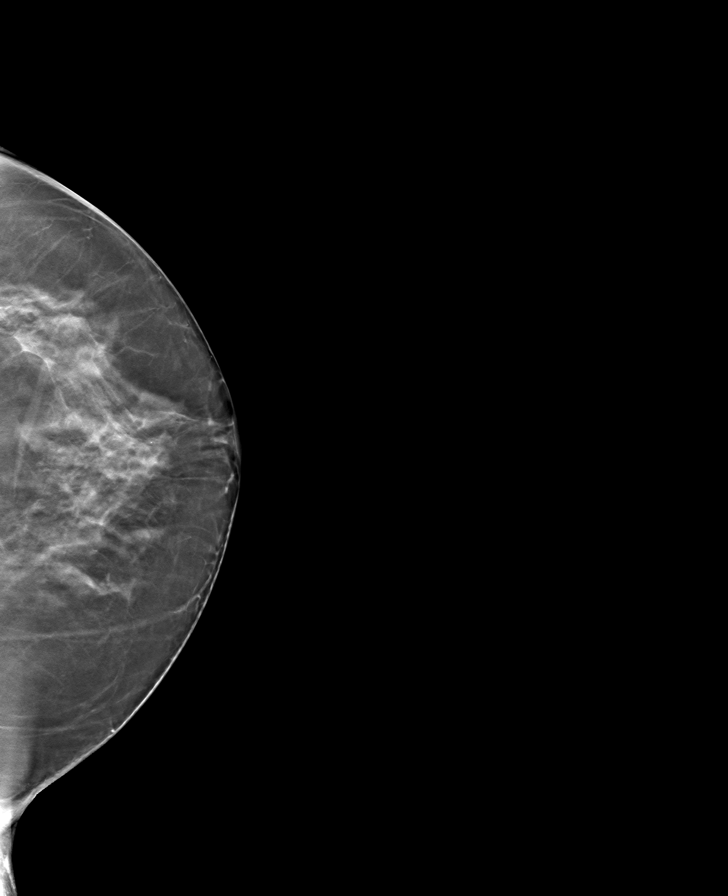

[R MLO tomo · tomo slice 35/70.0]
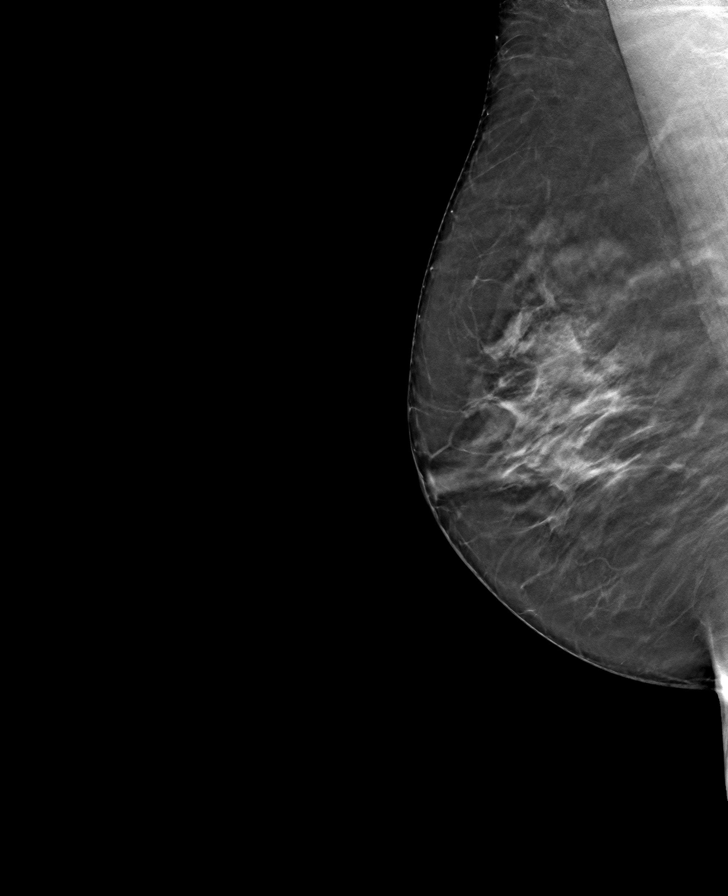

[R CC tomo · tomo slice 34/67.0]
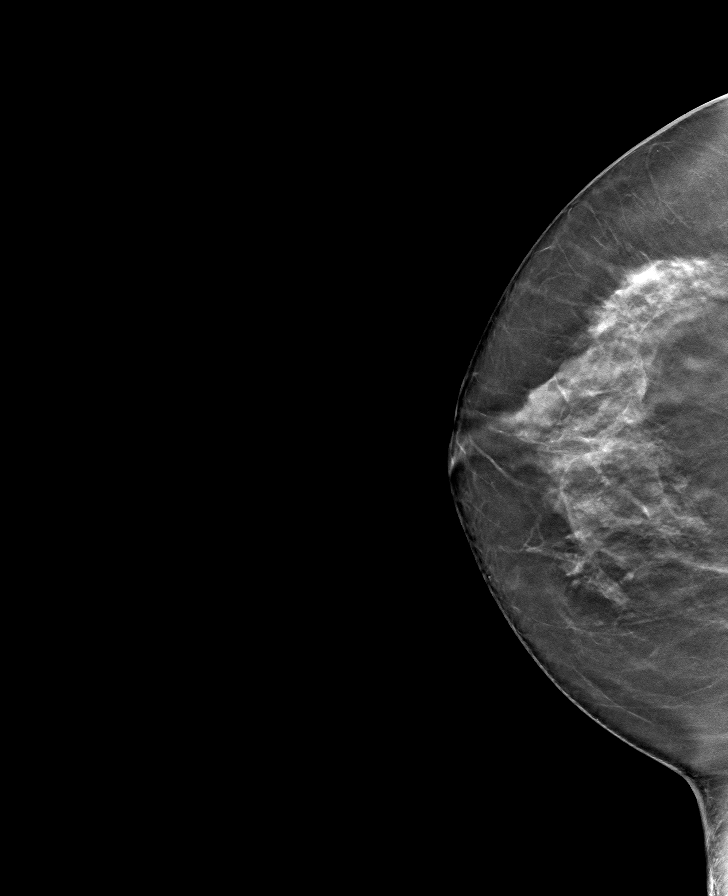

[8 of 24 positions shown; findings below may reference images not displayed]

ACR Breast Density Category c: The breast tissue is heterogeneously
dense, which may obscure small masses.
FINDINGS: There are no findings suspicious for malignancy. Images were
processed with CAD.
IMPRESSION: No mammographic evidence of malignancy. A result letter of this
screening mammogram will be mailed directly to the patient.

RECOMMENDATION:
Screening mammogram in one year. (Code:[5V])

BI-RADS CATEGORY  1: Negative.

## 2020-05-01 ENCOUNTER — Ambulatory Visit: Payer: Medicare Other | Attending: Internal Medicine

## 2020-05-01 DIAGNOSIS — Z23 Encounter for immunization: Secondary | ICD-10-CM

## 2020-05-01 NOTE — Progress Notes (Signed)
   Covid-19 Vaccination Clinic  Name:  Kathy Scott    MRN: [id number] DOB: 1951-03-18  05/01/2020  [name] was observed post Covid-19 immunization for 15 minutes without incident. She was provided with Vaccine Information Sheet and instruction to access the V-Safe system.   [name] was instructed to call 911 with any severe reactions post vaccine: Marland Kitchen Difficulty breathing  . Swelling of face and throat  . A fast heartbeat  . A bad rash all over body  . Dizziness and weakness

## 2020-07-28 DIAGNOSIS — M810 Age-related osteoporosis without current pathological fracture: Secondary | ICD-10-CM | POA: Diagnosis not present

## 2020-07-28 DIAGNOSIS — M25572 Pain in left ankle and joints of left foot: Secondary | ICD-10-CM | POA: Diagnosis not present

## 2020-07-28 DIAGNOSIS — Z79899 Other long term (current) drug therapy: Secondary | ICD-10-CM | POA: Diagnosis not present

## 2020-07-28 DIAGNOSIS — M109 Gout, unspecified: Secondary | ICD-10-CM | POA: Diagnosis not present

## 2020-07-28 DIAGNOSIS — M1990 Unspecified osteoarthritis, unspecified site: Secondary | ICD-10-CM | POA: Diagnosis not present

## 2020-07-28 DIAGNOSIS — M79641 Pain in right hand: Secondary | ICD-10-CM | POA: Diagnosis not present

## 2020-07-28 DIAGNOSIS — E039 Hypothyroidism, unspecified: Secondary | ICD-10-CM | POA: Diagnosis not present

## 2020-10-11 DIAGNOSIS — I10 Essential (primary) hypertension: Secondary | ICD-10-CM | POA: Diagnosis not present

## 2020-10-11 DIAGNOSIS — E039 Hypothyroidism, unspecified: Secondary | ICD-10-CM | POA: Diagnosis not present

## 2020-10-11 DIAGNOSIS — K219 Gastro-esophageal reflux disease without esophagitis: Secondary | ICD-10-CM | POA: Diagnosis not present

## 2020-12-06 ENCOUNTER — Ambulatory Visit: Payer: Medicare Other | Admitting: Diagnostic Neuroimaging

## 2020-12-06 ENCOUNTER — Telehealth: Payer: Self-pay | Admitting: Diagnostic Neuroimaging

## 2020-12-06 MED ORDER — LEVETIRACETAM ER 500 MG PO TB24
1000.0000 mg | ORAL_TABLET | Freq: Every day | ORAL | 1 refills | Status: DC
Start: 2020-12-06 — End: 2021-03-07

## 2020-12-06 NOTE — Telephone Encounter (Signed)
Levetiracetam refilled x 6 months to [facility].

## 2020-12-06 NOTE — Addendum Note (Signed)
Addended by: Minna Antis on: 12/06/2020 08:53 AM   Modules accepted: Orders

## 2020-12-06 NOTE — Telephone Encounter (Signed)
I called to reschedule appointment due to Dr. Leta Baptist being out and patient asked if we can call in her refill to [facility]

## 2020-12-23 DIAGNOSIS — K219 Gastro-esophageal reflux disease without esophagitis: Secondary | ICD-10-CM | POA: Diagnosis not present

## 2020-12-23 DIAGNOSIS — M19272 Secondary osteoarthritis, left ankle and foot: Secondary | ICD-10-CM | POA: Diagnosis not present

## 2020-12-23 DIAGNOSIS — E039 Hypothyroidism, unspecified: Secondary | ICD-10-CM | POA: Diagnosis not present

## 2020-12-23 DIAGNOSIS — M810 Age-related osteoporosis without current pathological fracture: Secondary | ICD-10-CM | POA: Diagnosis not present

## 2020-12-23 DIAGNOSIS — I10 Essential (primary) hypertension: Secondary | ICD-10-CM | POA: Diagnosis not present

## 2020-12-23 DIAGNOSIS — Z85038 Personal history of other malignant neoplasm of large intestine: Secondary | ICD-10-CM | POA: Diagnosis not present

## 2021-01-26 DIAGNOSIS — M25572 Pain in left ankle and joints of left foot: Secondary | ICD-10-CM | POA: Diagnosis not present

## 2021-01-26 DIAGNOSIS — M1990 Unspecified osteoarthritis, unspecified site: Secondary | ICD-10-CM | POA: Diagnosis not present

## 2021-01-26 DIAGNOSIS — E039 Hypothyroidism, unspecified: Secondary | ICD-10-CM | POA: Diagnosis not present

## 2021-01-26 DIAGNOSIS — M810 Age-related osteoporosis without current pathological fracture: Secondary | ICD-10-CM | POA: Diagnosis not present

## 2021-01-26 DIAGNOSIS — M109 Gout, unspecified: Secondary | ICD-10-CM | POA: Diagnosis not present

## 2021-01-26 DIAGNOSIS — Z79899 Other long term (current) drug therapy: Secondary | ICD-10-CM | POA: Diagnosis not present

## 2021-01-31 ENCOUNTER — Ambulatory Visit: Payer: Medicare Other | Admitting: Diagnostic Neuroimaging

## 2021-01-31 DIAGNOSIS — H2513 Age-related nuclear cataract, bilateral: Secondary | ICD-10-CM | POA: Diagnosis not present

## 2021-03-07 ENCOUNTER — Encounter: Payer: Self-pay | Admitting: Diagnostic Neuroimaging

## 2021-03-07 ENCOUNTER — Ambulatory Visit: Payer: Medicare Other | Admitting: Diagnostic Neuroimaging

## 2021-03-07 ENCOUNTER — Other Ambulatory Visit: Payer: Self-pay

## 2021-03-07 VITALS — BP 132/80 | HR 67 | Ht <= 58 in | Wt 152.2 lb

## 2021-03-07 DIAGNOSIS — G40209 Localization-related (focal) (partial) symptomatic epilepsy and epileptic syndromes with complex partial seizures, not intractable, without status epilepticus: Secondary | ICD-10-CM

## 2021-03-07 MED ORDER — LEVETIRACETAM ER 500 MG PO TB24
1000.0000 mg | ORAL_TABLET | Freq: Every day | ORAL | 4 refills | Status: DC
Start: 2021-03-07 — End: 2021-06-09

## 2021-03-07 NOTE — Progress Notes (Signed)
Chief Complaint  Patient presents with   Follow-up    Rm 7 alone- Here for 1 year f/u, denies any seizure activity.    History of Present Illness:  UPDATE (03/07/21, VRP): Since last visit, doing well. Symptoms are stable. No alleviating or aggravating factors. Tolerating levetiracetam.   UPDATE (12/03/19, VRP): Since last visit, doing well. Symptoms are stable. No alleviating or aggravating factors. Tolerating levetiracetam.    UPDATE (12/03/18, VRP): Since last visit, doing well. No seizures. Symptoms are stable. Tolerating LEV XR '1000mg'$  daily.    UPDATE (01/01/18, VRP): Since last visit, doing well. Tolerating meds. No alleviating or aggravating factors. No seizures. Mother has passed away (32 yrs old). 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 Lost some weight with "Next 56 days" program from [employer]    UPDATE 09/05/16: Since last visit, doing well. No seizures. Medication (Keppra XR '1000mg'$  in AM) doing well and covered by her insurance and retirement plan. Pt mother was in hospital last year (x 2), but now better. Has a new [employer] Watch and planning to start exercise program again this year.    Observations/Objective:  GENERAL EXAM/CONSTITUTIONAL: Vitals:  Vitals:   03/07/21 1618  BP: 132/80  Pulse: 67  SpO2: 95%  Weight: 152 lb 4 oz (69.1 kg)  Height: 4' 9.75" (1.467 m)   Body mass index is 32.1 kg/m. Wt Readings from Last 3 Encounters:  03/07/21 152 lb 4 oz (69.1 kg)  12/03/19 147 lb 6.4 oz (66.9 kg)  01/01/18 140 lb (63.5 kg)   Patient is in no distress; well developed, nourished and groomed; neck is supple  CARDIOVASCULAR: Examination of carotid arteries is normal; no carotid bruits Regular rate and rhythm, no murmurs Examination of peripheral vascular system by observation and palpation is normal  EYES: Ophthalmoscopic exam of optic discs and posterior segments is normal; no papilledema or hemorrhages No results found.  MUSCULOSKELETAL: Gait,  strength, tone, movements noted in Neurologic exam below  NEUROLOGIC: MENTAL STATUS:  No flowsheet data found. awake, alert, oriented to person, place and time recent and remote memory intact normal attention and concentration language fluent, comprehension intact, naming intact fund of knowledge appropriate  CRANIAL NERVE:  2nd - no papilledema on fundoscopic exam 2nd, 3rd, 4th, 6th - pupils equal and reactive to light, visual fields full to confrontation, extraocular muscles intact, no nystagmus 5th - facial sensation symmetric 7th - facial strength symmetric 8th - hearing intact 9th - palate elevates symmetrically, uvula midline 11th - shoulder shrug symmetric 12th - tongue protrusion midline  MOTOR:  normal bulk and tone, full strength in the BUE, BLE  SENSORY:  normal and symmetric to light touch  COORDINATION:  finger-nose-finger, fine finger movements normal  REFLEXES:  deep tendon reflexes present and symmetric  GAIT/STATION:  narrow based gait     Assessment and Plan:  70 y.o. female  has a past medical history of Cancer ([place]), Foot fracture, Hypertension, Hypothyroidism, and Seizures ([place]). here with seizure disorder, maintained for ~20 years on dilantin, now with osteoporosis. Successfully transitioned to levetiracetam XR 1000 mg daily, and off dilantin (since Sept 2014). Last seizure ~ 2011 (when off meds x 3 days).   Dx:  1. Partial symptomatic epilepsy with complex partial seizures, not intractable, without status epilepticus ([place])       SEIZURE DISORDER - continue LEV XR '1000mg'$  daily (generic ok) - reviewed health and wellness strategies (fitness)  Meds ordered this encounter  Medications   levETIRAcetam (KEPPRA XR)  500 MG 24 hr tablet    Sig: Take 2 tablets (1,000 mg total) by mouth daily.    Dispense:  180 tablet    Refill:  4     Follow Up Instructions:  - Return in about 1 year (around 03/07/2022) for with NP (Amy Lomax), virtual  visit (15 min).     Penni Bombard, MD [id number], [id number] PM Certified in Neurology, Neurophysiology and Neuroimaging  [facility] [address], [place], [place] [address] [phone]

## 2021-04-18 DIAGNOSIS — K219 Gastro-esophageal reflux disease without esophagitis: Secondary | ICD-10-CM | POA: Diagnosis not present

## 2021-04-18 DIAGNOSIS — I10 Essential (primary) hypertension: Secondary | ICD-10-CM | POA: Diagnosis not present

## 2021-04-18 DIAGNOSIS — Z0000 Encounter for general adult medical examination without abnormal findings: Secondary | ICD-10-CM | POA: Diagnosis not present

## 2021-04-18 DIAGNOSIS — Z23 Encounter for immunization: Secondary | ICD-10-CM | POA: Diagnosis not present

## 2021-04-18 DIAGNOSIS — M810 Age-related osteoporosis without current pathological fracture: Secondary | ICD-10-CM | POA: Diagnosis not present

## 2021-04-18 DIAGNOSIS — E039 Hypothyroidism, unspecified: Secondary | ICD-10-CM | POA: Diagnosis not present

## 2021-04-18 DIAGNOSIS — E559 Vitamin D deficiency, unspecified: Secondary | ICD-10-CM | POA: Diagnosis not present

## 2021-04-20 DIAGNOSIS — M19072 Primary osteoarthritis, left ankle and foot: Secondary | ICD-10-CM | POA: Diagnosis not present

## 2021-06-06 ENCOUNTER — Emergency Department (HOSPITAL_COMMUNITY): Payer: Medicare Other

## 2021-06-06 ENCOUNTER — Encounter (HOSPITAL_COMMUNITY): Payer: Self-pay | Admitting: Emergency Medicine

## 2021-06-06 ENCOUNTER — Inpatient Hospital Stay (HOSPITAL_COMMUNITY)
Admission: EM | Admit: 2021-06-06 | Discharge: 2021-06-09 | DRG: 100 | Disposition: A | Discharge status: Home Health | Payer: Medicare Other | Attending: Student | Admitting: Student

## 2021-06-06 DIAGNOSIS — R569 Unspecified convulsions: Secondary | ICD-10-CM | POA: Diagnosis not present

## 2021-06-06 DIAGNOSIS — Z800 Family history of malignant neoplasm of digestive organs: Secondary | ICD-10-CM

## 2021-06-06 DIAGNOSIS — G40909 Epilepsy, unspecified, not intractable, without status epilepticus: Secondary | ICD-10-CM | POA: Diagnosis present

## 2021-06-06 DIAGNOSIS — E872 Acidosis, unspecified: Secondary | ICD-10-CM | POA: Diagnosis not present

## 2021-06-06 DIAGNOSIS — R4182 Altered mental status, unspecified: Secondary | ICD-10-CM

## 2021-06-06 DIAGNOSIS — Z888 Allergy status to other drugs, medicaments and biological substances status: Secondary | ICD-10-CM

## 2021-06-06 DIAGNOSIS — S22069A Unspecified fracture of T7-T8 vertebra, initial encounter for closed fracture: Secondary | ICD-10-CM | POA: Diagnosis not present

## 2021-06-06 DIAGNOSIS — S32019A Unspecified fracture of first lumbar vertebra, initial encounter for closed fracture: Secondary | ICD-10-CM | POA: Diagnosis present

## 2021-06-06 DIAGNOSIS — E039 Hypothyroidism, unspecified: Secondary | ICD-10-CM | POA: Diagnosis present

## 2021-06-06 DIAGNOSIS — E669 Obesity, unspecified: Secondary | ICD-10-CM | POA: Diagnosis present

## 2021-06-06 DIAGNOSIS — M8588 Other specified disorders of bone density and structure, other site: Secondary | ICD-10-CM | POA: Diagnosis not present

## 2021-06-06 DIAGNOSIS — I110 Hypertensive heart disease with heart failure: Secondary | ICD-10-CM | POA: Diagnosis not present

## 2021-06-06 DIAGNOSIS — S22039A Unspecified fracture of third thoracic vertebra, initial encounter for closed fracture: Secondary | ICD-10-CM | POA: Diagnosis present

## 2021-06-06 DIAGNOSIS — Z20822 Contact with and (suspected) exposure to covid-19: Secondary | ICD-10-CM | POA: Diagnosis not present

## 2021-06-06 DIAGNOSIS — S22059A Unspecified fracture of T5-T6 vertebra, initial encounter for closed fracture: Secondary | ICD-10-CM | POA: Diagnosis not present

## 2021-06-06 DIAGNOSIS — M810 Age-related osteoporosis without current pathological fracture: Secondary | ICD-10-CM | POA: Diagnosis not present

## 2021-06-06 DIAGNOSIS — M6282 Rhabdomyolysis: Secondary | ICD-10-CM | POA: Diagnosis not present

## 2021-06-06 DIAGNOSIS — M545 Low back pain, unspecified: Secondary | ICD-10-CM | POA: Diagnosis not present

## 2021-06-06 DIAGNOSIS — M546 Pain in thoracic spine: Secondary | ICD-10-CM | POA: Diagnosis not present

## 2021-06-06 DIAGNOSIS — F05 Delirium due to known physiological condition: Secondary | ICD-10-CM | POA: Diagnosis present

## 2021-06-06 DIAGNOSIS — S01552A Open bite of oral cavity, initial encounter: Secondary | ICD-10-CM | POA: Diagnosis present

## 2021-06-06 DIAGNOSIS — E876 Hypokalemia: Secondary | ICD-10-CM | POA: Diagnosis present

## 2021-06-06 DIAGNOSIS — Z743 Need for continuous supervision: Secondary | ICD-10-CM | POA: Diagnosis not present

## 2021-06-06 DIAGNOSIS — S22008A Other fracture of unspecified thoracic vertebra, initial encounter for closed fracture: Secondary | ICD-10-CM | POA: Diagnosis not present

## 2021-06-06 DIAGNOSIS — I371 Nonrheumatic pulmonary valve insufficiency: Secondary | ICD-10-CM | POA: Diagnosis not present

## 2021-06-06 DIAGNOSIS — G40919 Epilepsy, unspecified, intractable, without status epilepticus: Secondary | ICD-10-CM | POA: Diagnosis not present

## 2021-06-06 DIAGNOSIS — Z85038 Personal history of other malignant neoplasm of large intestine: Secondary | ICD-10-CM | POA: Diagnosis not present

## 2021-06-06 DIAGNOSIS — Z6832 Body mass index (BMI) 32.0-32.9, adult: Secondary | ICD-10-CM

## 2021-06-06 DIAGNOSIS — Z79899 Other long term (current) drug therapy: Secondary | ICD-10-CM

## 2021-06-06 DIAGNOSIS — S32009A Unspecified fracture of unspecified lumbar vertebra, initial encounter for closed fracture: Secondary | ICD-10-CM | POA: Diagnosis not present

## 2021-06-06 DIAGNOSIS — R52 Pain, unspecified: Secondary | ICD-10-CM

## 2021-06-06 DIAGNOSIS — G40409 Other generalized epilepsy and epileptic syndromes, not intractable, without status epilepticus: Secondary | ICD-10-CM | POA: Diagnosis present

## 2021-06-06 DIAGNOSIS — Z79890 Hormone replacement therapy (postmenopausal): Secondary | ICD-10-CM

## 2021-06-06 DIAGNOSIS — J811 Chronic pulmonary edema: Secondary | ICD-10-CM | POA: Diagnosis not present

## 2021-06-06 DIAGNOSIS — Z87891 Personal history of nicotine dependence: Secondary | ICD-10-CM

## 2021-06-06 DIAGNOSIS — T796XXA Traumatic ischemia of muscle, initial encounter: Secondary | ICD-10-CM | POA: Diagnosis not present

## 2021-06-06 DIAGNOSIS — R6889 Other general symptoms and signs: Secondary | ICD-10-CM | POA: Diagnosis not present

## 2021-06-06 DIAGNOSIS — Z803 Family history of malignant neoplasm of breast: Secondary | ICD-10-CM | POA: Diagnosis not present

## 2021-06-06 DIAGNOSIS — I5033 Acute on chronic diastolic (congestive) heart failure: Secondary | ICD-10-CM | POA: Diagnosis not present

## 2021-06-06 DIAGNOSIS — D72829 Elevated white blood cell count, unspecified: Secondary | ICD-10-CM | POA: Diagnosis not present

## 2021-06-06 DIAGNOSIS — X58XXXA Exposure to other specified factors, initial encounter: Secondary | ICD-10-CM | POA: Diagnosis present

## 2021-06-06 DIAGNOSIS — Z8249 Family history of ischemic heart disease and other diseases of the circulatory system: Secondary | ICD-10-CM | POA: Diagnosis not present

## 2021-06-06 DIAGNOSIS — R404 Transient alteration of awareness: Secondary | ICD-10-CM | POA: Diagnosis not present

## 2021-06-06 DIAGNOSIS — D75839 Thrombocytosis, unspecified: Secondary | ICD-10-CM | POA: Diagnosis present

## 2021-06-06 DIAGNOSIS — D329 Benign neoplasm of meninges, unspecified: Secondary | ICD-10-CM | POA: Diagnosis present

## 2021-06-06 DIAGNOSIS — D72824 Basophilia: Secondary | ICD-10-CM | POA: Diagnosis not present

## 2021-06-06 DIAGNOSIS — I5031 Acute diastolic (congestive) heart failure: Secondary | ICD-10-CM | POA: Diagnosis not present

## 2021-06-06 DIAGNOSIS — J9601 Acute respiratory failure with hypoxia: Secondary | ICD-10-CM | POA: Diagnosis not present

## 2021-06-06 DIAGNOSIS — M4804 Spinal stenosis, thoracic region: Secondary | ICD-10-CM | POA: Diagnosis not present

## 2021-06-06 DIAGNOSIS — R5381 Other malaise: Secondary | ICD-10-CM | POA: Diagnosis not present

## 2021-06-06 DIAGNOSIS — T8853XD Unintended awareness under general anesthesia during procedure, subsequent encounter: Secondary | ICD-10-CM | POA: Diagnosis not present

## 2021-06-06 DIAGNOSIS — I499 Cardiac arrhythmia, unspecified: Secondary | ICD-10-CM | POA: Diagnosis not present

## 2021-06-06 DIAGNOSIS — R000 Tachycardia, unspecified: Secondary | ICD-10-CM | POA: Diagnosis not present

## 2021-06-06 LAB — COMPREHENSIVE METABOLIC PANEL
ALT: 27 U/L (ref 0–44)
AST: 42 U/L — ABNORMAL HIGH (ref 15–41)
Albumin: 3.8 g/dL (ref 3.5–5.0)
Alkaline Phosphatase: 116 U/L (ref 38–126)
Anion gap: 19 — ABNORMAL HIGH (ref 5–15)
BUN: 12 mg/dL (ref 8–23)
CO2: 15 mmol/L — ABNORMAL LOW (ref 22–32)
Calcium: 9.5 mg/dL (ref 8.9–10.3)
Chloride: 106 mmol/L (ref 98–111)
Creatinine, Ser: 1.19 mg/dL — ABNORMAL HIGH (ref 0.44–1.00)
GFR, Estimated: 49 mL/min — ABNORMAL LOW (ref >60)
Glucose, Bld: 237 mg/dL — ABNORMAL HIGH (ref 70–99)
Potassium: 2.3 mmol/L — CL (ref 3.5–5.1)
Sodium: 140 mmol/L (ref 135–145)
Total Bilirubin: 0.5 mg/dL (ref 0.3–1.2)
Total Protein: 8 g/dL (ref 6.5–8.1)

## 2021-06-06 LAB — CBC WITH DIFFERENTIAL/PLATELET
Abs Immature Granulocytes: 0.54 10*3/uL — ABNORMAL HIGH (ref 0.00–0.07)
Basophils Absolute: 0.1 10*3/uL (ref 0.0–0.1)
Basophils Relative: 0 %
Eosinophils Absolute: 0 10*3/uL (ref 0.0–0.5)
Eosinophils Relative: 0 %
HCT: 40.7 % (ref 36.0–46.0)
Hemoglobin: 13.7 g/dL (ref 12.0–15.0)
Immature Granulocytes: 2 %
Lymphocytes Relative: 6 %
Lymphs Abs: 1.4 10*3/uL (ref 0.7–4.0)
MCH: 31.7 pg (ref 26.0–34.0)
MCHC: 33.7 g/dL (ref 30.0–36.0)
MCV: 94.2 fL (ref 80.0–100.0)
Monocytes Absolute: 1.2 10*3/uL — ABNORMAL HIGH (ref 0.1–1.0)
Monocytes Relative: 5 %
Neutro Abs: 20.3 10*3/uL — ABNORMAL HIGH (ref 1.7–7.7)
Neutrophils Relative %: 87 %
Platelets: 432 10*3/uL — ABNORMAL HIGH (ref 150–400)
RBC: 4.32 MIL/uL (ref 3.87–5.11)
RDW: 13.4 % (ref 11.5–15.5)
WBC: 23.4 10*3/uL — ABNORMAL HIGH (ref 4.0–10.5)
nRBC: 0 % (ref 0.0–0.2)

## 2021-06-06 LAB — URINALYSIS, ROUTINE W REFLEX MICROSCOPIC
Bacteria, UA: NONE SEEN
Bilirubin Urine: NEGATIVE
Glucose, UA: NEGATIVE mg/dL
Ketones, ur: 5 mg/dL — AB
Leukocytes,Ua: NEGATIVE
Nitrite: NEGATIVE
Protein, ur: 100 mg/dL — AB
Specific Gravity, Urine: 1.019 (ref 1.005–1.030)
pH: 5 (ref 5.0–8.0)

## 2021-06-06 LAB — ETHANOL: Alcohol, Ethyl (B): <10 mg/dL (ref <10)

## 2021-06-06 LAB — AMMONIA: Ammonia: 36 umol/L — ABNORMAL HIGH (ref 9–35)

## 2021-06-06 LAB — LACTIC ACID, PLASMA: Lactic Acid, Venous: >9 mmol/L (ref 0.5–1.9)

## 2021-06-06 LAB — CBG MONITORING, ED: Glucose-Capillary: 203 mg/dL — ABNORMAL HIGH (ref 70–99)

## 2021-06-06 IMAGING — DX DG CHEST 1V PORT
1 series · 1 of 1 positions shown · non-contrast
Comparison: [DATE]

CLINICAL DATA: Altered mental status

EXAM:
PORTABLE CHEST 1 VIEW

[chest]
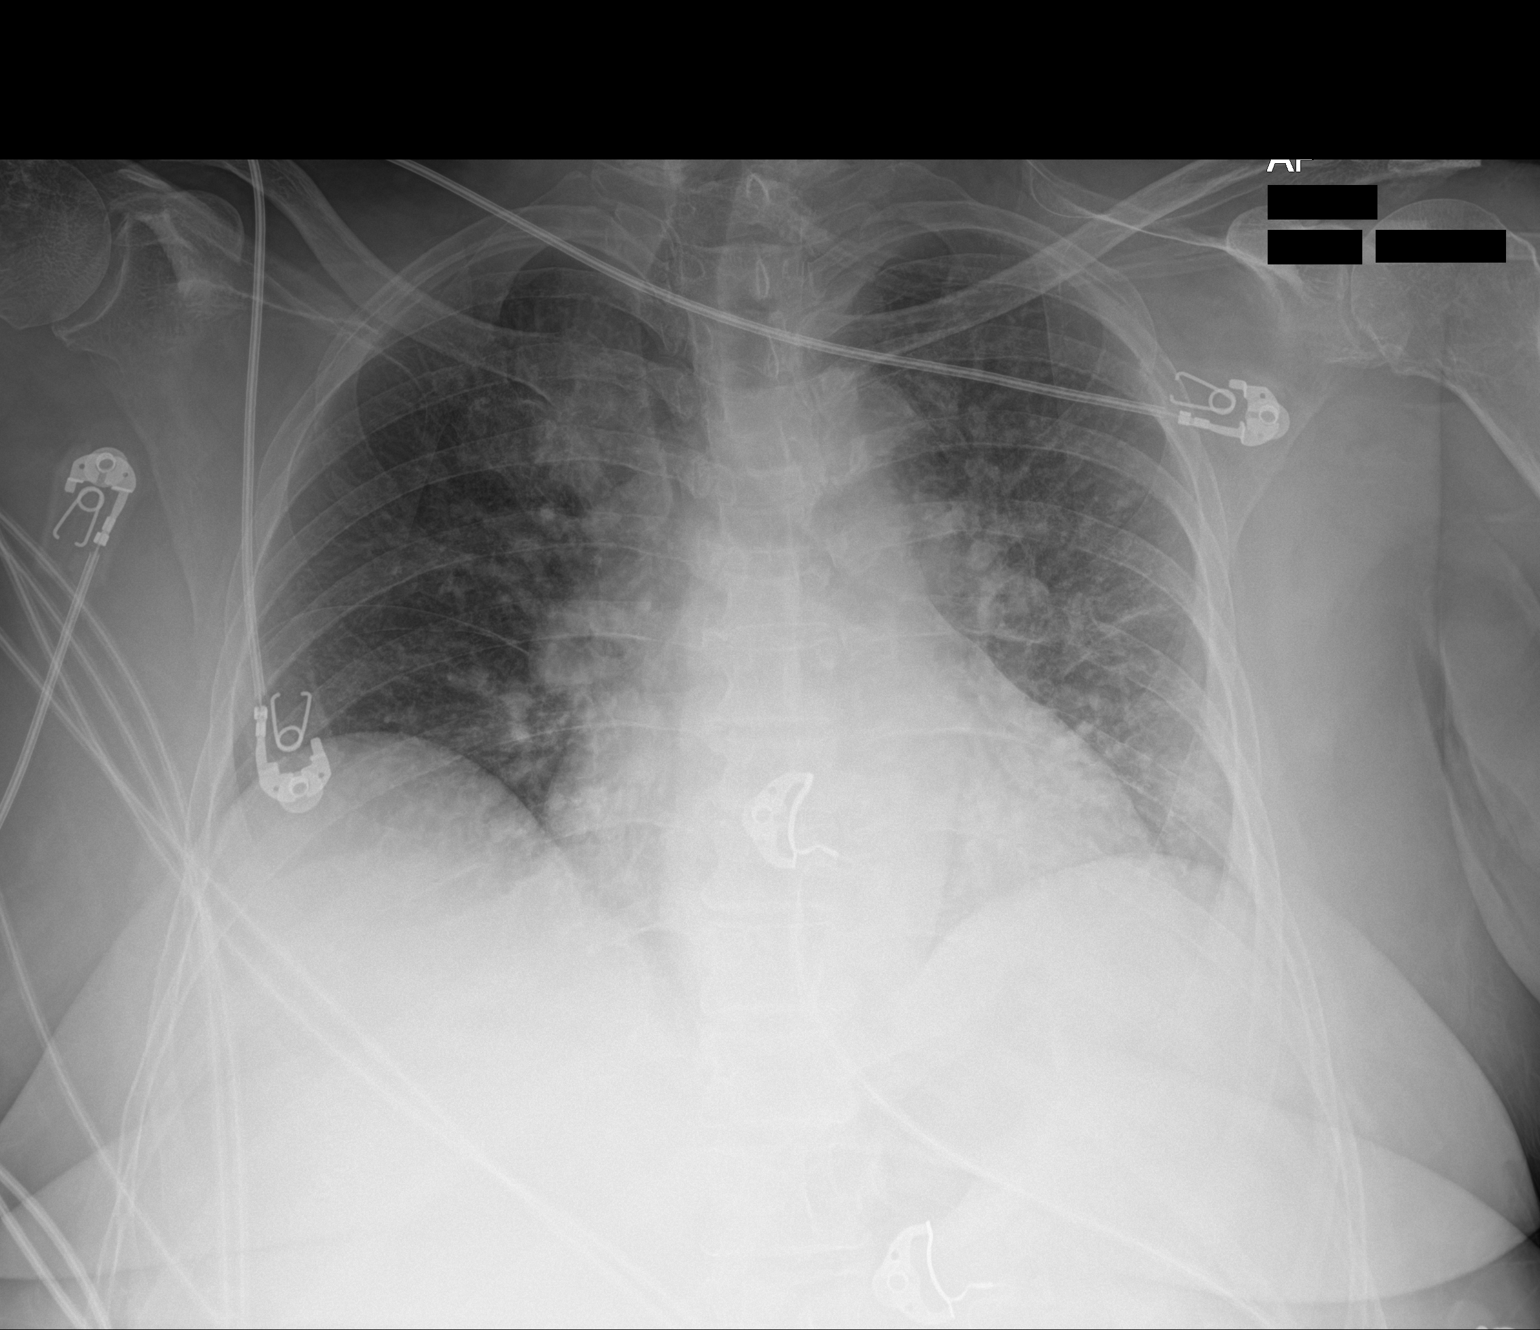

[1 of 1 positions shown; findings below may reference images not displayed]

FINDINGS: Heart size within normal limits. Low lung volumes. Mild pulmonary
vascular congestion with increased interstitial markings
bilaterally. No pleural effusion or pneumothorax.
IMPRESSION: Mild pulmonary vascular congestion with increased interstitial
markings bilaterally, may reflect mild interstitial edema.

## 2021-06-06 IMAGING — CT CT HEAD W/O CM
4 series · 16 of 47 positions shown, 18 images · non-contrast
Comparison: None.

CLINICAL DATA: Mental status change, unknown cause

EXAM:
CT HEAD WITHOUT CONTRAST
TECHNIQUE: Contiguous axial images were obtained from the base of the skull
through the vertex without intravenous contrast.

[Series 3: head wo · axial · 0.43mm/px · z∈[+1368,+1488]mm · 7 of 32 slices shown, 9 images]
[im 4/32  brain]
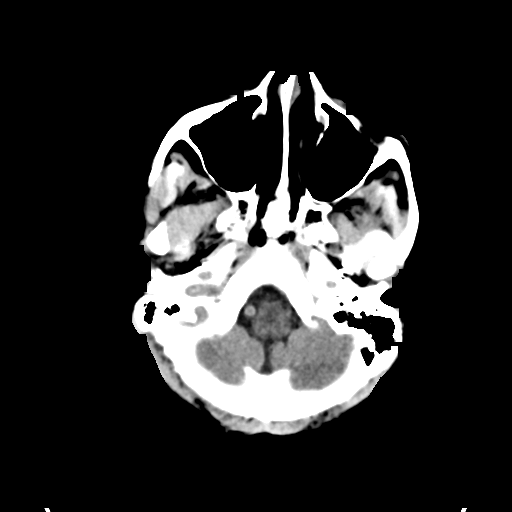
[im 4/32  bone]
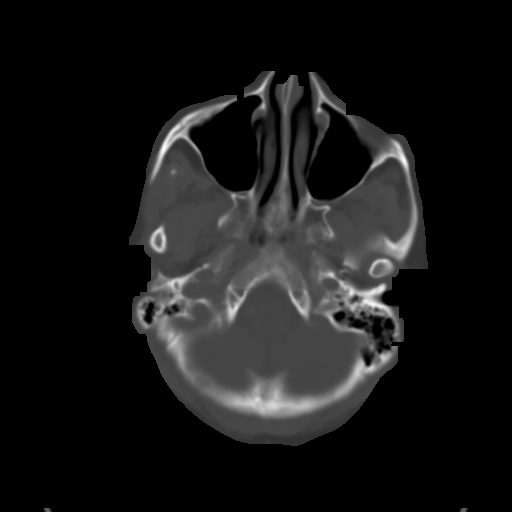
[im 8/32  brain]
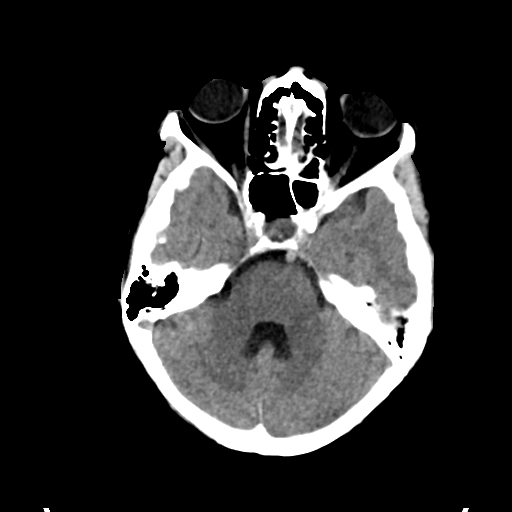
[im 12/32  brain]
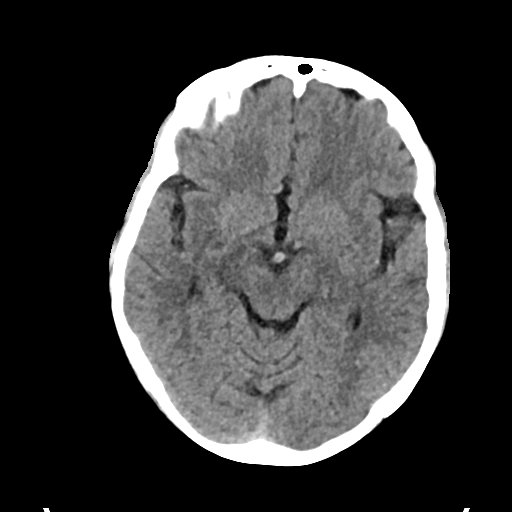
[im 16/32  brain]
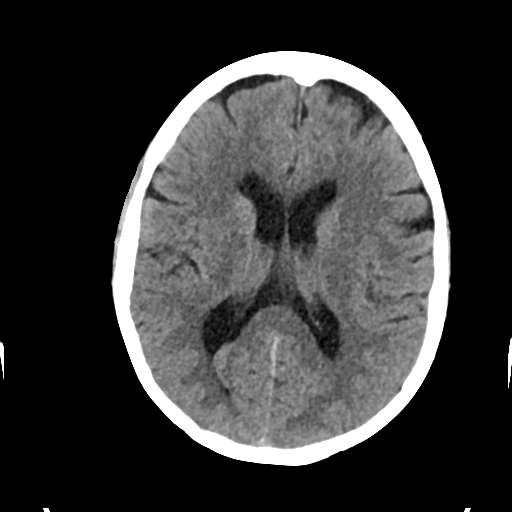
[im 20/32  brain]
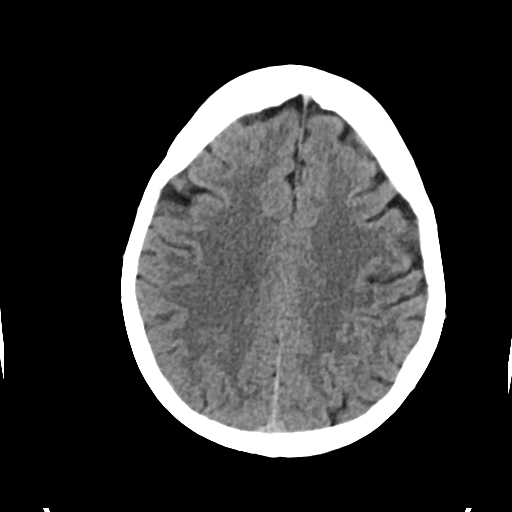
[im 20/32  bone]
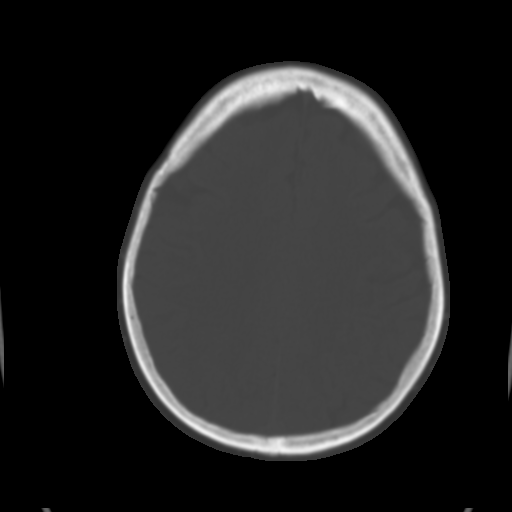
[im 24/32  brain]
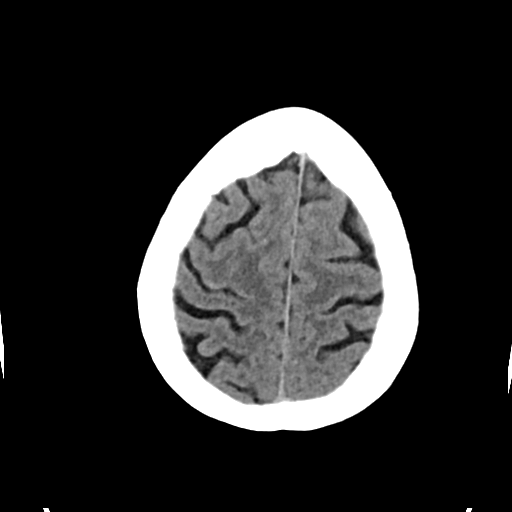
[im 28/32  brain]
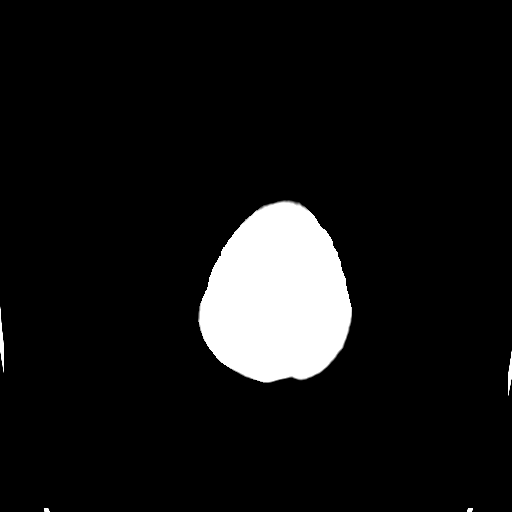

[Series 4: head bone · axial · 0.43mm/px · z∈[+1366,+1398]mm · 3 of 79 slices shown]
[im 8/79  bone]
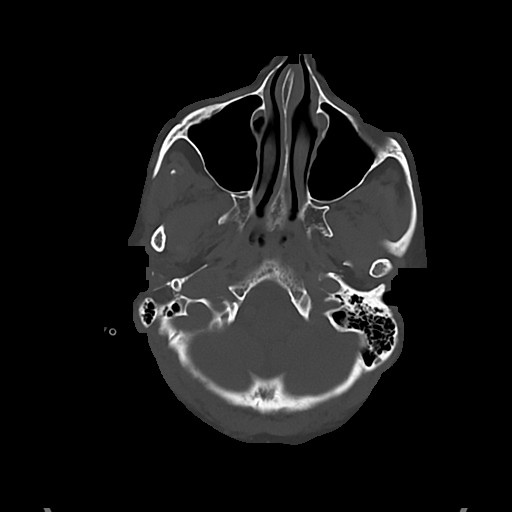
[im 16/79  bone]
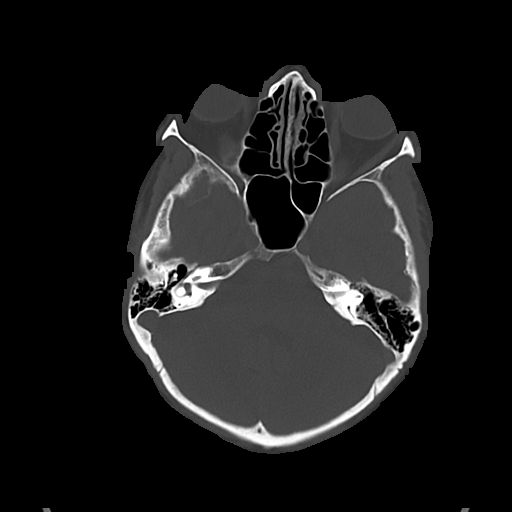
[im 24/79  bone]
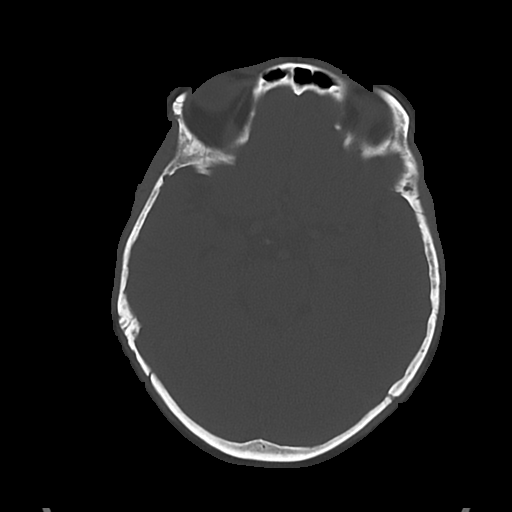

[Series 5: cor soft · coronal · 0.31mm/px · 3 of 65 slices shown]
[im 22/65  brain]
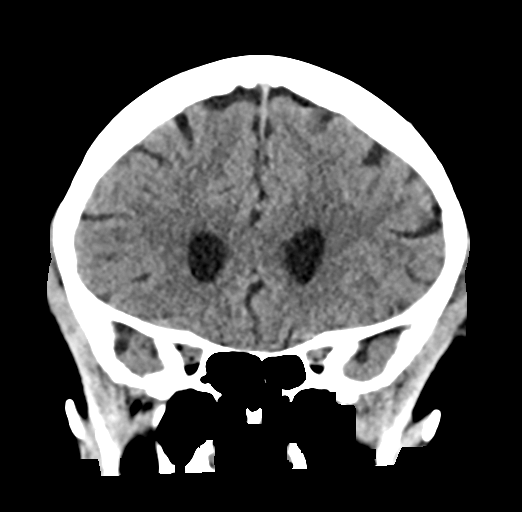
[im 29/65  brain]
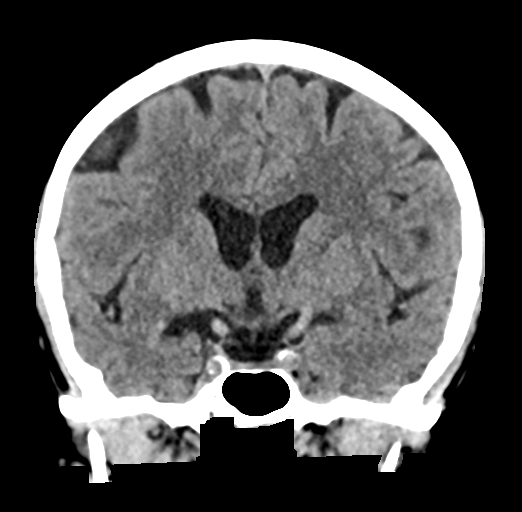
[im 36/65  brain]
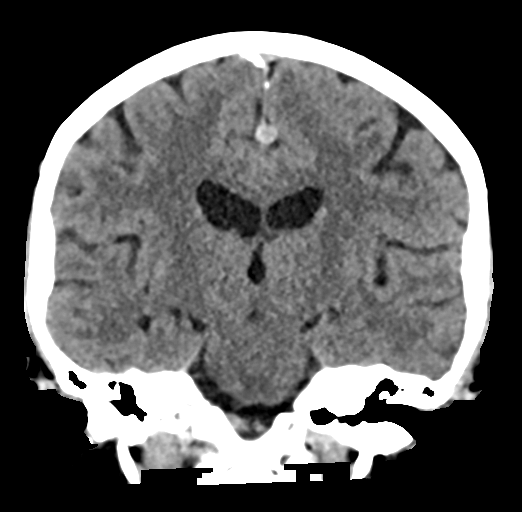

[Series 6: sag soft · sagittal · 0.29mm/px · 3 of 52 slices shown]
[im 18/52  brain]
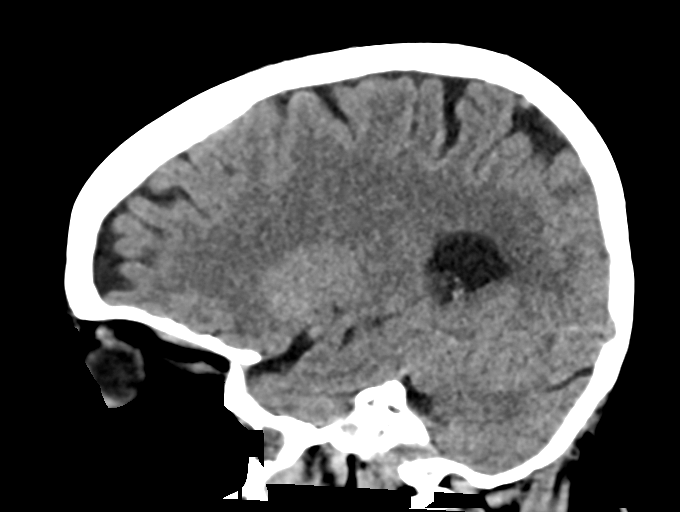
[im 26/52  brain]
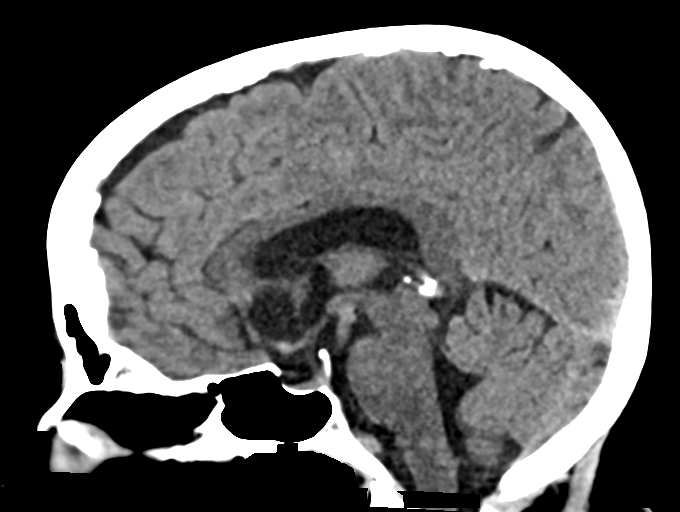
[im 35/52  brain]
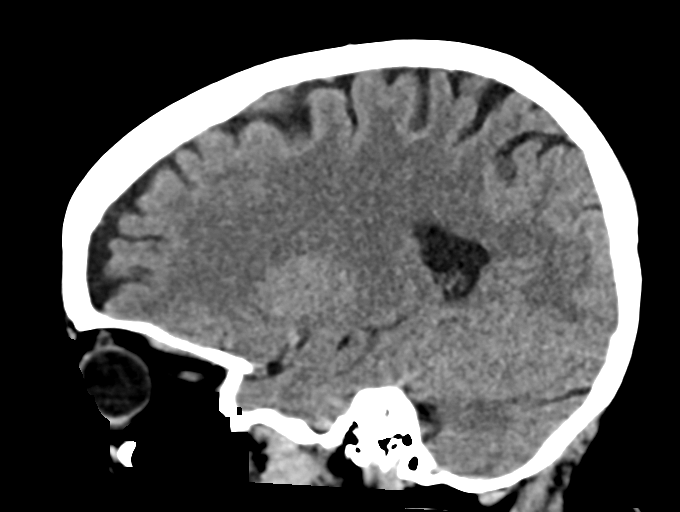

[16 of 47 positions shown; findings below may reference images not displayed]

FINDINGS: Brain: There is no acute intracranial hemorrhage, mass effect, or
edema. Gray-white differentiation is preserved. There is no
extra-axial fluid collection. Small subcentimeter mildly hyperdense
lesion along the falx likely represents a meningioma. Ventricles and
sulci are within normal limits in size and configuration.

Vascular: There is atherosclerotic calcification at the skull base.

Skull: Calvarium is unremarkable.

Sinuses/Orbits: No acute finding.

Other: None.
IMPRESSION: No acute intracranial abnormality.

Small subcentimeter falcine meningioma.

## 2021-06-06 MED ORDER — POTASSIUM CHLORIDE CRYS ER 20 MEQ PO TBCR
40.0000 meq | EXTENDED_RELEASE_TABLET | Freq: Once | ORAL | Status: AC
  Administered 2021-06-06: 40 meq via ORAL
  Filled 2021-06-06: qty 2

## 2021-06-06 MED ORDER — LORAZEPAM 2 MG/ML IJ SOLN
0.5000 mg | Freq: Once | INTRAMUSCULAR | Status: AC | PRN
  Administered 2021-06-06: 0.5 mg via INTRAVENOUS
  Filled 2021-06-06: qty 1

## 2021-06-06 MED ORDER — LORAZEPAM 2 MG/ML IJ SOLN
1.0000 mg | Freq: Once | INTRAMUSCULAR | Status: AC | PRN
  Administered 2021-06-07: 1 mg via INTRAVENOUS
  Filled 2021-06-06: qty 1

## 2021-06-06 MED ORDER — POTASSIUM CHLORIDE 10 MEQ/100ML IV SOLN
10.0000 meq | INTRAVENOUS | Status: AC
  Administered 2021-06-06 (×2): 10 meq via INTRAVENOUS
  Filled 2021-06-06 (×2): qty 100

## 2021-06-06 MED ORDER — LEVETIRACETAM IN NACL 1000 MG/100ML IV SOLN
1000.0000 mg | Freq: Once | INTRAVENOUS | Status: AC
  Administered 2021-06-06: 1000 mg via INTRAVENOUS
  Filled 2021-06-06: qty 100

## 2021-06-06 NOTE — ED Notes (Signed)
ED Provider at bedside. 

## 2021-06-06 NOTE — ED Notes (Signed)
MRI sent pt back to the department because she is on the table and will not listen to instructions or follow commands. This RN offered to bring meds over but they are sending her back. Pt received the Ativan about 45 minutes prior to going to MRI. They did not complete the exam because she would not lay still for completion. MD notified and additional orders received. Will continue to monitor. Pt in room at this time.

## 2021-06-06 NOTE — ED Provider Notes (Signed)
3:16 PM Care assumed from Dr. Melina Copa.  At time of transfer care, patient is waiting for urinalysis.  Patient is going to receive a load of Keppra that was ordered by previous team and it was questionable compliance with previous seizure medications.  Patient does have a history of seizures and if they are back to baseline after urinalysis completed, suspect they will be stable for discharge home per previous plan.  7:29 PM I just reassessed the patient with her family at bedside.  They feel that she normally very talkative and still not talking like normal.  As it has been over 7 hours since her seizure although she is getting closer to baseline, her speech and mental status are still not back to baseline.  We will get MRI to look for stroke or other acute abnormality.  She is still waiting on the urinalysis and she has not urinated so we will wait for that result.  Anticipate disposition based on her MRI.  Care transferred to oncoming team to await reassessment after MRI.  Anticipate discussion with neurology patient is still not back to baseline or if MRI shows any concern for stroke given the patient's change in speech this evening.   Shaundrea Carrigg, Gwenyth Allegra, MD 06/06/21 [phone]

## 2021-06-06 NOTE — ED Notes (Signed)
Remains A/Ox 4, no seizure activity noted

## 2021-06-06 NOTE — ED Notes (Signed)
Patient transported to MRI 

## 2021-06-06 NOTE — ED Notes (Signed)
Family updated as to patient's status.

## 2021-06-06 NOTE — ED Provider Notes (Signed)
[facility] EMERGENCY DEPARTMENT Provider Note   CSN: [id number] Arrival date & time: 06/06/21  1207     History Chief Complaint  Patient presents with   Seizures    Kathy Scott is a 70 y.o. female.  Here for altered mental status.  Coworker tried to call her last night and she did not answer the phone.  He called her this morning and thought she sounded altered.  He called 911 for her.  EMS said she seemed a little altered when they got there and then when he stood her up to go to the stretcher she had a seizure lasting about 20 seconds.  She was postictal then.  Currently she is awake and denies any complaints.  The history is provided by the patient and the EMS personnel.  Seizures Seizure activity on arrival: no   Seizure type:  Tonic Initial focality:  Unable to specify Episode characteristics: generalized shaking   Postictal symptoms: confusion   Duration:  20 seconds Timing:  Once Progression:  Resolved PTA treatment:  None History of seizures: yes   Altered Mental Status Presenting symptoms: confusion   Associated symptoms: seizures   Associated symptoms: no abdominal pain, no fever and no rash       Past Medical History:  Diagnosis Date   Cancer ([place])    Colon (Stage 3)   Foot fracture    corrected non surgically   Hypertension    Hypothyroidism    Osteoporosis    Seizures ([place])    last sz ~ 2014    Patient Active Problem List   Diagnosis Date Noted   Partial symptomatic epilepsy with complex partial seizures, not intractable, without status epilepticus ([place]) 09/01/2014   Osteoporosis, unspecified 12/23/2012    Past Surgical History:  Procedure Laterality Date   [place]  06/2018   [facility], replacement   COLON SURGERY  2003   TUBAL LIGATION  1995     OB History   No obstetric history on file.     Family History  Problem Relation Age of Onset   Heart failure Mother    Diverticulitis  Mother    Colon cancer Other        Maternal & Paternal   Hypotension Other    Breast cancer Other        Aunt   Diverticulitis Brother    Hypotension Brother     Social History   Tobacco Use   Smoking status: Former    Packs/day: 0.10    Years: 20.00    Pack years: 2.00    Types: Cigarettes   Smokeless tobacco: Never  Substance Use Topics   Alcohol use: Yes    Comment: occasionally   Drug use: No    Home Medications Prior to Admission medications   Medication Sig Start Date End Date Taking? Authorizing Provider  allopurinol (ZYLOPRIM) 300 MG tablet Take 300 mg by mouth daily. 01/26/21   [provider]  amLODipine (NORVASC) 10 MG tablet Take 10 mg by mouth daily.    [provider]  calcium-vitamin D (OSCAL WITH D) 500-200 MG-UNIT per tablet Take 2 tablets by mouth.    [provider]  Cholecalciferol (VITAMIN D3) 1000 units CAPS Take by mouth.    [provider]  levETIRAcetam (KEPPRA XR) 500 MG 24 hr tablet Take 2 tablets (1,000 mg total) by mouth daily. 03/07/21   Penumalli, Earlean Polka, MD  Magnesium 500 MG  CAPS Take 500 mg by mouth daily.    [provider]  metoprolol succinate (TOPROL-XL) 50 MG 24 hr tablet 150 mg daily. 10/01/18   [provider]  Multiple Vitamins-Minerals (MULTIVITAMIN ADULT PO) Take 1 tablet by mouth daily.    [provider]  omeprazole (PRILOSEC) 40 MG capsule Take 40 mg by mouth daily.    [provider]  predniSONE (DELTASONE) 5 MG tablet 2 tablets    [provider]  SYNTHROID 50 MCG tablet Take 1 tablet by mouth daily. 10/23/12   [provider]  valsartan-hydrochlorothiazide (DIOVAN-HCT) 320-25 MG tablet  09/04/16   [provider]  vitamin B-12 (CYANOCOBALAMIN) 500 MCG tablet Take 500 mcg by mouth daily.    [provider]    Allergies    Ibandronic acid, Other, and Raloxifene  Review of Systems   Review of Systems  Constitutional:   Negative for fever.  HENT:  Negative for sore throat.   Eyes:  Negative for visual disturbance.  Respiratory:  Negative for shortness of breath.   Cardiovascular:  Negative for chest pain.  Gastrointestinal:  Negative for abdominal pain.  Genitourinary:  Negative for dysuria.  Musculoskeletal:  Negative for neck pain.  Skin:  Negative for rash.  Neurological:  Positive for seizures.  Psychiatric/Behavioral:  Positive for confusion.    Physical Exam Updated Vital Signs BP (!) 168/71 (BP Location: Right Arm)   Pulse (!) 109   Temp 98.2 F (36.8 C) (Oral)   Resp 18   SpO2 95%   Physical Exam Vitals and nursing note reviewed.  Constitutional:      General: She is not in acute distress.    Appearance: Normal appearance. She is well-developed.  HENT:     Head: Normocephalic and atraumatic.     Mouth/Throat:     Comments: She has what looks like to be a little bit of dried blood around her mouth.  No signs of active bleeding. Eyes:     Conjunctiva/sclera: Conjunctivae normal.  Cardiovascular:     Rate and Rhythm: Regular rhythm. Tachycardia present.     Heart sounds: No murmur heard. Pulmonary:     Effort: Pulmonary effort is normal. Tachypnea present. No respiratory distress.     Breath sounds: Normal breath sounds.  Abdominal:     Palpations: Abdomen is soft.     Tenderness: There is no abdominal tenderness.  Musculoskeletal:        General: No swelling, deformity or signs of injury. Normal range of motion.     Cervical back: Neck supple.  Skin:    General: Skin is warm and dry.     Capillary Refill: Capillary refill takes less than 2 seconds.  Neurological:     General: No focal deficit present.     Mental Status: She is alert.     Cranial Nerves: No cranial nerve deficit.     Sensory: No sensory deficit.     Motor: No weakness.     Comments: She is awake and alert although seems slow to answer.  Answers appropriately.  No focal findings on exam.  Psychiatric:         Mood and Affect: Mood normal.    ED Results / Procedures / Treatments   Labs (all labs ordered are listed, but only abnormal results are displayed) Labs Reviewed  CBC WITH DIFFERENTIAL/PLATELET - Abnormal; Notable for the following components:      Result Value   WBC 23.4 (*)    Platelets  432 (*)    Neutro Abs 20.3 (*)    Monocytes Absolute 1.2 (*)    Abs Immature Granulocytes 0.54 (*)    All other components within normal limits  COMPREHENSIVE METABOLIC PANEL - Abnormal; Notable for the following components:   Potassium 2.3 (*)    CO2 15 (*)    Glucose, Bld 237 (*)    Creatinine, Ser 1.19 (*)    AST 42 (*)    GFR, Estimated 49 (*)    Anion gap 19 (*)    All other components within normal limits  LACTIC ACID, PLASMA - Abnormal; Notable for the following components:   Lactic Acid, Venous >9.0 (*)    All other components within normal limits  AMMONIA - Abnormal; Notable for the following components:   Ammonia 36 (*)    All other components within normal limits  CBG MONITORING, ED - Abnormal; Notable for the following components:   Glucose-Capillary 203 (*)    All other components within normal limits  ETHANOL  URINALYSIS, ROUTINE W REFLEX MICROSCOPIC    EKG EKG Interpretation  Date/Time:  Monday June 06 2021 12:07:52 EST Ventricular Rate:  109 PR Interval:  173 QRS Duration: 99 QT Interval:  368 QTC Calculation: 496 R Axis:   35 Text Interpretation: Sinus tachycardia Consider right atrial enlargement Borderline ST depression, lateral leads Minimal ST elevation, inferior leads Borderline prolonged QT interval rate faster than prior 7/12 Confirmed by Aletta Edouard [phone]) on 06/06/2021 12:11:06 PM  Radiology CT Head Wo Contrast  Result Date: 06/06/2021 CLINICAL DATA:  Mental status change, unknown cause EXAM: CT HEAD WITHOUT CONTRAST TECHNIQUE: Contiguous axial images were obtained from the base of the skull through the vertex without intravenous contrast.  COMPARISON:  None. FINDINGS: Brain: There is no acute intracranial hemorrhage, mass effect, or edema. Gray-white differentiation is preserved. There is no extra-axial fluid collection. Small subcentimeter mildly hyperdense lesion along the falx likely represents a meningioma. Ventricles and sulci are within normal limits in size and configuration. Vascular: There is atherosclerotic calcification at the skull base. Skull: Calvarium is unremarkable. Sinuses/Orbits: No acute finding. Other: None. IMPRESSION: No acute intracranial abnormality. Small subcentimeter falcine meningioma. Electronically Signed   By: Macy Mis M.D.   On: 06/06/2021 13:59   DG Chest Port 1 View  Result Date: 06/06/2021 CLINICAL DATA:  Altered mental status EXAM: PORTABLE CHEST 1 VIEW COMPARISON:  11/27/2008 FINDINGS: Heart size within normal limits. Low lung volumes. Mild pulmonary vascular congestion with increased interstitial markings bilaterally. No pleural effusion or pneumothorax. IMPRESSION: Mild pulmonary vascular congestion with increased interstitial markings bilaterally, may reflect mild interstitial edema. Electronically Signed   By: Davina Poke D.O.   On: 06/06/2021 12:51    Procedures Procedures   Medications Ordered in ED Medications  potassium chloride SA (KLOR-CON) CR tablet 40 mEq (40 mEq Oral Given 06/06/21 1532)  levETIRAcetam (KEPPRA) IVPB 1000 mg/100 mL premix (0 mg Intravenous Stopped 06/06/21 1557)    ED Course  I have reviewed the triage vital signs and the nursing notes.  Pertinent labs & imaging results that were available during my care of the patient were reviewed by me and considered in my medical decision making (see chart for details).    MDM Rules/Calculators/A&P                          This patient complains of seizure, possible altered mental status; this involves an extensive number of treatment Options and  is a complaint that carries with it a high risk of complications  and Morbidity. The differential includes seizure, postictal state, medication noncompliance, infection, metabolic derangement, intracranial lesion, head bleed  I ordered, reviewed and interpreted labs, which included CBC with elevated white count, question reactive versus infectious, chemistries with low sodium low bicarb elevated glucose elevated creatinine elevated anion gap possibly reflecting her recent seizure.  Lactate elevated although no clear signs of infection and no patient complaints regarding that.  Ammonia mildly elevated I ordered medication oral potassium and IV Keppra load I ordered imaging studies which included chest x-ray and head CT and I independently    visualized and interpreted imaging which showed mild vascular congestion, no acute CNS findings Additional history obtained from EMS, patient's cousin, patient's brother Previous records obtained and reviewed In epic, saw neurology about 3 months ago  After the interventions stated above, I reevaluated the patient and found patient still to be slow to answer but otherwise neuro intact.  Her care is signed out to oncoming provider Dr. Sherry Ruffing to follow-up on results of urinalysis and see if she has continuing improvement in her neuro status.  If not she may need a neurology consult and admission for further work-up.   Final Clinical Impression(s) / ED Diagnoses Final diagnoses:  Seizure ([place])  Altered mental status, unspecified altered mental status type  Hypokalemia    Rx / DC Orders ED Discharge Orders     None        Hayden Rasmussen, MD 06/06/21 1753

## 2021-06-06 NOTE — ED Notes (Signed)
Brother has gone home at this time. Pt continues to be disoriented, attempting to pull at gown and BP cuff. Pt reoriented and agrees to leave things alone. Pt resting on stretcher, waiting for MRI. Will continue to monitor.

## 2021-06-06 NOTE — ED Triage Notes (Signed)
Coworker called this am noted patient with AMS on phone. Witnessed seizure by [employer] when stood patient to get to stretcher, tonic clonic lasting approximately 20 seconds then post ictal. No fall, color good, small amount dried blood at mouth. Oriented to year,day and event.

## 2021-06-06 NOTE — ED Notes (Signed)
MRI notified that pt will return when they are ready and meds given prior to departing unit when transport arrives.

## 2021-06-07 ENCOUNTER — Observation Stay (HOSPITAL_COMMUNITY): Payer: Medicare Other

## 2021-06-07 ENCOUNTER — Other Ambulatory Visit: Payer: Self-pay

## 2021-06-07 ENCOUNTER — Emergency Department (HOSPITAL_COMMUNITY): Payer: Medicare Other

## 2021-06-07 ENCOUNTER — Encounter (HOSPITAL_COMMUNITY): Payer: Self-pay | Admitting: Internal Medicine

## 2021-06-07 DIAGNOSIS — E039 Hypothyroidism, unspecified: Secondary | ICD-10-CM | POA: Diagnosis not present

## 2021-06-07 DIAGNOSIS — D75839 Thrombocytosis, unspecified: Secondary | ICD-10-CM

## 2021-06-07 DIAGNOSIS — F05 Delirium due to known physiological condition: Secondary | ICD-10-CM | POA: Diagnosis present

## 2021-06-07 DIAGNOSIS — G40919 Epilepsy, unspecified, intractable, without status epilepticus: Secondary | ICD-10-CM | POA: Diagnosis not present

## 2021-06-07 DIAGNOSIS — M545 Low back pain, unspecified: Secondary | ICD-10-CM | POA: Diagnosis not present

## 2021-06-07 DIAGNOSIS — E876 Hypokalemia: Secondary | ICD-10-CM

## 2021-06-07 DIAGNOSIS — D72829 Elevated white blood cell count, unspecified: Secondary | ICD-10-CM

## 2021-06-07 DIAGNOSIS — E872 Acidosis, unspecified: Secondary | ICD-10-CM | POA: Diagnosis not present

## 2021-06-07 DIAGNOSIS — M546 Pain in thoracic spine: Secondary | ICD-10-CM

## 2021-06-07 DIAGNOSIS — S01552A Open bite of oral cavity, initial encounter: Secondary | ICD-10-CM | POA: Diagnosis present

## 2021-06-07 DIAGNOSIS — S22000A Wedge compression fracture of unspecified thoracic vertebra, initial encounter for closed fracture: Secondary | ICD-10-CM

## 2021-06-07 DIAGNOSIS — D329 Benign neoplasm of meninges, unspecified: Secondary | ICD-10-CM

## 2021-06-07 LAB — CBC WITH DIFFERENTIAL/PLATELET
Abs Immature Granulocytes: 0.1 10*3/uL — ABNORMAL HIGH (ref 0.00–0.07)
Basophils Absolute: 0 10*3/uL (ref 0.0–0.1)
Basophils Relative: 0 %
Eosinophils Absolute: 0.1 10*3/uL (ref 0.0–0.5)
Eosinophils Relative: 1 %
HCT: 39.2 % (ref 36.0–46.0)
Hemoglobin: 12.8 g/dL (ref 12.0–15.0)
Immature Granulocytes: 1 %
Lymphocytes Relative: 14 %
Lymphs Abs: 1.6 10*3/uL (ref 0.7–4.0)
MCH: 31.4 pg (ref 26.0–34.0)
MCHC: 32.7 g/dL (ref 30.0–36.0)
MCV: 96.1 fL (ref 80.0–100.0)
Monocytes Absolute: 1 10*3/uL (ref 0.1–1.0)
Monocytes Relative: 8 %
Neutro Abs: 8.7 10*3/uL — ABNORMAL HIGH (ref 1.7–7.7)
Neutrophils Relative %: 76 %
Platelets: 346 10*3/uL (ref 150–400)
RBC: 4.08 MIL/uL (ref 3.87–5.11)
RDW: 14 % (ref 11.5–15.5)
WBC: 11.4 10*3/uL — ABNORMAL HIGH (ref 4.0–10.5)
nRBC: 0 % (ref 0.0–0.2)

## 2021-06-07 LAB — BASIC METABOLIC PANEL
Anion gap: 14 (ref 5–15)
BUN: 8 mg/dL (ref 8–23)
CO2: 20 mmol/L — ABNORMAL LOW (ref 22–32)
Calcium: 9.3 mg/dL (ref 8.9–10.3)
Chloride: 108 mmol/L (ref 98–111)
Creatinine, Ser: 0.84 mg/dL (ref 0.44–1.00)
GFR, Estimated: >60 mL/min (ref >60)
Glucose, Bld: 98 mg/dL (ref 70–99)
Potassium: 3.7 mmol/L (ref 3.5–5.1)
Sodium: 142 mmol/L (ref 135–145)

## 2021-06-07 LAB — RAPID URINE DRUG SCREEN, HOSP PERFORMED
Amphetamines: NOT DETECTED
Barbiturates: NOT DETECTED
Benzodiazepines: NOT DETECTED
Cocaine: NOT DETECTED
Opiates: NOT DETECTED
Tetrahydrocannabinol: NOT DETECTED

## 2021-06-07 LAB — HEPATITIS PANEL, ACUTE
HCV Ab: NONREACTIVE
Hep A IgM: NONREACTIVE
Hep B C IgM: NONREACTIVE
Hepatitis B Surface Ag: NONREACTIVE

## 2021-06-07 LAB — CK: Total CK: 855 U/L — ABNORMAL HIGH (ref 38–234)

## 2021-06-07 LAB — BRAIN NATRIURETIC PEPTIDE: B Natriuretic Peptide: 86.8 pg/mL (ref 0.0–100.0)

## 2021-06-07 LAB — TSH: TSH: 1.137 u[IU]/mL (ref 0.350–4.500)

## 2021-06-07 LAB — RESP PANEL BY RT-PCR (FLU A&B, COVID) ARPGX2
Influenza A by PCR: NEGATIVE
Influenza B by PCR: NEGATIVE
SARS Coronavirus 2 by RT PCR: NEGATIVE

## 2021-06-07 LAB — MAGNESIUM: Magnesium: 2 mg/dL (ref 1.7–2.4)

## 2021-06-07 LAB — LACTIC ACID, PLASMA: Lactic Acid, Venous: 1.1 mmol/L (ref 0.5–1.9)

## 2021-06-07 IMAGING — MR MR HEAD W/O CM
15 of 16 series · 44 of 48 positions shown · non-contrast
Comparison: Head CT from yesterday

CLINICAL DATA: Altered mental status

EXAM:
MRI HEAD WITHOUT CONTRAST
TECHNIQUE: Multiplanar, multiecho pulse sequences of the brain and surrounding
structures were obtained without intravenous contrast.

[Series 6: DWI · axial · 3.0mm · 0.88mm/px · z∈[-113,+38]mm · 6 of 106 slices shown (1 of 6)]
[im 1/106]
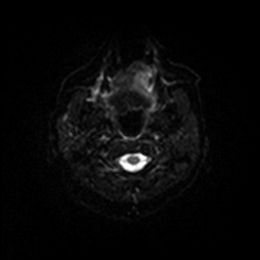
[im 22/106]
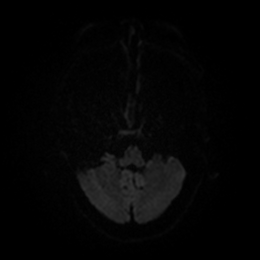
[im 43/106]
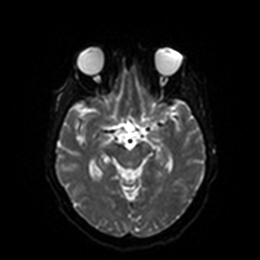
[im 64/106]
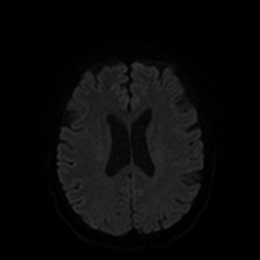
[im 85/106]
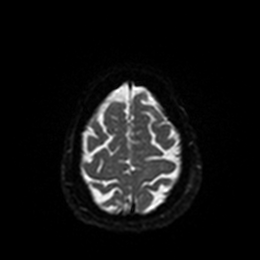
[im 106/106]
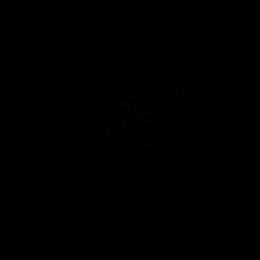

[Series 7: DWI · axial · 3.0mm · 0.88mm/px · z∈[-113,+35]mm · 2 of 52 slices shown (2 of 6)]
[im 1/52]
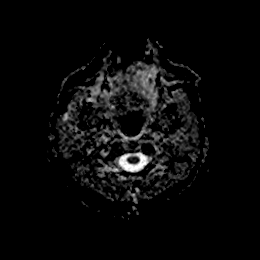
[im 52/52]
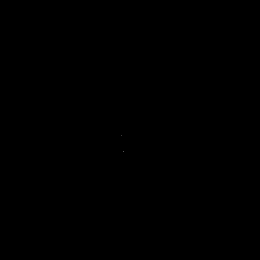

[Series 8: DWI · coronal · 4.0mm · 0.88mm/px · 5 of 76 slices shown (3 of 6)]
[im 1/76]
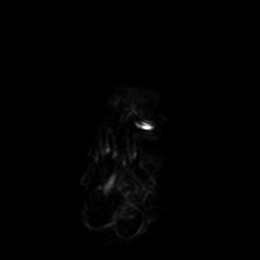
[im 19/76]
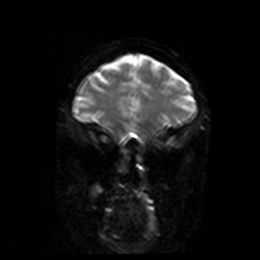
[im 38/76]
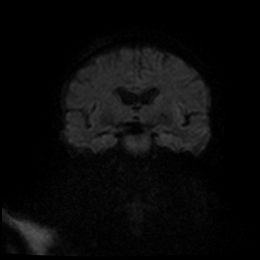
[im 57/76]
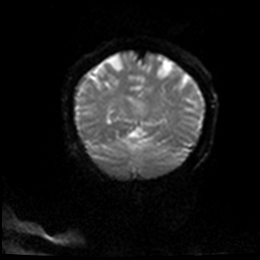
[im 76/76]
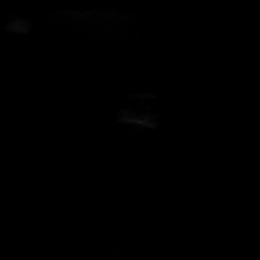

[Series 9: DWI · coronal · 4.0mm · 0.88mm/px · 2 of 38 slices shown (4 of 6)]
[im 1/38]
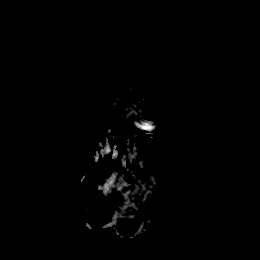
[im 38/38]
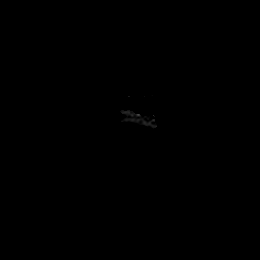

[Series 10: FLAIR · axial · 5.0mm · 0.45mm/px · z∈[-107,+38]mm · 2 of 26 slices shown (1 of 2)]
[im 1/26]
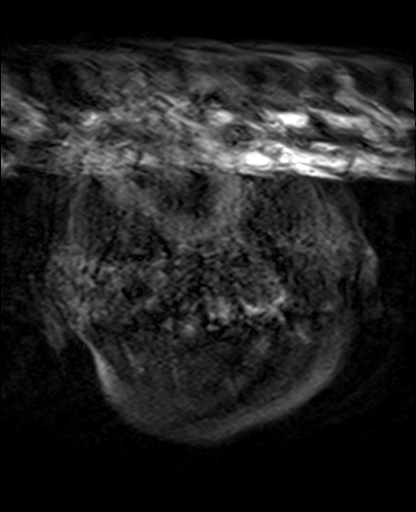
[im 26/26]
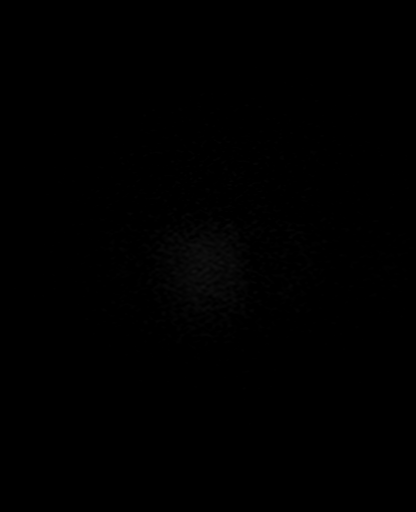

[Series 11: mag_images · axial · 3.0mm · 0.90mm/px · z∈[-109,+39]mm · 3 of 52 slices shown]
[im 1/52]
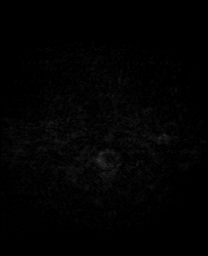
[im 26/52]
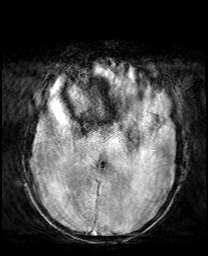
[im 52/52]
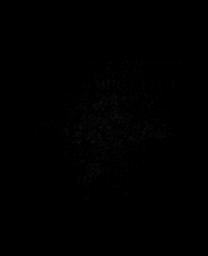

[Series 12: pha_images · axial · 3.0mm · 0.90mm/px · z∈[-94,+25]mm · 3 of 42 slices shown]
[im 1/42]
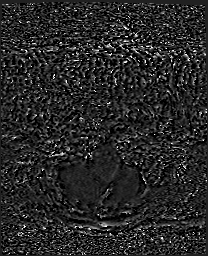
[im 21/42]
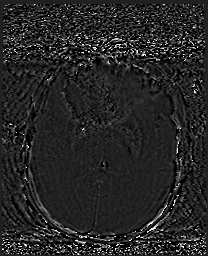
[im 42/42]
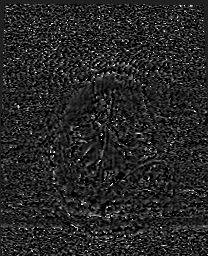

[Series 13: swi_images · axial · 3.0mm · 0.90mm/px · z∈[-109,+39]mm · 3 of 52 slices shown]
[im 1/52]
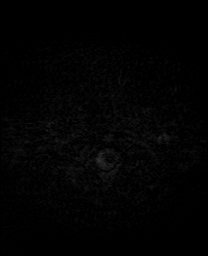
[im 26/52]
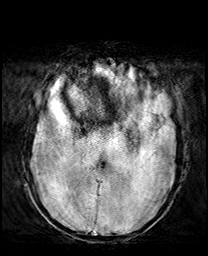
[im 52/52]
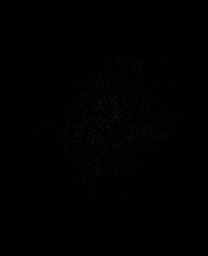

[Series 14: mip_images(sw) · axial · 24.0mm · 0.90mm/px · z∈[-99,+29]mm · 3 of 45 slices shown]
[im 1/45]
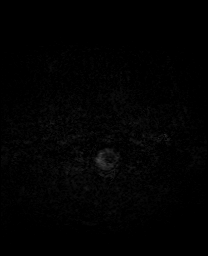
[im 23/45]
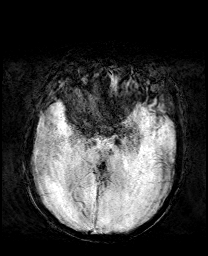
[im 45/45]
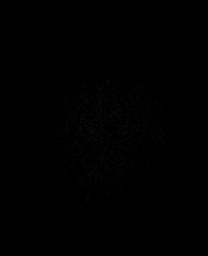

[Series 15: FLAIR · axial · 5.0mm · 0.45mm/px · z∈[-113,+44]mm · 2 of 28 slices shown (2 of 2)]
[im 1/28]
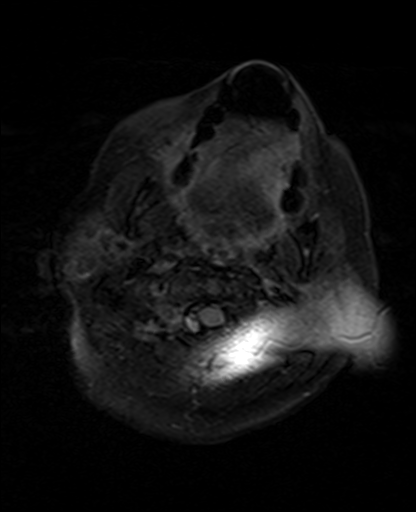
[im 28/28]
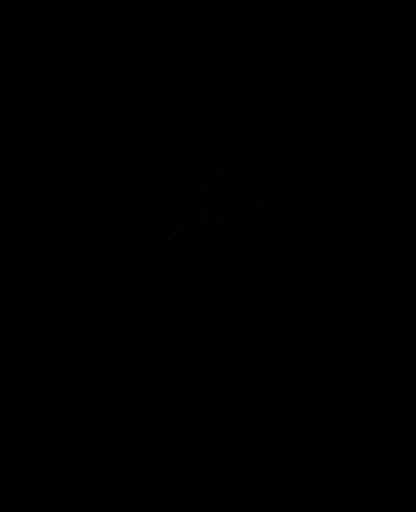

[Series 16: T1 · sagittal · 5.0mm · 0.75mm/px · 2 of 27 slices shown]
[im 1/27]
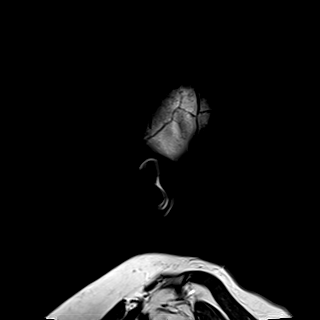
[im 27/27]
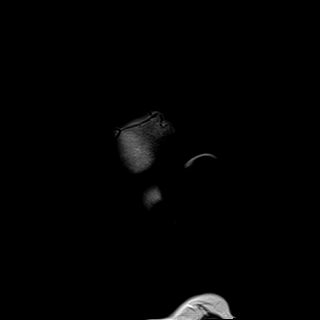

[Series 17: T2 · axial · 5.0mm · 0.72mm/px · z∈[-123,+34]mm · 2 of 28 slices shown (1 of 2)]
[im 1/28]
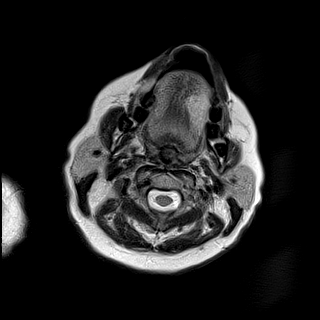
[im 28/28]
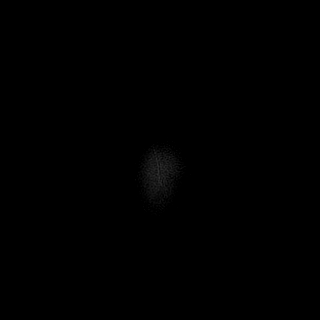

[Series 19: T2 · coronal · 5.0mm · 0.34mm/px · 2 of 34 slices shown (2 of 2)]
[im 1/34]
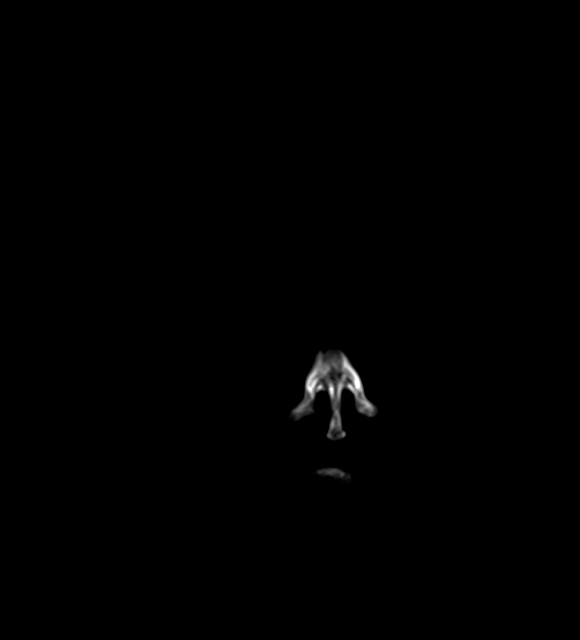
[im 34/34]
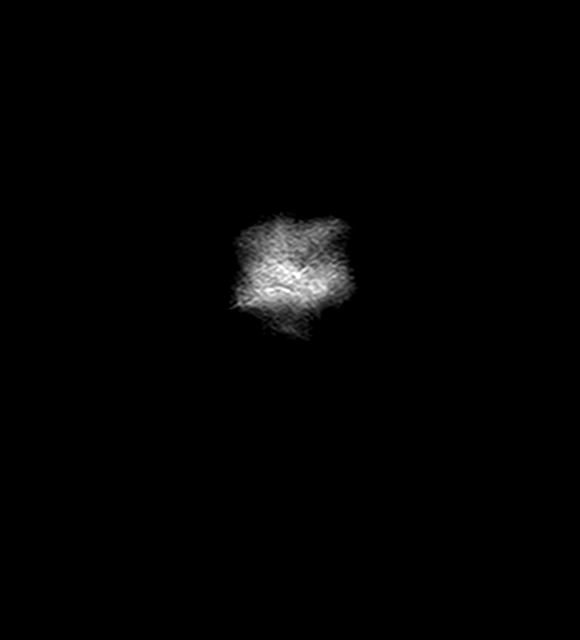

[Series 20: DWI · coronal · 4.0mm · 0.88mm/px · 5 of 76 slices shown (5 of 6)]
[im 1/76]
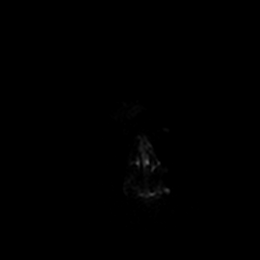
[im 19/76]
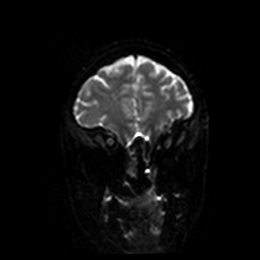
[im 38/76]
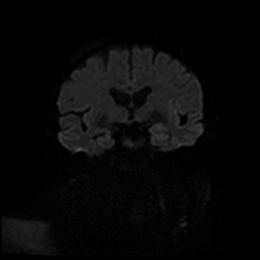
[im 57/76]
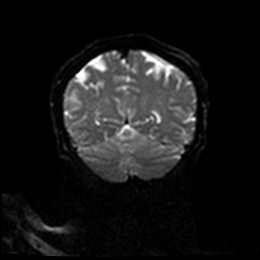
[im 76/76]
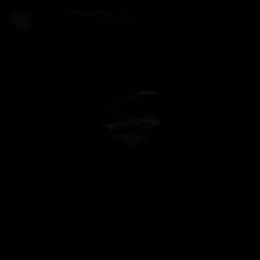

[Series 21: DWI · coronal · 4.0mm · 0.88mm/px · 2 of 38 slices shown (6 of 6)]
[im 1/38]
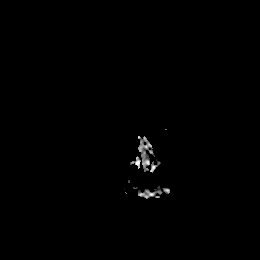
[im 38/38]
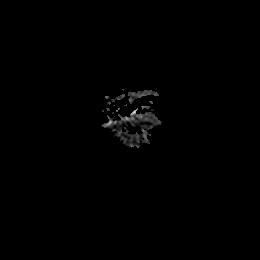

[44 of 48 positions shown; findings below may reference images not displayed]

FINDINGS: Brain: No acute infarction, hemorrhage, hydrocephalus, extra-axial
collection or mass effect. 7 mm mass along the inferior margin of
the mid falx. Age normal brain volume and white matter appearance

Vascular: Preserved flow voids

Skull and upper cervical spine: No focal marrow lesion. Cervical
spine degeneration with anterolisthesis at C3-4.

Sinuses/Orbits: Negative
IMPRESSION: 1. No acute finding.
2. 7 mm presumed falcine meningioma.
3. Intermittent motion artifact.

## 2021-06-07 IMAGING — CR DG LUMBAR SPINE COMPLETE 4+V
4 series · 4 of 4 positions shown · non-contrast
Comparison: None.

CLINICAL DATA: Lower back pain.

EXAM:
LUMBAR SPINE - COMPLETE 4+ VIEW

[l-spine ap]
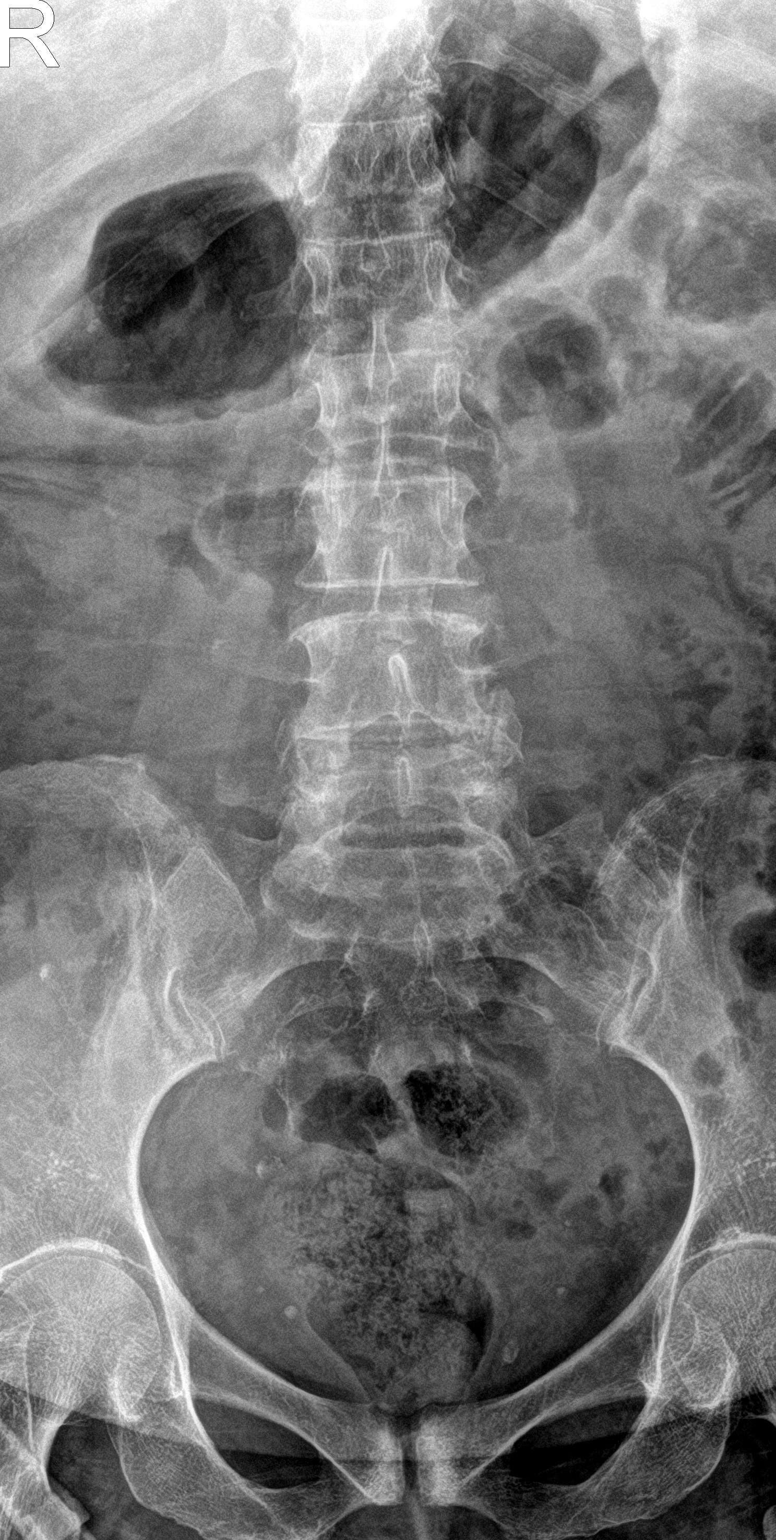

[l-spine obl (1 of 2)]
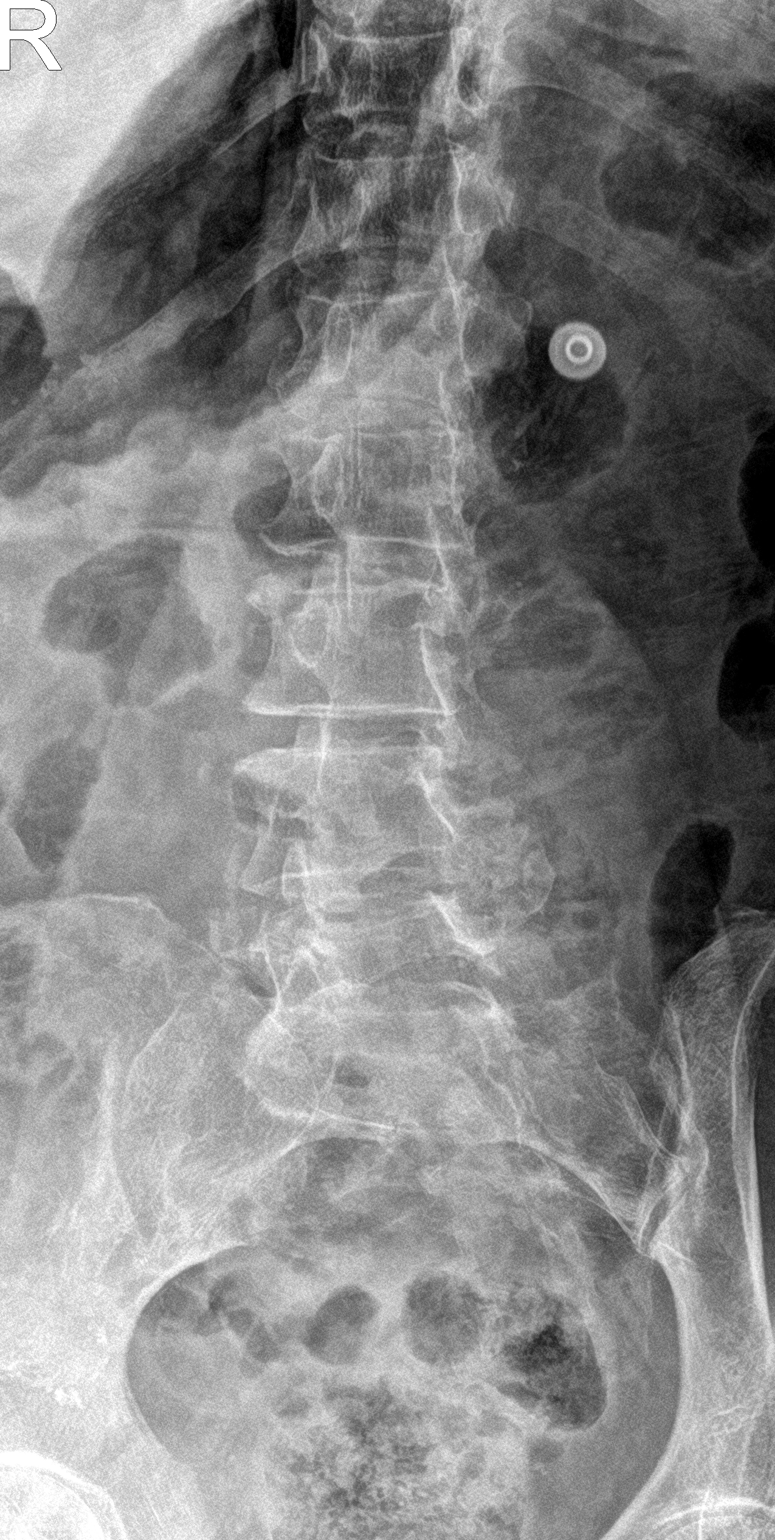

[l-spine obl (2 of 2)]
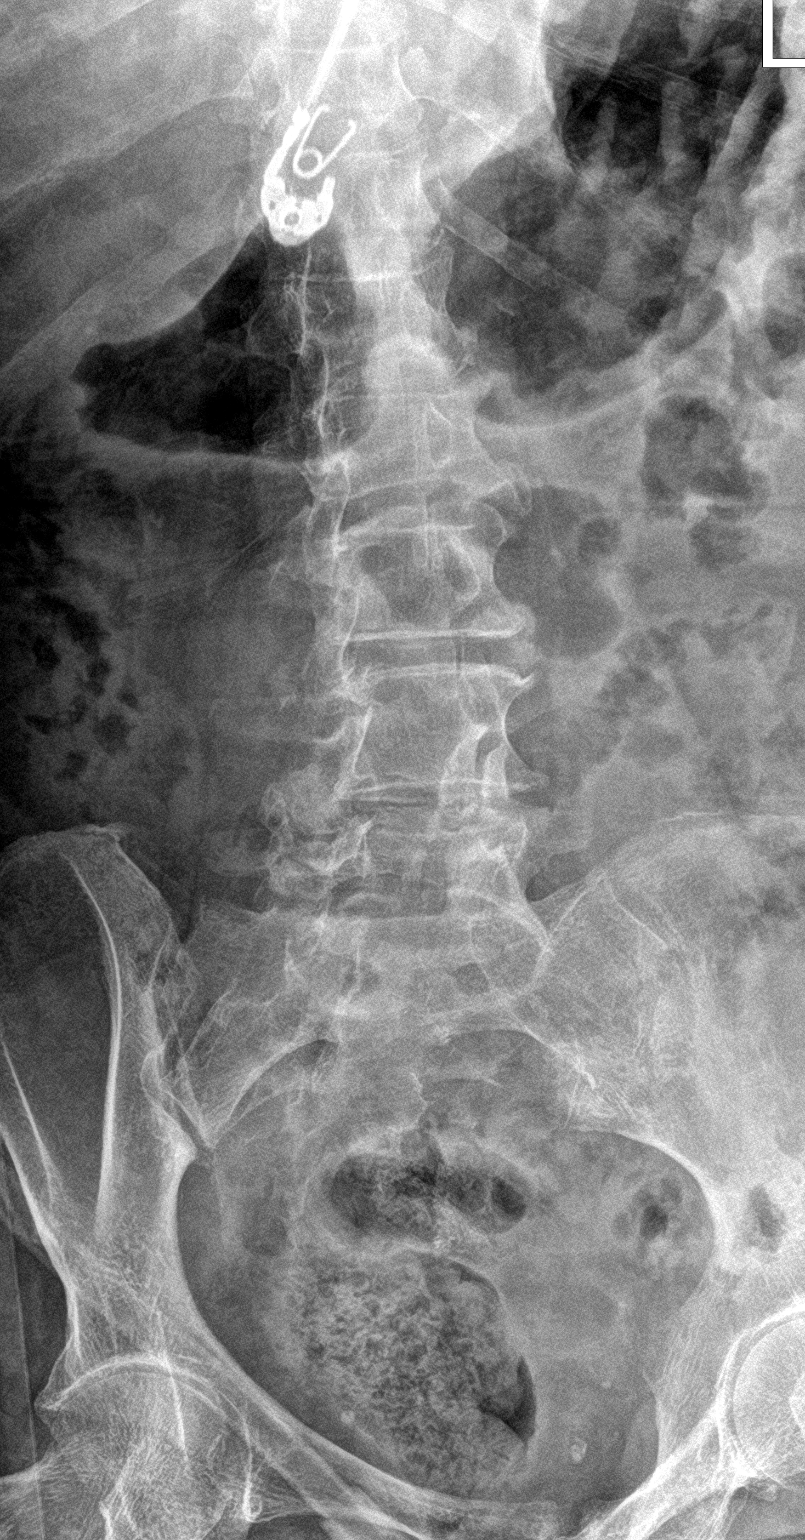

[l-spine lat]
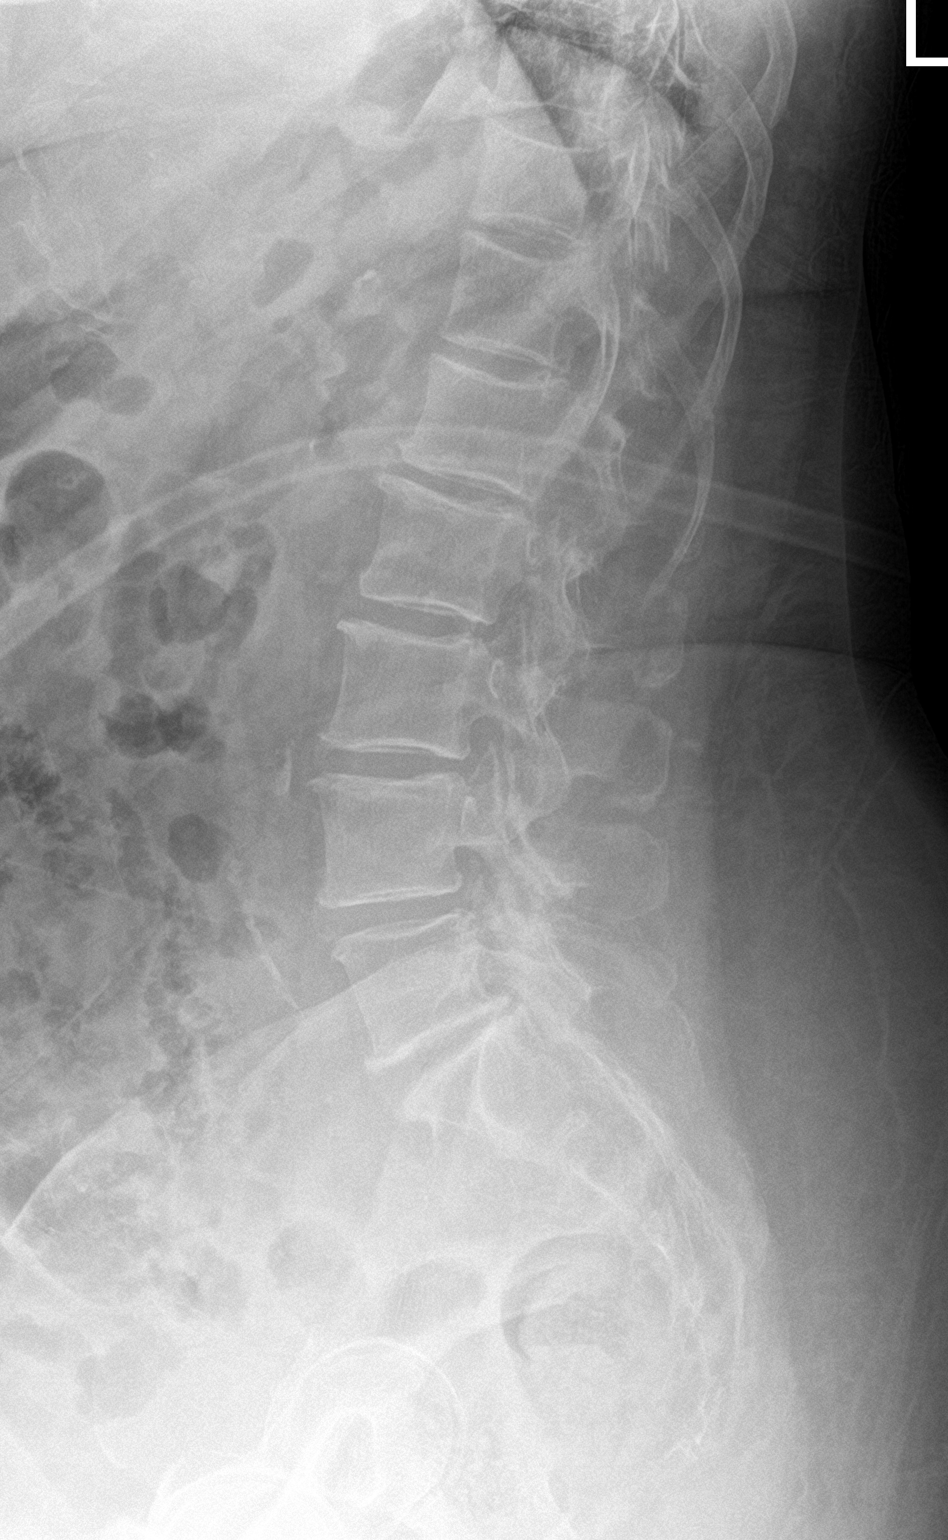

[4 of 4 positions shown; findings below may reference images not displayed]

FINDINGS: There is no evidence of lumbar spine fracture. Alignment is normal.
Mild degenerative disc disease is noted at L1-2, L2-3, L3-4 and
L5-S1.
IMPRESSION: Multilevel degenerative disc disease.  No acute abnormality seen.

## 2021-06-07 MED ORDER — ACETAMINOPHEN 650 MG RE SUPP: 650.0000 mg | RECTAL | Status: DC | PRN

## 2021-06-07 MED ORDER — LEVETIRACETAM 250 MG PO TABS
250.0000 mg | ORAL_TABLET | Freq: Once | ORAL | Status: AC
  Administered 2021-06-07: 250 mg via ORAL
  Filled 2021-06-07 (×2): qty 1

## 2021-06-07 MED ORDER — ONDANSETRON HCL 4 MG PO TABS: 4.0000 mg | ORAL_TABLET | Freq: Four times a day (QID) | ORAL | Status: DC | PRN

## 2021-06-07 MED ORDER — CHLORHEXIDINE GLUCONATE 0.12% ORAL RINSE (MEDLINE KIT)
15.0000 mL | Freq: Two times a day (BID) | OROMUCOSAL | Status: DC
  Administered 2021-06-08 – 2021-06-09 (×4): 15 mL via OROMUCOSAL

## 2021-06-07 MED ORDER — LORAZEPAM 2 MG/ML IJ SOLN
1.0000 mg | INTRAMUSCULAR | Status: DC | PRN
Start: 2021-06-07 — End: 2021-06-09

## 2021-06-07 MED ORDER — HYDROCODONE-ACETAMINOPHEN 5-325 MG PO TABS
1.0000 | ORAL_TABLET | Freq: Four times a day (QID) | ORAL | Status: DC | PRN
  Administered 2021-06-07 – 2021-06-09 (×3): 1 via ORAL
  Filled 2021-06-07 (×3): qty 1

## 2021-06-07 MED ORDER — LORAZEPAM 2 MG/ML IJ SOLN: 0.5000 mg | Freq: Once | INTRAMUSCULAR | Status: DC | PRN

## 2021-06-07 MED ORDER — LEVETIRACETAM 750 MG PO TABS
1250.0000 mg | ORAL_TABLET | Freq: Two times a day (BID) | ORAL | Status: DC
  Administered 2021-06-08 – 2021-06-09 (×3): 1250 mg via ORAL
  Filled 2021-06-07 (×3): qty 1

## 2021-06-07 MED ORDER — ENOXAPARIN SODIUM 40 MG/0.4ML IJ SOSY
40.0000 mg | PREFILLED_SYRINGE | Freq: Every day | INTRAMUSCULAR | Status: DC
  Administered 2021-06-07 – 2021-06-09 (×3): 40 mg via SUBCUTANEOUS
  Filled 2021-06-07 (×3): qty 0.4

## 2021-06-07 MED ORDER — ORAL CARE MOUTH RINSE
15.0000 mL | OROMUCOSAL | Status: DC
  Administered 2021-06-07 – 2021-06-09 (×10): 15 mL via OROMUCOSAL

## 2021-06-07 MED ORDER — LORAZEPAM 2 MG/ML IJ SOLN: 1.0000 mg | Freq: Once | INTRAMUSCULAR | Status: DC | PRN

## 2021-06-07 MED ORDER — PREDNISONE 5 MG PO TABS
5.0000 mg | ORAL_TABLET | Freq: Every day | ORAL | Status: DC
  Administered 2021-06-08 – 2021-06-09 (×2): 5 mg via ORAL
  Filled 2021-06-07 (×3): qty 1

## 2021-06-07 MED ORDER — MAGNESIUM OXIDE -MG SUPPLEMENT 400 (240 MG) MG PO TABS
400.0000 mg | ORAL_TABLET | Freq: Every day | ORAL | Status: DC
  Administered 2021-06-07 – 2021-06-09 (×3): 400 mg via ORAL
  Filled 2021-06-07 (×3): qty 1

## 2021-06-07 MED ORDER — SODIUM CHLORIDE 0.9 % IV SOLN
75.0000 mL/h | INTRAVENOUS | Status: DC
  Administered 2021-06-07: 75 mL/h via INTRAVENOUS

## 2021-06-07 MED ORDER — ALBUTEROL SULFATE (2.5 MG/3ML) 0.083% IN NEBU: 2.5000 mg | INHALATION_SOLUTION | Freq: Four times a day (QID) | RESPIRATORY_TRACT | Status: DC | PRN

## 2021-06-07 MED ORDER — LEVETIRACETAM ER 500 MG PO TB24
1000.0000 mg | ORAL_TABLET | Freq: Every day | ORAL | Status: DC
  Administered 2021-06-07: 1000 mg via ORAL
  Filled 2021-06-07: qty 2

## 2021-06-07 MED ORDER — MAGIC MOUTHWASH W/LIDOCAINE
2.0000 mL | Freq: Three times a day (TID) | ORAL | Status: DC
  Administered 2021-06-07 – 2021-06-09 (×7): 2 mL via ORAL
  Filled 2021-06-07 (×9): qty 5

## 2021-06-07 MED ORDER — LIDOCAINE 5 % EX PTCH
1.0000 | MEDICATED_PATCH | CUTANEOUS | Status: DC
  Administered 2021-06-07 – 2021-06-09 (×3): 1 via TRANSDERMAL
  Filled 2021-06-07 (×3): qty 1

## 2021-06-07 MED ORDER — AMLODIPINE BESYLATE 10 MG PO TABS
10.0000 mg | ORAL_TABLET | Freq: Every day | ORAL | Status: DC
  Administered 2021-06-07 – 2021-06-09 (×3): 10 mg via ORAL
  Filled 2021-06-07 (×2): qty 2
  Filled 2021-06-07 (×2): qty 1

## 2021-06-07 MED ORDER — ONDANSETRON HCL 4 MG/2ML IJ SOLN: 4.0000 mg | Freq: Four times a day (QID) | INTRAMUSCULAR | Status: DC | PRN

## 2021-06-07 MED ORDER — LEVOTHYROXINE SODIUM 50 MCG PO TABS
50.0000 ug | ORAL_TABLET | Freq: Every day | ORAL | Status: DC
Start: 2021-06-08 — End: 2021-06-09
  Administered 2021-06-08 – 2021-06-09 (×2): 50 ug via ORAL
  Filled 2021-06-07 (×2): qty 1

## 2021-06-07 MED ORDER — FUROSEMIDE 10 MG/ML IJ SOLN: 40.0000 mg | Freq: Two times a day (BID) | INTRAMUSCULAR | Status: DC

## 2021-06-07 MED ORDER — IRBESARTAN 300 MG PO TABS
300.0000 mg | ORAL_TABLET | Freq: Every day | ORAL | Status: DC
  Administered 2021-06-07 – 2021-06-09 (×3): 300 mg via ORAL
  Filled 2021-06-07 (×3): qty 1

## 2021-06-07 MED ORDER — LEVETIRACETAM 750 MG PO TABS
1250.0000 mg | ORAL_TABLET | Freq: Two times a day (BID) | ORAL | Status: DC
  Filled 2021-06-07 (×2): qty 1

## 2021-06-07 MED ORDER — FUROSEMIDE 10 MG/ML IJ SOLN
40.0000 mg | Freq: Once | INTRAMUSCULAR | Status: AC
  Administered 2021-06-07: 40 mg via INTRAVENOUS
  Filled 2021-06-07: qty 4

## 2021-06-07 MED ORDER — METOPROLOL SUCCINATE ER 50 MG PO TB24
50.0000 mg | ORAL_TABLET | Freq: Every day | ORAL | Status: DC
  Administered 2021-06-07 – 2021-06-09 (×3): 50 mg via ORAL
  Filled 2021-06-07: qty 2
  Filled 2021-06-07 (×2): qty 1

## 2021-06-07 MED ORDER — PANTOPRAZOLE SODIUM 40 MG PO TBEC
40.0000 mg | DELAYED_RELEASE_TABLET | Freq: Every day | ORAL | Status: DC
  Administered 2021-06-08 – 2021-06-09 (×2): 40 mg via ORAL
  Filled 2021-06-07 (×2): qty 1

## 2021-06-07 MED ORDER — ALLOPURINOL 300 MG PO TABS
300.0000 mg | ORAL_TABLET | Freq: Every day | ORAL | Status: DC
  Administered 2021-06-07 – 2021-06-09 (×3): 300 mg via ORAL
  Filled 2021-06-07: qty 1
  Filled 2021-06-07: qty 3
  Filled 2021-06-07: qty 1

## 2021-06-07 MED ORDER — POTASSIUM CHLORIDE 10 MEQ/100ML IV SOLN
10.0000 meq | INTRAVENOUS | Status: AC
  Administered 2021-06-07 (×2): 10 meq via INTRAVENOUS
  Filled 2021-06-07 (×3): qty 100

## 2021-06-07 MED ORDER — ACETAMINOPHEN 325 MG PO TABS: 650.0000 mg | ORAL_TABLET | ORAL | Status: DC | PRN

## 2021-06-07 NOTE — ED Notes (Signed)
Patient transported to X-ray 

## 2021-06-07 NOTE — ED Notes (Signed)
Added BNP to lab

## 2021-06-07 NOTE — ED Notes (Signed)
Meal given at the bedside

## 2021-06-07 NOTE — Evaluation (Signed)
Physical Therapy Evaluation Patient Details Name: Kathy Scott MRN: [id number] DOB: 11-11-1950 Today's Date: 06/07/2021  History of Present Illness  Pt is a 70 y/o female admitted 11/21 secondary to AMS and breakthrough seizures. Imaging showed mild compression deformity in thoracic spine.  PMH includes seizures, colon cancer, and HTN.  Clinical Impression  Pt admitted secondary to problem above with deficits below. Pt requiring mod-max A +2 to perform bed mobility tasks. Upon sitting, pt with DOE at 3/4 and unable to calm breathing so further mobility deferred. Sats ranging from 89-91% on RA, HR in mid 120s and BP went up to 207/94 in sitting. Upon return to supine BP at 178/83. Given current deficits, feel pt would benefit from SNF level therapies, however, should pt progress well, may be able to d/c home with HHPT and assist from family. Will continue to follow acutely.      Recommendations for follow up therapy are one component of a multi-disciplinary discharge planning process, led by the attending physician.  Recommendations may be updated based on patient status, additional functional criteria and insurance authorization.  Follow Up Recommendations Skilled nursing-short term rehab (<3 hours/day) (vs HHPT if pt progresses well)    Assistance Recommended at Discharge Frequent or constant Supervision/Assistance  Functional Status Assessment Patient has had a recent decline in their functional status and demonstrates the ability to make significant improvements in function in a reasonable and predictable amount of time.  Equipment Recommendations  Wheelchair (measurements PT);Wheelchair cushion (measurements PT)    Recommendations for Other Services       Precautions / Restrictions Precautions Precautions: Fall;Back Restrictions Weight Bearing Restrictions: No      Mobility  Bed Mobility Overal bed mobility: Needs Assistance Bed Mobility: Rolling;Sidelying to Sit;Sit to  Sidelying Rolling: Mod assist;+2 for physical assistance Sidelying to sit: Mod assist;+2 for physical assistance     Sit to sidelying: +2 for physical assistance;Max assist General bed mobility comments: Mod A +2 for trunk and LE assist to come to sitting. Pt with increased SOB and had difficulty catching breath with seated rest. oxygen sats from 89-91% on RA and HR in mid 120s. BP in sitting at 207/94. Max A +2 to return to supine. BP in supine at 178/83    Transfers                   General transfer comment: Deferred    Ambulation/Gait                  Stairs            Wheelchair Mobility    Modified Rankin (Stroke Patients Only)       Balance Overall balance assessment: Needs assistance Sitting-balance support: Bilateral upper extremity supported Sitting balance-Leahy Scale: Poor Sitting balance - Comments: Reliant on BUE support                                     Pertinent Vitals/Pain Pain Assessment: Faces Faces Pain Scale: Hurts whole lot Pain Location: back Pain Descriptors / Indicators: Moaning;Grimacing;Guarding Pain Intervention(s): Limited activity within patient's tolerance;Monitored during session;Repositioned    Home Living Family/patient expects to be discharged to:: Private residence Living Arrangements: Alone Available Help at Discharge: Friend(s) Type of Home: House Home Access: Stairs to enter Entrance Stairs-Rails: None Entrance Stairs-Number of Steps: 1   Home Layout: One level Home Equipment: Grab bars - tub/shower;Rolling Environmental consultant (2  wheels);Cane - single point;BSC/3in1      Prior Function Prior Level of Function : Independent/Modified Independent                     Hand Dominance        Extremity/Trunk Assessment   Upper Extremity Assessment Upper Extremity Assessment: Defer to OT evaluation    Lower Extremity Assessment Lower Extremity Assessment: Generalized weakness (reports  bilateral thigh pain with movement)    Cervical / Trunk Assessment Cervical / Trunk Assessment: Other exceptions Cervical / Trunk Exceptions: Thoracic compression deformity per imaging  Communication   Communication: No difficulties  Cognition Arousal/Alertness: Awake/alert Behavior During Therapy: WFL for tasks assessed/performed Overall Cognitive Status: No family/caregiver present to determine baseline cognitive functioning                                 General Comments: Pt did not know what day of the week it was, even after being reoriented. Also could not say what holiday was coming up.        General Comments General comments (skin integrity, edema, etc.): No family present    Exercises     Assessment/Plan    PT Assessment Patient needs continued PT services  PT Problem List Decreased strength;Decreased activity tolerance;Decreased balance;Decreased mobility;Decreased knowledge of use of DME;Decreased knowledge of precautions;Decreased safety awareness;Pain       PT Treatment Interventions DME instruction;Gait training;Therapeutic activities;Functional mobility training;Balance training;Therapeutic exercise;Patient/family education    PT Goals (Current goals can be found in the Care Plan section)  Acute Rehab PT Goals Patient Stated Goal: to go home PT Goal Formulation: With patient Time For Goal Achievement: 06/21/21 Potential to Achieve Goals: Fair    Frequency Min 3X/week   Barriers to discharge Decreased caregiver support      Co-evaluation PT/OT/SLP Co-Evaluation/Treatment: Yes Reason for Co-Treatment: To address functional/ADL transfers;For patient/therapist safety PT goals addressed during session: Mobility/safety with mobility;Balance         AM-PAC PT "6 Clicks" Mobility  Outcome Measure Help needed turning from your back to your side while in a flat bed without using bedrails?: Total Help needed moving from lying on your back to  sitting on the side of a flat bed without using bedrails?: Total Help needed moving to and from a bed to a chair (including a wheelchair)?: Total Help needed standing up from a chair using your arms (e.g., wheelchair or bedside chair)?: Total Help needed to walk in hospital room?: Total Help needed climbing 3-5 steps with a railing? : Total 6 Click Score: 6    End of Session   Activity Tolerance: Patient limited by pain;Treatment limited secondary to medical complications (Comment) (DOE) Patient left: in bed;with call bell/phone within reach (on stretcher in ED) Nurse Communication: Mobility status;Other (comment) (DOE at 3/4) PT Visit Diagnosis: Other abnormalities of gait and mobility (R26.89);Unsteadiness on feet (R26.81);Difficulty in walking, not elsewhere classified (R26.2);Pain Pain - part of body:  (back)    Time: 4196-2229 PT Time Calculation (min) (ACUTE ONLY): 27 min   Charges:   PT Evaluation $PT Eval Moderate Complexity: 1 Mod          Reuel Derby, PT, DPT  Acute Rehabilitation Services  Pager: [phone] Office: [phone]   Rudean Hitt 06/07/2021, 2:28 PM

## 2021-06-07 NOTE — Progress Notes (Signed)
PT Cancellation Note  Patient Details Name: Kathy Scott MRN: [id number] DOB: 02/05/1951   Cancelled Treatment:    Reason Eval/Treat Not Completed: Patient at procedure or test/unavailable Pt currently at Xray. Will follow up as schedule allows.   Lou Miner, DPT  Acute Rehabilitation Services  Pager: [phone] Office: [phone]    Rudean Hitt 06/07/2021, 12:17 PM

## 2021-06-07 NOTE — ED Notes (Signed)
Patient transported to MRI 

## 2021-06-07 NOTE — ED Notes (Signed)
Pt back from MRI at this time. Medications restarted and pt resting without difficulty. Will continue to monitor.

## 2021-06-07 NOTE — Progress Notes (Addendum)
Patient had actually received extended release Keppra while in the ED of 1000 mg this morning.  Neurology had recommended placing on Keppra 1250 mg twice daily.  Pharmacy to give one-time dose of 1250 mg extended release and then start Keppra 250 mg twice daily on 11/23.  Patient was noted to be still altered after being evaluated by physical therapy.  They were unable to get her up due to increased work of breathing although denied any complaints of shortness of breath.  Imaging of the thoracic spine did note at least 2 age-indeterminate compression fractures.  History does also include osteoporosis diagnosed by prior density scans.  Orders placed to check MRI of the thoracic spine to further evaluate compression fracture.  Continue PT, lidocaine patch, and hydrocodone as needed for pain.  May warrant consult to neurosurgery if unable to get patient's pain under control ambulate.

## 2021-06-07 NOTE — ED Notes (Signed)
Pt stated she wants to go home. She understands what a seizure is because she has been dealing with them for a while. She feels she can contact her PCP to better manage her medication. Notified MD.

## 2021-06-07 NOTE — ED Notes (Signed)
Provider at the bedside speaking with brother with pt.

## 2021-06-07 NOTE — ED Notes (Signed)
Clean patient   place chux under her place back on montior

## 2021-06-07 NOTE — ED Notes (Addendum)
Called MRI to get update on time frame, states it will be awhile. MD notified. Pt resting on stretcher with eyes closed, respirations even and unlabored. Pt continues to be confused. Purewick in place, cardiac monitor in place, lights dimmed, side rails up x2 and padded, call bell within reach. No acute changes noted. Will continue to monitor.

## 2021-06-07 NOTE — ED Notes (Signed)
Called MRI and questioned timeframe of scan. They are coming to get her next if she is properly sedated and not tearing apart the scanner. MRI informed that pt is not oriented and will continue to be and that is the reasoning behind the MRI being ordered. Also states they will bring her back if she is uncooperative. MD notified.

## 2021-06-07 NOTE — Progress Notes (Signed)
Occupational Therapy Evaluation  PTA pt lives alone, drives and is active with volunteering. Pt primarily limited this session by back pain and DOE. Required Mod A +2 to progress to EOB, Mod A with LB ADL and Max A +2 to recline to supine. 3/4 DOE with BP 208/94 in sitting; SpO2 90 and HR in the 130s. Pt does not appear to be at her baseline cognitively as noted below - will further assess. Given her difficulty with mobility and ADL, at this time recommend DC to SNF for rehab. It pt progresses, she will need to have assistance at all times with mobility and ADL to DC home. Will follow acutely.     06/07/21 1500  OT Visit Information  Last OT Received On 06/07/21  Assistance Needed +2  PT/OT/SLP Co-Evaluation/Treatment Yes  Reason for Co-Treatment For patient/therapist safety;To address functional/ADL transfers  OT goals addressed during session ADL's and self-care  History of Present Illness Pt is a 70 y/o female admitted 11/21 secondary to AMS and breakthrough seizures. Imaging showed mild compression deformity in thoracic spine.  PMH includes seizures, colon cancer, and HTN.  Precautions  Precautions Fall;Back  Precaution Booklet Issued No  Precaution Comments RR/SOB; BP  Home Living  Family/patient expects to be discharged to: Private residence  Living Arrangements Alone  Available Help at Discharge Friend(s)  Type of [place] to enter  Entrance Stairs-Number of Steps 1  Entrance Stairs-Rails None  Home Layout One level  Bathroom Shower/Tub Walk-in shower;Tub/shower unit;Door  Corporate treasurer No  Home Equipment Grab bars - tub/shower;Rolling Environmental consultant (2 wheels);Cane - single point;BSC/3in1  Prior Function  Prior Level of Function  Independent/Modified Independent;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 thought Thanksgiving was next week)  Current Attention Level Selective  Memory Decreased recall of precautions;Decreased short-term memory  Safety/Judgement Decreased awareness of safety;Decreased awareness of deficits  Awareness Emergent  Problem Solving Slow processing  General Comments Repeated herself at times; decreased problem solving when trying to reorient; Will further assess  Upper Extremity Assessment  Upper Extremity Assessment Overall WFL for tasks assessed (pain with movement most likely related to thoracic fractures)  Lower Extremity Assessment  Lower Extremity Assessment Defer to PT evaluation  Cervical / Trunk Assessment  Cervical / Trunk Assessment Other exceptions  Cervical / Trunk Exceptions Thoracic compression deformity per imaging  Vision- History  Baseline Vision/History 1 Wears glasses  Vision- Assessment  Additional Comments no apparent deficits; will further assess  ADL  Overall ADL's  Needs assistance/impaired  Eating/Feeding Modified independent  Grooming Set up  Lower Body Bathing Moderate assistance;Sit to/from stand  Upper Body Dressing  Set up;Sitting  Lower Body Dressing Moderate assistance  Toilet Transfer  (unable to attempt)  Toileting- Clothing Manipulation and Hygiene Moderate assistance  Toileting - Clothing Manipulation Details (indicate cue type and reason) unable to reach periarea due to back pain  Functional mobility during ADLs Moderate assistance;+2 for  physical assistance (unable to stand this date)  Bed Mobility  Overal bed mobility Needs Assistance  Bed Mobility Rolling;Sidelying to Sit;Sit to Sidelying  Rolling Mod assist;+2 for physical assistance  Sidelying to sit Mod assist;+2 for physical assistance  Sit to sidelying +2 for physical assistance;Max assist  General bed mobility comments Mod A +2 for trunk and LE assist to come to sitting. Pt with increased SOB and had difficulty catching breath with seated rest. oxygen sats from 89-91% on RA and HR in mid 120s. BP in sitting at 207/94. Max A +2 to return to supine. BP in supine at 178/83  Transfers  General transfer comment Deferred due to DOE 3/4 with bed mobility adn systolic BP 859  Balance  Overall balance assessment Needs assistance  Sitting-balance support Bilateral upper extremity supported  Sitting balance-Leahy Scale Poor  Sitting balance - Comments Reliant on BUE support  Exercises  Exercises Other exercises  Other Exercises  Other Exercises pursed lip breathing  OT - End of Session  Activity Tolerance Patient limited by pain;Other (comment) (limited by DOE)  Patient left in bed;with call bell/phone within reach  Nurse Communication Mobility status;Other (comment) (DC needs)  OT Assessment  OT Recommendation/Assessment Patient needs continued OT Services  OT Visit Diagnosis Unsteadiness on feet (R26.81);Other abnormalities of gait and mobility (R26.89);Muscle weakness (generalized) (M62.81);History of falling (Z91.81);Other symptoms and signs involving the nervous system (R29.898);Other symptoms and signs involving cognitive function;Pain  Pain - part of body  (back)  OT Problem List Decreased strength;Decreased range of motion;Decreased activity tolerance;Impaired balance (sitting and/or standing);Decreased coordination;Decreased cognition;Decreased knowledge of use of DME or AE;Decreased knowledge of precautions;Decreased safety awareness;Cardiopulmonary status  limiting activity;Obesity;Impaired UE functional use;Pain  OT Plan  OT Frequency (ACUTE ONLY) Min 2X/week  OT Treatment/Interventions (ACUTE ONLY) Self-care/ADL training;Therapeutic exercise;Energy conservation;DME and/or AE instruction;Therapeutic activities;Cognitive remediation/compensation;Patient/family education;Balance training  AM-PAC OT "6 Clicks" Daily Activity Outcome Measure (Version 2)  Help from another person eating meals? 4  Help from another person taking care of personal grooming? 3  Help from another person toileting, which includes using toliet, bedpan, or urinal? 2  Help from another person bathing (including washing, rinsing, drying)? 2  Help from another person to put on and taking off regular upper body clothing? 3  Help from another person to put on and taking off regular lower body clothing? 2  6 Click Score 16  Progressive Mobility  What is the highest level of mobility based on the progressive mobility assessment? Level 2 (Chairfast) - Balance while sitting on edge of bed and cannot stand  Mobility Sit up in bed/chair position for meals  OT Recommendation  Follow Up Recommendations Skilled nursing-short term rehab (<3 hours/day)  Assistance recommended at discharge Frequent or constant Supervision/Assistance (pending progress)  Functional Status Assessent Patient has had a recent decline in their functional status and demonstrates the ability to make significant improvements in function in a reasonable and predictable amount of time.  OT Equipment None recommended by OT  Individuals Consulted  Consulted and Agree with Results and Recommendations Patient  Acute Rehab OT Goals  Patient Stated Goal to feel better  OT Goal Formulation With patient  Time For Goal Achievement 06/21/21  Potential to Achieve Goals Good  OT Time Calculation  OT Start Time (ACUTE ONLY) 1342  OT Stop Time (ACUTE ONLY) 1409  OT Time Calculation (min) 27 min  OT General Charges  $OT  Visit 1 Visit  OT Evaluation  $OT Eval Moderate Complexity 1 Mod  Written Expression  Dominant Hand Right  [facility], OT 06/07/2021

## 2021-06-07 NOTE — ED Notes (Signed)
Pt O2 85%. Switched pulse ox cord to verify correct O2 86%. Applied 2L nasal canula 92%

## 2021-06-07 NOTE — H&P (Addendum)
History and Physical    Kathy Scott VZD:[id number] DOB: 1951/06/21 DOA: 06/06/2021  Referring MD/NP/PA: Shela Leff, MD PCP: Harlan Stains, MD  Consultants: Andrey Spearman, MD-neurology Patient coming from: Home via EMS  Chief Complaint: Seizure  I have personally briefly reviewed patient's old medical records in [place]   HPI: Kathy Scott is a 70 y.o. female with medical history significant of hypertension, hypothyroidism, seizure disorder, colon cancer s/p resection presents after having seizure yesterday.  History is obtained from patient with the assistance of her brother she has limited recollection of what occurred yesterday.  Her coworker had attempted to call her several times because they were supposed to go to a banquet at 11 AM, but was unable to reach her.  Normally patient is very prompt and answers her phone which gave him concern.  He called EMS to do a welfare check at that time. EMS witnessed tonic-clonic seizure lasting approximately 20 seconds thereafter patient noted to be postictal.  Patient admits that she did bite her tongue.  Her brother who is present at bedside notes that yesterday she was unable to remember anything and had been complaining of back pain.  Patient appears back to her baseline at this time and reports that her last seizure was approximately 5 years ago.  She is followed by neurology in the outpatient setting and had an appointment last in August where everything was noted to be stable.  Patient denies any other significant complaints and had been in her normal state of health.  Her brother notes that she had recently been under a lot of stress with the passing of a first cousin and another family member here recently.  Patient reports that she was unable to go to the funeral in [place] that occurred last Friday.  ED Course: Upon admission into the emergency department patient was seen to be afebrile with pulse 83-109,  respirations 18-36, blood pressure 97/83-215/68, and O2 saturation currently maintained on room air.  CT scan of the brain showed no acute abnormality and has small subcentimeter falcine meningioma labs significant for WBC 23.4, platelets 432, potassium 2.3, CO2 15, BUN 12, creatinine 1.19, glucose 237, AST 42, ammonia 36, and lactic acid greater than 9->1.1.  Chest x-ray noted pulmonary vascular congestion concerning for mild interstitial edema.  Urinalysis positive for moderate hemoglobin without significant RBCs/hpf, and no significant signs of infection.  MRI was negative for any acute abnormality and noted 7 mm suspected meningioma.  Patient had been given Keppra 1000 mg IV x1 dose, Ativan IV for MRI, potassium chloride 60 mEq IV ordered, and potassium chloride 40 mill equivalents p.o.  Review of Systems  Constitutional:  Negative for fever.  HENT:  Negative for congestion and nosebleeds.   Eyes:  Negative for photophobia and pain.  Respiratory:  Negative for cough and shortness of breath.   Cardiovascular:  Negative for chest pain and leg swelling.  Gastrointestinal:  Negative for abdominal pain, diarrhea and vomiting.  Musculoskeletal:  Positive for back pain.  Skin:  Negative for rash.  Neurological:  Positive for seizures.  Psychiatric/Behavioral:  Positive for memory loss.    Past Medical History:  Diagnosis Date   Cancer [facility])    Colon (Stage 3)   Foot fracture    corrected non surgically   Hypertension    Hypothyroidism    Osteoporosis    Seizures ([place])    last sz ~ 2014    Past Surgical History:  Procedure Laterality Date  Carlos SURGERY  06/2018   [facility], replacement   COLON SURGERY  2003   [facility]     reports that she has quit smoking. Her smoking use included cigarettes. She has a 2.00 pack-year smoking history. She has never used smokeless tobacco. She reports current alcohol use. She reports that she does not use  drugs.  Allergies  Allergen Reactions   Ibandronic Acid Other (See Comments)   Other Rash    Adhesive badages   Raloxifene Other (See Comments)    Joint pain    Family History  Problem Relation Age of Onset   Heart failure Mother    Diverticulitis Mother    Colon cancer Other        Maternal & Paternal   Hypotension Other    Breast cancer Other        Aunt   Diverticulitis Brother    Hypotension Brother     Prior to Admission medications   Medication Sig Start Date End Date Taking? Authorizing Provider  allopurinol (ZYLOPRIM) 300 MG tablet Take 300 mg by mouth daily. 01/26/21   [provider]  amLODipine (NORVASC) 10 MG tablet Take 10 mg by mouth daily.    [provider]  calcium-vitamin D (OSCAL WITH D) 500-200 MG-UNIT per tablet Take 2 tablets by mouth.    [provider]  Cholecalciferol (VITAMIN D3) 1000 units CAPS Take by mouth.    [provider]  levETIRAcetam (KEPPRA XR) 500 MG 24 hr tablet Take 2 tablets (1,000 mg total) by mouth daily. 03/07/21   Penumalli, Earlean Polka, MD  Magnesium 500 MG CAPS Take 500 mg by mouth daily.    [provider]  metoprolol succinate (TOPROL-XL) 50 MG 24 hr tablet 150 mg daily. 10/01/18   [provider]  Multiple Vitamins-Minerals (MULTIVITAMIN ADULT PO) Take 1 tablet by mouth daily.    [provider]  omeprazole (PRILOSEC) 40 MG capsule Take 40 mg by mouth daily.    [provider]  predniSONE (DELTASONE) 5 MG tablet 2 tablets    [provider]  SYNTHROID 50 MCG tablet Take 1 tablet by mouth daily. 10/23/12   [provider]  valsartan-hydrochlorothiazide (DIOVAN-HCT) 320-25 MG tablet  09/04/16   [provider]  vitamin B-12 (CYANOCOBALAMIN) 500 MCG tablet Take 500 mcg by mouth daily.    [provider]    Physical Exam:  Constitutional: Elderly female currently in no acute distress at this time Vitals:   06/07/21 0600 06/07/21  0605 06/07/21 0615 06/07/21 0700  BP:  (!) 152/90  (!) 148/103  Pulse: 86 91 93 96  Resp: (!) 21 (!) 23 (!) 36 (!) 27  Temp:      TempSrc:      SpO2: 93% 93% 94% 99%   Eyes: PERRL, lids and conjunctivae normal ENMT: Mucous membranes are moist.  Tongue bite present. Neck: normal, supple, no masses, no thyromegaly Respiratory: Mildly tachypneic, but otherwise appears clear to auscultation.. Normal respiratory effort.  Talks in short sentences but denies being short of breath Cardiovascular: Regular rate and rhythm, no murmurs / rubs / gallops. No extremity edema. 2+ pedal pulses. No carotid bruits.  Abdomen: no tenderness, no masses palpated. No hepatosplenomegaly. Bowel sounds positive.  Musculoskeletal: no clubbing / cyanosis.  Tenderness palpation of the mid thoracic back Skin: Healed surgical scar on the left ankle Neurologic: CN 2-12 grossly intact. Sensation intact, DTR normal. Strength 5/5 in all  4.  Psychiatric:  Alert and oriented x person and place, but not time. Normal mood.     Labs on Admission: I have personally reviewed following labs and imaging studies  CBC: Recent Labs  Lab 06/06/21 1217  WBC 23.4*  NEUTROABS 20.3*  HGB 13.7  HCT 40.7  MCV 94.2  PLT 443*   Basic Metabolic Panel: Recent Labs  Lab 06/06/21 1217  NA 140  K 2.3*  CL 106  CO2 15*  GLUCOSE 237*  BUN 12  CREATININE 1.19*  CALCIUM 9.5   GFR: CrCl cannot be calculated (Unknown ideal weight.). Liver Function Tests: Recent Labs  Lab 06/06/21 1217  AST 42*  ALT 27  ALKPHOS 116  BILITOT 0.5  PROT 8.0  ALBUMIN 3.8   No results for input(s): LIPASE, AMYLASE in the last 168 hours. Recent Labs  Lab 06/06/21 1225  AMMONIA 36*   Coagulation Profile: No results for input(s): INR, PROTIME in the last 168 hours. Cardiac Enzymes: No results for input(s): CKTOTAL, CKMB, CKMBINDEX, TROPONINI in the last 168 hours. BNP (last 3 results) No results for input(s): PROBNP in the last 8760  hours. HbA1C: No results for input(s): HGBA1C in the last 72 hours. CBG: Recent Labs  Lab 06/06/21 1222  GLUCAP 203*   Lipid Profile: No results for input(s): CHOL, HDL, LDLCALC, TRIG, CHOLHDL, LDLDIRECT in the last 72 hours. Thyroid Function Tests: No results for input(s): TSH, T4TOTAL, FREET4, T3FREE, THYROIDAB in the last 72 hours. Anemia Panel: No results for input(s): VITAMINB12, FOLATE, FERRITIN, TIBC, IRON, RETICCTPCT in the last 72 hours. Urine analysis:    Component Value Date/Time   COLORURINE YELLOW 06/06/2021 1944   APPEARANCEUR HAZY (A) 06/06/2021 1944   LABSPEC 1.019 06/06/2021 1944   PHURINE 5.0 06/06/2021 1944   GLUCOSEU NEGATIVE 06/06/2021 1944   HGBUR MODERATE (A) 06/06/2021 1944   BILIRUBINUR NEGATIVE 06/06/2021 1944   KETONESUR 5 (A) 06/06/2021 1944   PROTEINUR 100 (A) 06/06/2021 1944   NITRITE NEGATIVE 06/06/2021 1944   LEUKOCYTESUR NEGATIVE 06/06/2021 1944   Sepsis Labs: No results found for this or any previous visit (from the past 240 hour(s)).   Radiological Exams on Admission: CT Head Wo Contrast  Result Date: 06/06/2021 CLINICAL DATA:  Mental status change, unknown cause EXAM: CT HEAD WITHOUT CONTRAST TECHNIQUE: Contiguous axial images were obtained from the base of the skull through the vertex without intravenous contrast. COMPARISON:  None. FINDINGS: Brain: There is no acute intracranial hemorrhage, mass effect, or edema. Gray-white differentiation is preserved. There is no extra-axial fluid collection. Small subcentimeter mildly hyperdense lesion along the falx likely represents a meningioma. Ventricles and sulci are within normal limits in size and configuration. Vascular: There is atherosclerotic calcification at the skull base. Skull: Calvarium is unremarkable. Sinuses/Orbits: No acute finding. Other: None. IMPRESSION: No acute intracranial abnormality. Small subcentimeter falcine meningioma. Electronically Signed   By: Macy Mis M.D.   On:  06/06/2021 13:59   MR BRAIN WO CONTRAST  Result Date: 06/07/2021 CLINICAL DATA:  Altered mental status EXAM: MRI HEAD WITHOUT CONTRAST TECHNIQUE: Multiplanar, multiecho pulse sequences of the brain and surrounding structures were obtained without intravenous contrast. COMPARISON:  Head CT from yesterday FINDINGS: Brain: No acute infarction, hemorrhage, hydrocephalus, extra-axial collection or mass effect. 7 mm mass along the inferior margin of the mid falx. Age normal brain volume and white matter appearance Vascular: Preserved flow voids Skull and upper cervical spine: No focal marrow lesion. Cervical spine degeneration with anterolisthesis at C3-4. Sinuses/Orbits: Negative IMPRESSION:  1. No acute finding. 2. 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 3. Intermittent motion artifact. Electronically Signed   By: Jorje Guild M.D.   On: 06/07/2021 06:15   DG Chest Port 1 View  Result Date: 06/06/2021 CLINICAL DATA:  Altered mental status EXAM: PORTABLE CHEST 1 VIEW COMPARISON:  11/27/2008 FINDINGS: Heart size within normal limits. Low lung volumes. Mild pulmonary vascular congestion with increased interstitial markings bilaterally. No pleural effusion or pneumothorax. IMPRESSION: Mild pulmonary vascular congestion with increased interstitial markings bilaterally, may reflect mild interstitial edema. Electronically Signed   By: Davina Poke D.O.   On: 06/06/2021 12:51    EKG: Independently reviewed.  Sinus tachycardia 109 bpm with QT prolonged at 496  Assessment/Plan  Breakthrough seizure  postictal: Acute.  Patient presents after being noted to be acutely altered by coworker.  Witnessed having seizure activity with EMS.  Last seizure reported to be 5 years ago.  Brother reported that patient was altered following the seizure and was not able to remember much of anything, but appears to be back to baseline.  Patient did suffer a tongue bite and complaints of back pain at this time.  Followed by  neurology in the outpatient setting.  She had been loaded with Keppra 1000 mg IV.  MRI did not note any acute signs of a stroke.  No further seizure activity noted while in the ED.  Possibly related with stress -Admit to a medical telemetry -Seizure precautions -Check CK and UDS -Ativan IV as needed for seizure activity -Normal saline IV fluids at 75 mL per -Case discussed with Dr. Quinn Axe of neurology over the phone who recommended increasing Keppra to 1250 mg twice daily  Lactic acidosis metabolic acidosis with elevated anion gap: From yesterday noted CO2 of 15 with anion gap 19.  Lactic acid was elevated greater than 9 and glucose was only 237.  Suspect likely acid secondary to seizure as the likely cause of symptoms.  Repeat lactic acid was within normal limits. -Follow-up repeat BMP  Leukocytosis thrombocytosis: Acute.  Initial labs noted WBC 23.4 and platelet count 432.  Suspect reactive in nature to seizure.  Urinalysis and chest x-ray did not note -Recheck CBC  Hypokalemia: On admission yesterday potassium 2.3.  Patient had been ordered potassium chloride for total of 100 mEq.  Repeat check was spent -Check magnesium level -Continue to monitor and replace as needed  Tongue bite: Secondary to seizure. -Magic mouthwash with lidocaine as needed  Thoracic back pain: Patient complains of midthoracic back pain following seizure. -Check x-rays of the back -Lidocaine patch  Elevated ammonia: Acute.  Ammonia was mildly elevated at 36. -Continue outpatient follow-up  Transaminitis: AST 42 on admission.  No reported history of hepatitis.  This could be related to a  early rhabdomyolysis picture with recent seizure. -Add on hepatitis panel  Prolonged QT interval: Acute.  Initial EKG noted QTC 496. -Correct electrolyte abnormality -Recheck EKG  Hypothyroidism -Check TSH -Continue levothyroxine  Brain mass: Imaging studies noted incidental finding of 7 mm presumed falcine  meningioma. -Follow-up with neurology in regards to this   DVT prophylaxis: Lovenox Code Status: Full Family Communication: Brother updated at bedside Disposition Plan: Hopefully discharge home once medically stable Consults called: Neurology Admission status: Observation  Norval Morton MD [facility]   If 7PM-7AM, please contact night-coverage   06/07/2021, 7:17 AM

## 2021-06-07 NOTE — Progress Notes (Signed)
Patient did not appear to be acutely fluid overloaded on physical exam.  However was noted to have a drop in O2 saturation down to 85%.  She had been initially placed on gentle fluids recent seizure and concern for rhabdo myelitis, but initial chest x-ray did not note concern for pulmonary edema.  BNP was noted to be within normal limits.  Check echocardiogram to formally evaluate heart function.  Orders placed for Lasix 40 mg IV x1 dose, and discontinued IV fluids.

## 2021-06-08 ENCOUNTER — Observation Stay (HOSPITAL_COMMUNITY): Payer: Medicare Other

## 2021-06-08 DIAGNOSIS — D75839 Thrombocytosis, unspecified: Secondary | ICD-10-CM | POA: Diagnosis present

## 2021-06-08 DIAGNOSIS — R4182 Altered mental status, unspecified: Secondary | ICD-10-CM

## 2021-06-08 DIAGNOSIS — T796XXA Traumatic ischemia of muscle, initial encounter: Secondary | ICD-10-CM

## 2021-06-08 DIAGNOSIS — E876 Hypokalemia: Secondary | ICD-10-CM | POA: Diagnosis present

## 2021-06-08 DIAGNOSIS — I110 Hypertensive heart disease with heart failure: Secondary | ICD-10-CM | POA: Diagnosis present

## 2021-06-08 DIAGNOSIS — E669 Obesity, unspecified: Secondary | ICD-10-CM | POA: Diagnosis present

## 2021-06-08 DIAGNOSIS — G40909 Epilepsy, unspecified, not intractable, without status epilepticus: Secondary | ICD-10-CM | POA: Diagnosis not present

## 2021-06-08 DIAGNOSIS — M8588 Other specified disorders of bone density and structure, other site: Secondary | ICD-10-CM | POA: Diagnosis present

## 2021-06-08 DIAGNOSIS — I5033 Acute on chronic diastolic (congestive) heart failure: Secondary | ICD-10-CM | POA: Diagnosis not present

## 2021-06-08 DIAGNOSIS — Z79899 Other long term (current) drug therapy: Secondary | ICD-10-CM | POA: Diagnosis not present

## 2021-06-08 DIAGNOSIS — E039 Hypothyroidism, unspecified: Secondary | ICD-10-CM | POA: Diagnosis not present

## 2021-06-08 DIAGNOSIS — Z6832 Body mass index (BMI) 32.0-32.9, adult: Secondary | ICD-10-CM | POA: Diagnosis not present

## 2021-06-08 DIAGNOSIS — E872 Acidosis, unspecified: Secondary | ICD-10-CM | POA: Diagnosis not present

## 2021-06-08 DIAGNOSIS — S22039A Unspecified fracture of third thoracic vertebra, initial encounter for closed fracture: Secondary | ICD-10-CM | POA: Diagnosis present

## 2021-06-08 DIAGNOSIS — Z87891 Personal history of nicotine dependence: Secondary | ICD-10-CM | POA: Diagnosis not present

## 2021-06-08 DIAGNOSIS — Z803 Family history of malignant neoplasm of breast: Secondary | ICD-10-CM | POA: Diagnosis not present

## 2021-06-08 DIAGNOSIS — M810 Age-related osteoporosis without current pathological fracture: Secondary | ICD-10-CM | POA: Diagnosis present

## 2021-06-08 DIAGNOSIS — X58XXXA Exposure to other specified factors, initial encounter: Secondary | ICD-10-CM | POA: Diagnosis present

## 2021-06-08 DIAGNOSIS — S32019A Unspecified fracture of first lumbar vertebra, initial encounter for closed fracture: Secondary | ICD-10-CM | POA: Diagnosis present

## 2021-06-08 DIAGNOSIS — T8853XD Unintended awareness under general anesthesia during procedure, subsequent encounter: Secondary | ICD-10-CM | POA: Diagnosis present

## 2021-06-08 DIAGNOSIS — Z800 Family history of malignant neoplasm of digestive organs: Secondary | ICD-10-CM | POA: Diagnosis not present

## 2021-06-08 DIAGNOSIS — I10 Essential (primary) hypertension: Secondary | ICD-10-CM

## 2021-06-08 DIAGNOSIS — D72824 Basophilia: Secondary | ICD-10-CM

## 2021-06-08 DIAGNOSIS — Z20822 Contact with and (suspected) exposure to covid-19: Secondary | ICD-10-CM | POA: Diagnosis present

## 2021-06-08 DIAGNOSIS — I509 Heart failure, unspecified: Secondary | ICD-10-CM

## 2021-06-08 DIAGNOSIS — G40409 Other generalized epilepsy and epileptic syndromes, not intractable, without status epilepticus: Secondary | ICD-10-CM | POA: Diagnosis present

## 2021-06-08 DIAGNOSIS — Z79890 Hormone replacement therapy (postmenopausal): Secondary | ICD-10-CM | POA: Diagnosis not present

## 2021-06-08 DIAGNOSIS — J9601 Acute respiratory failure with hypoxia: Secondary | ICD-10-CM | POA: Diagnosis not present

## 2021-06-08 DIAGNOSIS — M4804 Spinal stenosis, thoracic region: Secondary | ICD-10-CM | POA: Diagnosis not present

## 2021-06-08 DIAGNOSIS — I371 Nonrheumatic pulmonary valve insufficiency: Secondary | ICD-10-CM | POA: Diagnosis not present

## 2021-06-08 DIAGNOSIS — G40919 Epilepsy, unspecified, intractable, without status epilepticus: Secondary | ICD-10-CM | POA: Diagnosis not present

## 2021-06-08 DIAGNOSIS — S22069A Unspecified fracture of T7-T8 vertebra, initial encounter for closed fracture: Secondary | ICD-10-CM | POA: Diagnosis not present

## 2021-06-08 DIAGNOSIS — Z8249 Family history of ischemic heart disease and other diseases of the circulatory system: Secondary | ICD-10-CM | POA: Diagnosis not present

## 2021-06-08 DIAGNOSIS — Z888 Allergy status to other drugs, medicaments and biological substances status: Secondary | ICD-10-CM | POA: Diagnosis not present

## 2021-06-08 DIAGNOSIS — I5031 Acute diastolic (congestive) heart failure: Secondary | ICD-10-CM | POA: Diagnosis not present

## 2021-06-08 DIAGNOSIS — Z85038 Personal history of other malignant neoplasm of large intestine: Secondary | ICD-10-CM | POA: Diagnosis not present

## 2021-06-08 DIAGNOSIS — R5381 Other malaise: Secondary | ICD-10-CM

## 2021-06-08 DIAGNOSIS — S22059A Unspecified fracture of T5-T6 vertebra, initial encounter for closed fracture: Secondary | ICD-10-CM | POA: Diagnosis not present

## 2021-06-08 DIAGNOSIS — M6282 Rhabdomyolysis: Secondary | ICD-10-CM | POA: Diagnosis not present

## 2021-06-08 LAB — COMPREHENSIVE METABOLIC PANEL
ALT: 18 U/L (ref 0–44)
AST: 25 U/L (ref 15–41)
Albumin: 3.4 g/dL — ABNORMAL LOW (ref 3.5–5.0)
Alkaline Phosphatase: 85 U/L (ref 38–126)
Anion gap: 9 (ref 5–15)
BUN: 10 mg/dL (ref 8–23)
CO2: 22 mmol/L (ref 22–32)
Calcium: 9.2 mg/dL (ref 8.9–10.3)
Chloride: 108 mmol/L (ref 98–111)
Creatinine, Ser: 0.99 mg/dL (ref 0.44–1.00)
GFR, Estimated: >60 mL/min (ref >60)
Glucose, Bld: 110 mg/dL — ABNORMAL HIGH (ref 70–99)
Potassium: 3.7 mmol/L (ref 3.5–5.1)
Sodium: 139 mmol/L (ref 135–145)
Total Bilirubin: 1 mg/dL (ref 0.3–1.2)
Total Protein: 7.2 g/dL (ref 6.5–8.1)

## 2021-06-08 LAB — ECHOCARDIOGRAM COMPLETE
Area-P 1/2: 3.72 cm2
Height: 58 in
S' Lateral: 3 cm
Weight: 2476.21 oz

## 2021-06-08 LAB — CK: Total CK: 551 U/L — ABNORMAL HIGH (ref 38–234)

## 2021-06-08 IMAGING — MR MR THORACIC SPINE W/O CM
5 of 6 series · 26 of 48 positions shown · non-contrast
Comparison: None.

EXAM:
MRI THORACIC SPINE WITHOUT CONTRAST
TECHNIQUE: Multiplanar, multisequence MR imaging of the thoracic spine was
performed. No intravenous contrast was administered.

[Series 14: T1 · sagittal · 6.0mm · 1.23mm/px · 4 of 12 slices shown (1 of 2)]
[im 1/12]
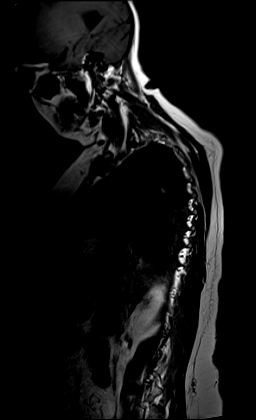
[im 4/12]
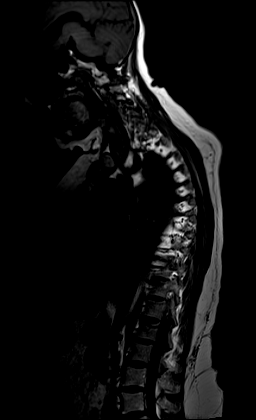
[im 8/12]
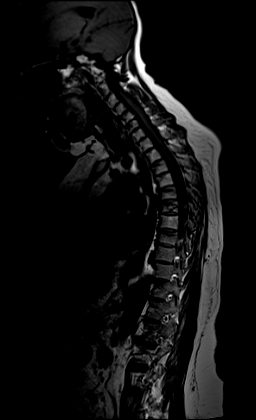
[im 12/12]
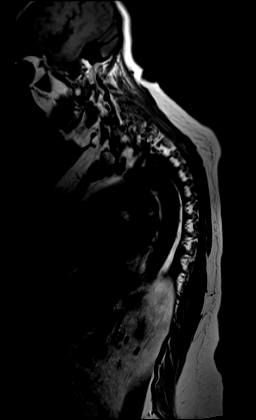

[Series 15: T2 · sagittal · 3.0mm · 0.76mm/px · 6 of 17 slices shown (1 of 2)]
[im 1/17]
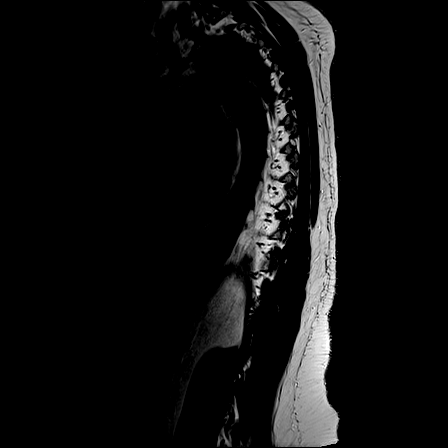
[im 4/17]
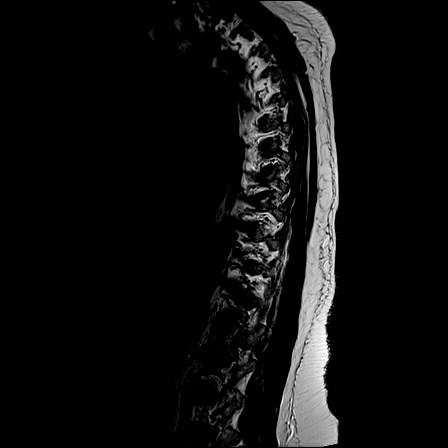
[im 7/17]
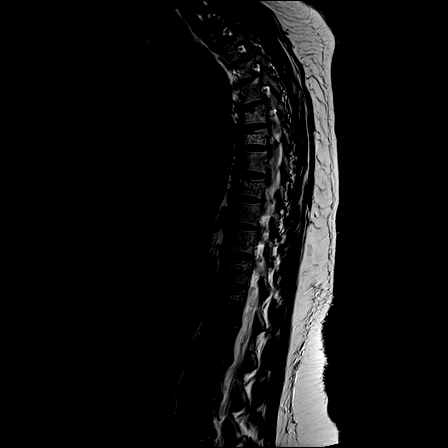
[im 10/17]
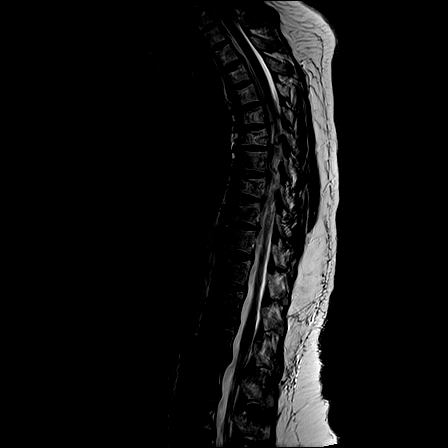
[im 13/17]
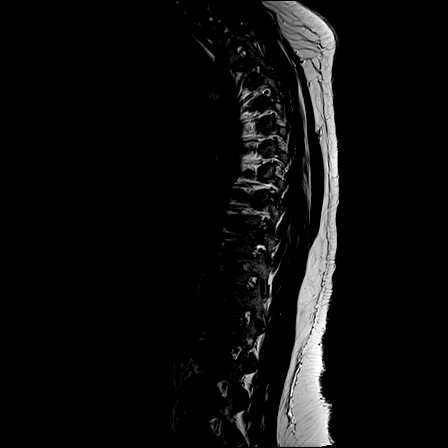
[im 17/17]
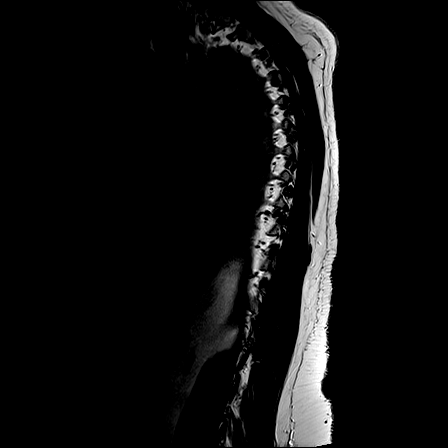

[Series 16: T1 · sagittal · 3.0mm · 0.76mm/px · 6 of 17 slices shown (2 of 2)]
[im 1/17]
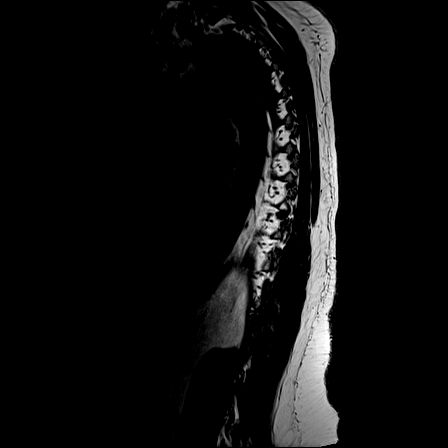
[im 4/17]
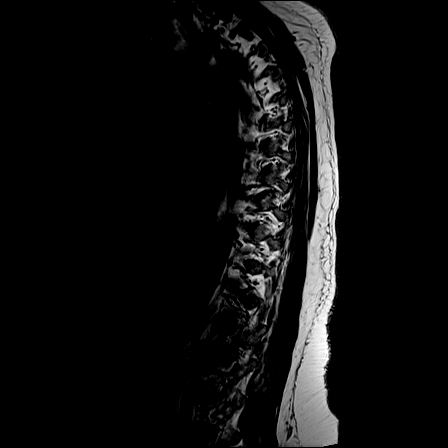
[im 7/17]
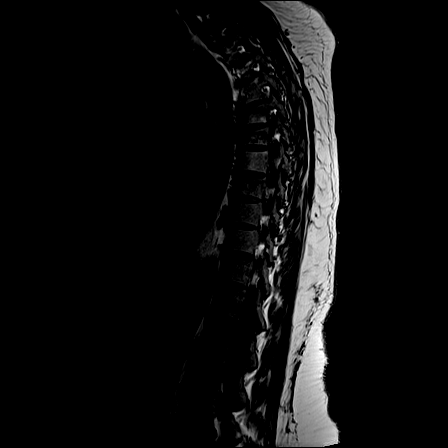
[im 10/17]
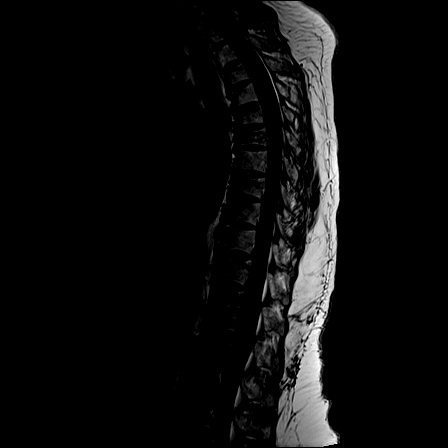
[im 13/17]
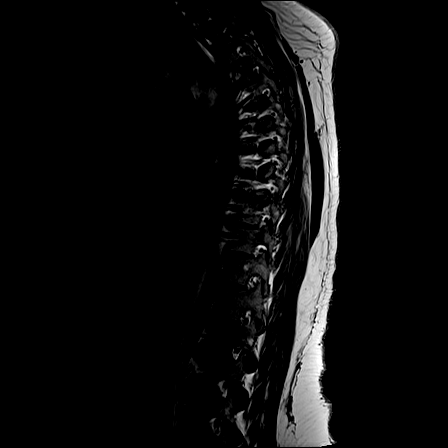
[im 17/17]
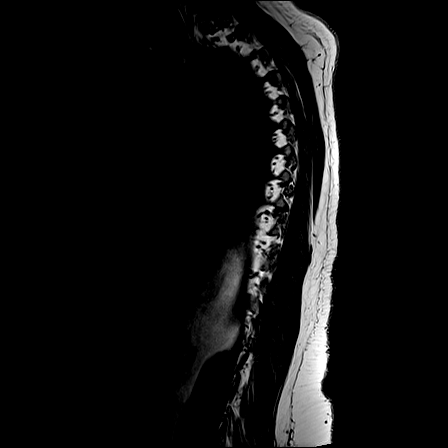

[Series 17: STIR · sagittal · 3.0mm · 0.38mm/px · 1 of 17 slices shown]
[im 1/17]
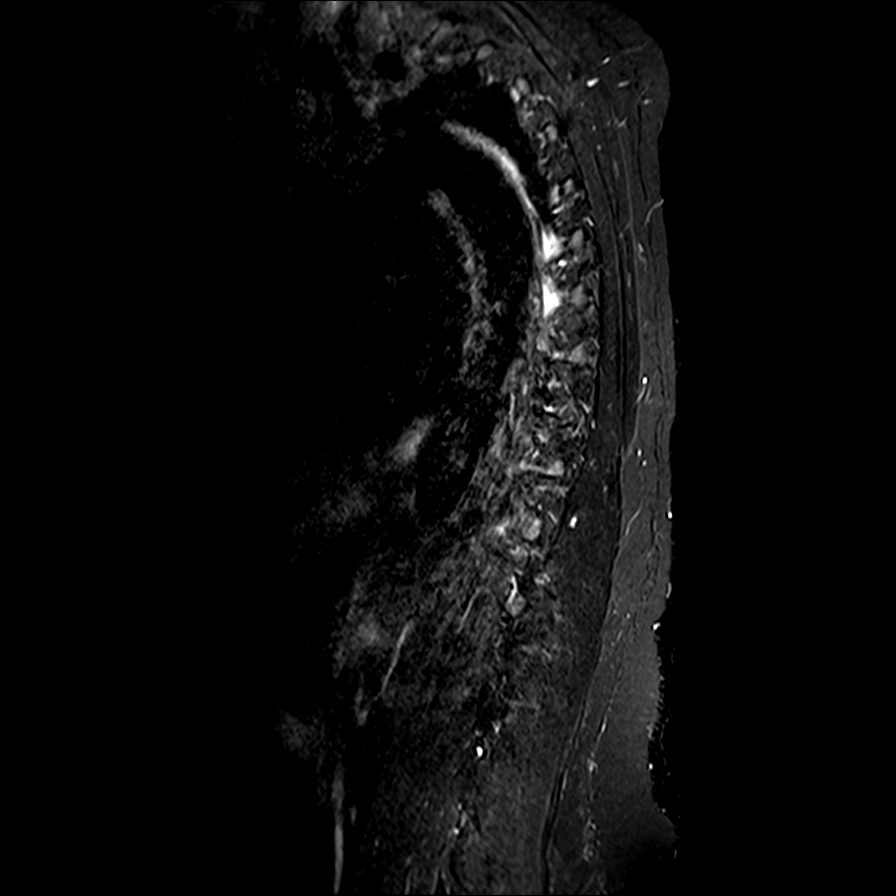

[Series 18: T2 · axial · 4.0mm · 0.59mm/px · z∈[-173,-4]mm · 9 of 40 slices shown (2 of 2)]
[im 1/40]
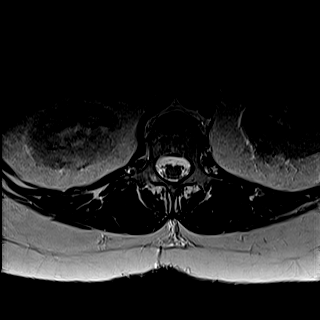
[im 7/40]
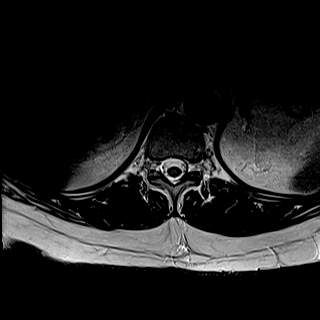
[im 14/40]
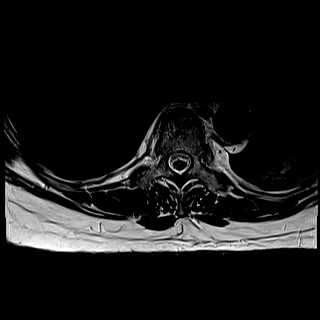
[im 17/40]
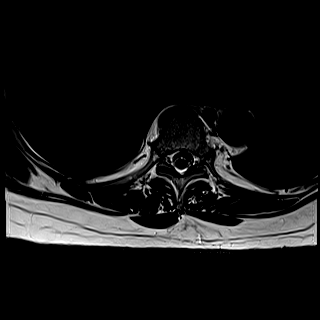
[im 20/40]
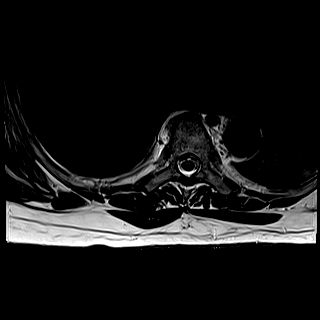
[im 23/40]
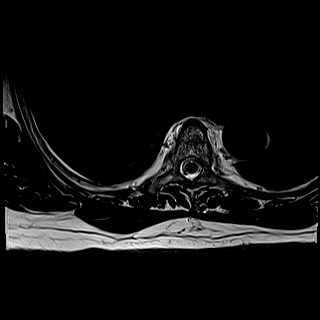
[im 27/40]
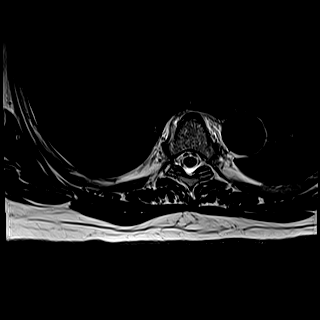
[im 33/40]
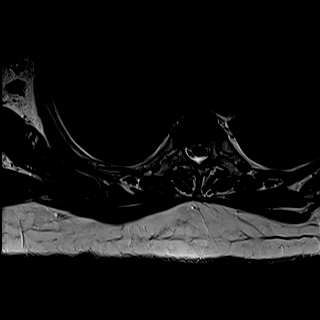
[im 40/40]
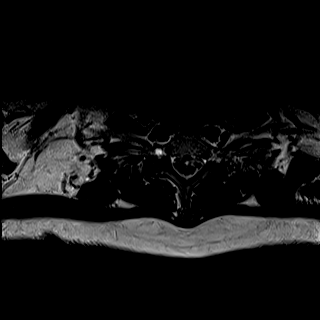

[26 of 48 positions shown; findings below may reference images not displayed]

FINDINGS: Alignment: No substantial sagittal subluxation. Mildly exaggerated
thoracic kyphosis.

Vertebrae: Superior endplate fractures at T3, T5, T6, T8, T11, T12,
and L1. Associated marrow edema, compatible with acute/recent
fractures. Subtle fracture lines at multiple levels. Approximately
30% height loss at T5 and T6. Approximately 20% height loss at T8
and T3. Slight height loss at T11, T12, and L1. No significant bony
retropulsion. There is subtle T2/stir hypointense linear signal
within the ventral canal in the upper to midthoracic spine on
sagittal sequences which is not confirmed on axial sequences and
therefore favored to reflect artifactual CSF pulsation artifact.

Cord:  Normal cord signal.

Paraspinal and other soft tissues: Probable small right pleural
effusion with suspect dependent lung opacities, poorly evaluated by
MRI.

Disc levels:

Small disc bulges at multiple levels without significant canal
stenosis. Multilevel facet arthropathy w with moderate foraminal
stenosis on the right at T5-T6, T6-T7, T7-T8, and T8-T9. Otherwise,
multilevel mild foraminal stenosis.
IMPRESSION: 1. Acute/recent superior endplate fractures at T3, T5, T6, T8, T11,
T12, and L1. Up to 30% height loss, as detailed above. No
significant bony retropulsion.
2. Moderate foraminal stenosis on the right at T5-T6, T6-T7, T7-T8,
and T8-T9. Otherwise, multilevel mild foraminal stenosis. No
significant canal stenosis.
3. Probable small right pleural effusion with suspect dependent lung
opacities, poorly evaluated by MRI.

## 2021-06-08 MED ORDER — FUROSEMIDE 10 MG/ML IJ SOLN
40.0000 mg | Freq: Every day | INTRAMUSCULAR | Status: DC
  Administered 2021-06-08 – 2021-06-09 (×2): 40 mg via INTRAVENOUS
  Filled 2021-06-08 (×2): qty 4

## 2021-06-08 NOTE — Care Management Obs Status (Signed)
[place] NOTIFICATION   Patient Details  Name: FELESHA MONCRIEFFE MRN: [id number] Date of Birth: [birth year]   Medicare Observation Status Notification Given:  Yes    Cyndi Bender, RN 06/08/2021, 10:15 AM

## 2021-06-08 NOTE — Plan of Care (Signed)

## 2021-06-08 NOTE — Progress Notes (Signed)
  Echocardiogram 2D Echocardiogram has been performed.  Kathy Scott 06/08/2021, 12:10 PM

## 2021-06-08 NOTE — Progress Notes (Signed)
Patient ambulated with walker in room on room air after ten feet O2 dropped to 86% placed on 2 liters and recovered to 90% walked back to chair and placed back on room air.

## 2021-06-08 NOTE — Progress Notes (Signed)
Occupational Therapy Treatment Patient Details Name: Kathy Scott MRN: [id number] DOB: [birth year] Today's Date: 06/08/2021   History of present illness Pt is a 70 y/o female admitted 11/21 secondary to AMS and breakthrough seizures. Imaging showed mild compression deformity in thoracic spine.  PMH includes seizures, colon cancer, and HTN.   OT comments  Patient received in bed and was eager to get out of bed. Patient was able to get to eob and transfer to recliner with assistance of one and increased time to rest between tasks. Patient was setup for grooming seated in recliner.  Patient is making good progress with OT and is eager to return home. Acute OT to continue to follow.    Recommendations for follow up therapy are one component of a multi-disciplinary discharge planning process, led by the attending physician.  Recommendations may be updated based on patient status, additional functional criteria and insurance authorization.    Follow Up Recommendations  Skilled nursing-short term rehab (<3 hours/day)    Assistance Recommended at Discharge Frequent or constant Supervision/Assistance  Equipment Recommendations  None recommended by OT    Recommendations for Other Services      Precautions / Restrictions Precautions Precautions: Fall;Back Precaution Booklet Issued: No Precaution Comments: RR/SOB; BP Restrictions Weight Bearing Restrictions: No       Mobility Bed Mobility Overal bed mobility: Needs Assistance Bed Mobility: Rolling;Sidelying to Sit Rolling: Mod assist Sidelying to sit: Mod assist       General bed mobility comments: patient was able to get to EOB with assistance of one    Transfers Overall transfer level: Needs assistance Equipment used: Rolling walker (2 wheels) Transfers: Bed to chair/wheelchair/BSC     Step pivot transfers: Mod assist       General transfer comment: performed with RW and verbal cues for safety     Balance Overall  balance assessment: Needs assistance Sitting-balance support: Bilateral upper extremity supported Sitting balance-Leahy Scale: Poor Sitting balance - Comments: Reliant on BUE support   Standing balance support: Bilateral upper extremity supported Standing balance-Leahy Scale: Poor Standing balance comment: reliant on RW for support                           ADL either performed or assessed with clinical judgement   ADL Overall ADL's : Needs assistance/impaired     Grooming: Wash/dry hands;Wash/dry face;Oral care;Brushing hair;Sitting;Set up Grooming Details (indicate cue type and reason): performed seated in recliner                 Toilet Transfer: Moderate assistance;Rolling walker (2 wheels) Toilet Transfer Details (indicate cue type and reason): simulated with recliner           General ADL Comments: patient performed transfer wtih RW and assistance of one    Extremity/Trunk Assessment              Vision       Perception     Praxis      Cognition Arousal/Alertness: Awake/alert Behavior During Therapy: Restless Overall Cognitive Status: Impaired/Different from baseline Area of Impairment: Orientation;Attention;Memory;Safety/judgement;Awareness;Problem solving                 Orientation Level: Disoriented to;Time Current Attention Level: Selective Memory: Decreased recall of precautions;Decreased short-term memory   Safety/Judgement: Decreased awareness of safety;Decreased awareness of deficits Awareness: Emergent Problem Solving: Slow processing General Comments: eager to get out of bed  Exercises     Shoulder Instructions       General Comments      Pertinent Vitals/ Pain       Pain Assessment: Faces Faces Pain Scale: Hurts even more Pain Location: back Pain Descriptors / Indicators: Moaning;Grimacing;Guarding Pain Intervention(s): Limited activity within patient's tolerance;Repositioned;Monitored during  session  Home Living                                          Prior Functioning/Environment              Frequency  Min 2X/week        Progress Toward Goals  OT Goals(current goals can now be found in the care plan section)  Progress towards OT goals: Progressing toward goals  Acute Rehab OT Goals Patient Stated Goal: go home OT Goal Formulation: With patient Time For Goal Achievement: 06/21/21 Potential to Achieve Goals: Good ADL Goals Pt Will Perform Lower Body Bathing: with modified independence Pt Will Perform Lower Body Dressing: with modified independence Pt Will Transfer to Toilet: with modified independence Pt Will Perform Toileting - Clothing Manipulation and hygiene: with modified independence Additional ADL Goal #1: Pt will receive a passing score on the pillbox assessment  Plan Discharge plan remains appropriate    Co-evaluation                 AM-PAC OT "6 Clicks" Daily Activity     Outcome Measure   Help from another person eating meals?: None Help from another person taking care of personal grooming?: A Little Help from another person toileting, which includes using toliet, bedpan, or urinal?: A Lot Help from another person bathing (including washing, rinsing, drying)?: A Lot Help from another person to put on and taking off regular upper body clothing?: A Little Help from another person to put on and taking off regular lower body clothing?: A Lot 6 Click Score: 16    End of Session Equipment Utilized During Treatment: Gait belt;Rolling walker (2 wheels)  OT Visit Diagnosis: Unsteadiness on feet (R26.81);Other abnormalities of gait and mobility (R26.89);Muscle weakness (generalized) (M62.81);History of falling (Z91.81);Other symptoms and signs involving the nervous system (R29.898);Other symptoms and signs involving cognitive function;Pain   Activity Tolerance Patient tolerated treatment well;Patient limited by pain    Patient Left in chair;with call bell/phone within reach;with chair alarm set   Nurse Communication Mobility status        Time: 1287-8676 OT Time Calculation (min): 26 min  Charges: OT General Charges $OT Visit: 1 Visit OT Treatments $Self Care/Home Management : 8-22 mins $Therapeutic Activity: 8-22 mins  Lodema Hong, [place]  Pager [phone] Office [phone]   Trixie Dredge 06/08/2021, 11:24 AM

## 2021-06-08 NOTE — TOC Initial Note (Signed)
Transition of Care [facility]) - Initial/Assessment Note    Patient Details  Name: Kathy Scott MRN: [id number] Date of Birth: 1950/11/03  Transition [facility] CM/SW Contact:    Cyndi Bender, RN Phone Number: 06/08/2021, 1:51 PM  Clinical Narrative:                 Spoke to patient regarding transition needs. Patient states she lives alone and did drive herself. She stated the MD just told her she can't drive for 6 mths. She said her friends or cousins could help her with transportation. She has a RW at home.  TOC will continue to follow for discharge needs.  Address, Phone number and PCP verified.   Expected Discharge Plan: [place] Barriers to Discharge: Continued Medical Work up   Patient Goals and CMS Choice        Expected Discharge Plan and Services Expected Discharge Plan: [place]       Living arrangements for the past 2 months: Single Family Home                                      Prior Living Arrangements/Services Living arrangements for the past 2 months: Single Family Home Lives with:: Self          Need for Family Participation in Patient Care: Yes (Comment) Care giver support system in place?: Yes (comment)   Criminal Activity/Legal Involvement Pertinent to Current Situation/Hospitalization: No - Comment as needed  Activities of Daily Living Home Assistive Devices/Equipment: Eyeglasses, Blood pressure cuff, Bedside commode/3-in-1, Walker (specify type), Shower chair with back, Crutches ADL Screening (condition at time of admission) Patient's cognitive ability adequate to safely complete daily activities?: Yes Is the patient deaf or have difficulty hearing?: No Does the patient have difficulty seeing, even when wearing glasses/contacts?: No Does the patient have difficulty concentrating, remembering, or making decisions?: No Patient able to express need for assistance with ADLs?: Yes Does the  patient have difficulty dressing or bathing?: No Independently performs ADLs?: Yes (appropriate for developmental age) Does the patient have difficulty walking or climbing stairs?: Yes (left ankle surgery) Weakness of Legs: Left Weakness of Arms/Hands: Right  Permission Sought/Granted                  Emotional Assessment Appearance:: Appears stated age Attitude/Demeanor/Rapport: Engaged Affect (typically observed): Accepting Orientation: : Oriented to Self, Oriented to Place, Oriented to  Time, Oriented to Situation Alcohol / Substance Use: Not Applicable Psych Involvement: No (comment)  Admission diagnosis:  Hypokalemia [E87.6] Seizure ([place]) [R56.9] Pain [R52] Thoracic back pain [M54.6] Altered mental status, unspecified altered mental status type [R41.82] AMS (altered mental status) [R41.82] Patient Active Problem List   Diagnosis Date Noted   Postictal confusion 06/07/2021   Acidosis, lactic 74/94/4967   Metabolic acidosis with increased anion gap and accumulation of organic acids 06/07/2021   Leukocytosis 06/07/2021   Thrombocytosis 06/07/2021   Hypokalemia 06/07/2021   Open wound of tongue due to bite 06/07/2021   Thoracic back pain 06/07/2021   Meningioma ([place]) 06/07/2021   Hypothyroidism, adult 06/19/2018   Partial symptomatic epilepsy with complex partial seizures, not intractable, without status epilepticus ([place]) 09/01/2014   Seizure disorder ([place]) 12/23/2012   Osteoporosis 12/23/2012   PCP:  Harlan Stains, MD Pharmacy:   [facility] ([place] [place] [address] Phone: [phone] Fax: [phone]     Social Determinants of Health (SDOH) Interventions    Readmission Risk Interventions No flowsheet data found.

## 2021-06-08 NOTE — Progress Notes (Signed)
PROGRESS NOTE  Kathy Scott NWG:[id number] DOB: 1950-09-15   PCP: Harlan Stains, MD  Patient is from: Home.  Lives alone.  DOA: 06/06/2021 LOS: 0  Chief complaints:  Chief Complaint  Patient presents with   Seizures     Brief Narrative / Interim history: 70 year old F with PMH of seizure disorder, HTN, hypothyroidism and colon cancer s/p resection presenting with witnessed tonic-clonic seizure by EMS, and admitted for breakthrough seizure.  CTH and MRI brain without acute finding other than 7 mm meningioma.  WBC 23.4.  Lactic acid 9.0>> 1.1.  CXR consistent with CHF.  K2.3.  Admitting provider discussed case with on-call neurologist who recommended increasing home Keppra to 1250 mg twice daily.  Patient had respiratory distress with oxygen requirement.  Started on IV Lasix.  TTE ordered.  Subjective: Seen and examined earlier this morning.  No major events overnight of this morning.  Reports improvement in her breathing but not quite back to her baseline.  She denies cough, orthopnea or PND.   Objective: Vitals:   06/08/21 0003 06/08/21 0400 06/08/21 0734 06/08/21 1254  BP: (!) 152/72 (!) 151/68 139/64 (!) 144/68  Pulse: 96 75 83 92  Resp: _0 Temp:  99.1 F (37.3 C) 99.8 F (37.7 C)   TempSrc:  Oral Oral Oral  SpO2: 94% 95% 96% 91%  Weight:      Height:        Intake/Output Summary (Last 24 hours) at 06/08/2021 1351 Last data filed at 06/08/2021 0900 Gross per 24 hour  Intake 120 ml  Output 175 ml  Net -55 ml   Filed Weights   06/07/21 1936 06/07/21 2129  Weight: 69.1 kg 70.2 kg    Examination:  GENERAL: No apparent distress.  Nontoxic. HEENT: MMM.  Vision and hearing grossly intact.  NECK: Supple.  No apparent JVD.  RESP: 96% on 2 L.  No IWOB.  Bilateral rales. CVS:  RRR. Heart sounds normal.  ABD/GI/GU: BS+. Abd soft, NTND.  MSK/EXT:  Moves extremities. No apparent deformity.  Trace edema in BLE. SKIN: no apparent skin lesion or  wound NEURO: Awake, alert and oriented appropriately.  No apparent focal neuro deficit. PSYCH: Calm. Normal affect.   Procedures:  None  Microbiology summarized: [id number] and influenza PCR nonreactive.  Assessment & Plan: Acute respiratory failure with hypoxia-concern for acute CHF provoked by IV fluid hydration.  Desaturated to 85% requiring supplemental oxygen.  BNP within normal but CXR with vascular congestion concerning for CHF.  Symptoms improved with IV Lasix but he desaturated to 86% with ambulation on RA.  Patient is not on oxygen at home.  I&O was not captured well. -Continue IV Lasix 40 mg daily -Strict I&O, daily weight, renal functions and electrolytes -Follow echocardiogram -Sodium and fluid restriction.  Breakthrough seizure: had a witnessed tonic-clonic seizure by EMS.  Heart tongue bite as well.  Elevated CK and lactic acid goes along with this.  CT head and MRI brain without significant finding.  She is on Keppra Exar 1000 mg daily at home. Last seizure was about 5 years prior.  Not sure why she has breakthrough seizure at this time.  -Continue Keppra 1250 mg twice daily per recommendation by neurology. -Continue seizure precautions -No driving for 6 months per [place] law.    Elevated anion gap metabolic acidosis/lactic acidosis: Resolved.   Leukocytosis/bandemia/thrombocytosis: Likely demargination from seizure episode.  Improved without antibiotics.  Infectious work-up negative.  Hypokalemia: Resolved.  Essential hypertension: SBP slightly  elevated. -Continue home valsartan, metoprolol and amlodipine. -IV Lasix as above.   Age-indeterminate thoracic compression fracture/back pain- Lumbar vertebral osteopenia Femoral osteoporosis -Pain control -Follow MRI thoracic spine -May consider discontinuing prednisone-not sure about the indication -Check vitamin D level   Mildly elevated AST: Likely due to mild rhabdo from seizure.  Resolved.  Mild rhabdomyolysis:  Likely due to seizure activity.  Improving. -Recheck CK in the morning   Prolonged QTc: 498.  Likely due to electrolyte derangement. -Recheck EKG   Hypothyroidism: TSH within normal. -Continue levothyroxine   Brain mass: MRI showed 7 mm presumed falcine meningioma.   Physical deconditioning: Patient lives alone.  Independent at baseline. -PT/OT   Class I obesity Body mass index is 32.35 kg/m.         DVT prophylaxis:  enoxaparin (LOVENOX) injection 40 mg Start: 06/07/21 1000  Code Status: Full code Family Communication: Patient and/or RN. Available if any question.  Level of care: Telemetry Medical Status is: Observation  The patient will require care spanning > 2 midnights and should be moved to inpatient because: Acute respiratory failure presumed to be due to acute CHF requiring IV Lasix, and physical deconditioning requiring safe disposition.      Consultants:  Neurology over the phone   Sch Meds:  Scheduled Meds:  allopurinol  300 mg Oral Daily   amLODipine  10 mg Oral Daily   chlorhexidine gluconate (MEDLINE KIT)  15 mL Mouth Rinse BID   enoxaparin (LOVENOX) injection  40 mg Subcutaneous Daily   irbesartan  300 mg Oral Daily   levETIRAcetam  1,250 mg Oral BID   levothyroxine  50 mcg Oral QAC breakfast   lidocaine  1 patch Transdermal Q24H   magic mouthwash w/lidocaine  2 mL Oral TID   magnesium oxide  400 mg Oral Daily   mouth rinse  15 mL Mouth Rinse 10 times per day   metoprolol succinate  50 mg Oral Daily   pantoprazole  40 mg Oral Daily   predniSONE  5 mg Oral Q breakfast   Continuous Infusions: PRN Meds:.acetaminophen **OR** acetaminophen, albuterol, HYDROcodone-acetaminophen, LORazepam, LORazepam, ondansetron **OR** ondansetron (ZOFRAN) IV  Antimicrobials: Anti-infectives (From admission, onward)    None        I have personally reviewed the following labs and images: CBC: Recent Labs  Lab 06/06/21 1217 06/07/21 1300  WBC 23.4*  11.4*  NEUTROABS 20.3* 8.7*  HGB 13.7 12.8  HCT 40.7 39.2  MCV 94.2 96.1  PLT 432* 346   BMP &GFR Recent Labs  Lab 06/06/21 1217 06/07/21 0822 06/08/21 0128  NA 140 142 139  K 2.3* 3.7 3.7  CL 106 108 108  CO2 15* 20* 22  GLUCOSE 237* 98 110*  BUN _0 CREATININE 1.19* 0.84 0.99  CALCIUM 9.5 9.3 9.2  MG  --  2.0  --    Estimated Creatinine Clearance: 43.3 mL/min (by C-G formula based on SCr of 0.99 mg/dL). Liver & Pancreas: Recent Labs  Lab 06/06/21 1217 06/08/21 0128  AST 42* 25  ALT 27 18  ALKPHOS 116 85  BILITOT 0.5 1.0  PROT 8.0 7.2  ALBUMIN 3.8 3.4*   No results for input(s): LIPASE, AMYLASE in the last 168 hours. Recent Labs  Lab 06/06/21 1225  AMMONIA 36*   Diabetic: No results for input(s): HGBA1C in the last 72 hours. Recent Labs  Lab 06/06/21 1222  GLUCAP 203*   Cardiac Enzymes: Recent Labs  Lab 06/07/21 0822 06/08/21 0128  CKTOTAL 855*  551*   No results for input(s): PROBNP in the last 8760 hours. Coagulation Profile: No results for input(s): INR, PROTIME in the last 168 hours. Thyroid Function Tests: Recent Labs    06/07/21 1300  TSH 1.137   Lipid Profile: No results for input(s): CHOL, HDL, LDLCALC, TRIG, CHOLHDL, LDLDIRECT in the last 72 hours. Anemia Panel: No results for input(s): VITAMINB12, FOLATE, FERRITIN, TIBC, IRON, RETICCTPCT in the last 72 hours. Urine analysis:    Component Value Date/Time   COLORURINE YELLOW 06/06/2021 1944   APPEARANCEUR HAZY (A) 06/06/2021 1944   LABSPEC 1.019 06/06/2021 1944   PHURINE 5.0 06/06/2021 1944   GLUCOSEU NEGATIVE 06/06/2021 1944   HGBUR MODERATE (A) 06/06/2021 1944   BILIRUBINUR NEGATIVE 06/06/2021 1944   KETONESUR 5 (A) 06/06/2021 1944   PROTEINUR 100 (A) 06/06/2021 1944   NITRITE NEGATIVE 06/06/2021 1944   LEUKOCYTESUR NEGATIVE 06/06/2021 1944   Sepsis Labs: Invalid input(s): PROCALCITONIN, [place]  Microbiology: Recent Results (from the past 240 hour(s))  Resp  Panel by RT-PCR (Flu A&B, Covid) Nasopharyngeal Swab     Status: None   Collection Time: 06/07/21  7:40 PM   Specimen: Nasopharyngeal Swab; Nasopharyngeal(NP) swabs in vial transport medium  Result Value Ref Range Status   SARS Coronavirus 2 by RT PCR NEGATIVE NEGATIVE Final    Comment: (NOTE) SARS-CoV-2 target nucleic acids are NOT DETECTED.  The SARS-CoV-2 RNA is generally detectable in upper respiratory specimens during the acute phase of infection. The lowest concentration of SARS-CoV-2 viral copies this assay can detect is 138 copies/mL. A negative result does not preclude SARS-Cov-2 infection and should not be used as the sole basis for treatment or other patient management decisions. A negative result may occur with  improper specimen collection/handling, submission of specimen other than nasopharyngeal swab, presence of viral mutation(s) within the areas targeted by this assay, and inadequate number of viral copies(<138 copies/mL). A negative result must be combined with clinical observations, patient history, and epidemiological information. The expected result is Negative.  Fact Sheet for Patients:  EntrepreneurPulse.com.au  Fact Sheet for Healthcare Providers:  IncredibleEmployment.be  This test is no t yet approved or cleared by the [place] FDA and  has been authorized for detection and/or diagnosis of SARS-CoV-2 by FDA under an Emergency Use Authorization (EUA). This EUA will remain  in effect (meaning this test can be used) for the duration of the COVID-19 declaration under Section 564(b)(1) of the Act, 21 U.S.C.section 360bbb-3(b)(1), unless the authorization is terminated  or revoked sooner.       Influenza A by PCR NEGATIVE NEGATIVE Final   Influenza B by PCR NEGATIVE NEGATIVE Final    Comment: (NOTE) The Xpert Xpress SARS-CoV-2/FLU/RSV plus assay is intended as an aid in the diagnosis of influenza from Nasopharyngeal  swab specimens and should not be used as a sole basis for treatment. Nasal washings and aspirates are unacceptable for Xpert Xpress SARS-CoV-2/FLU/RSV testing.  Fact Sheet for Patients: EntrepreneurPulse.com.au  Fact Sheet for Healthcare Providers: IncredibleEmployment.be  This test is not yet approved or cleared by the [place] FDA and has been authorized for detection and/or diagnosis of SARS-CoV-2 by FDA under an Emergency Use Authorization (EUA). This EUA will remain in effect (meaning this test can be used) for the duration of the COVID-19 declaration under Section 564(b)(1) of the Act, 21 U.S.C. section 360bbb-3(b)(1), unless the authorization is terminated or revoked.  Performed at [facility], [place] [address] [place], [place] [address]  Radiology Studies: MR THORACIC SPINE WO CONTRAST  Result Date: 06/08/2021 EXAM: MRI THORACIC SPINE WITHOUT CONTRAST TECHNIQUE: Multiplanar, multisequence MR imaging of the thoracic spine was performed. No intravenous contrast was administered. COMPARISON:  None. FINDINGS: Alignment: No substantial sagittal subluxation. Mildly exaggerated thoracic kyphosis. Vertebrae: Superior endplate fractures at T3, T5, T6, T8, T11, T12, and L1. Associated marrow edema, compatible with acute/recent fractures. Subtle fracture lines at multiple levels. Approximately 30% height loss at T5 and T6. Approximately 20% height loss at T8 and T3. Slight height loss at T11, T12, and L1. No significant bony retropulsion. There is subtle T2/stir hypointense linear signal within the ventral canal in the upper to midthoracic spine on sagittal sequences which is not confirmed on axial sequences and therefore favored to reflect artifactual CSF pulsation artifact. Cord:  Normal cord signal. Paraspinal and other soft tissues: Probable small right pleural effusion with suspect dependent lung opacities, poorly evaluated by MRI. Disc  levels: Small disc bulges at multiple levels without significant canal stenosis. Multilevel facet arthropathy w with moderate foraminal stenosis on the right at T5-T6, T6-T7, T7-T8, and T8-T9. Otherwise, multilevel mild foraminal stenosis. IMPRESSION: 1. Acute/recent superior endplate fractures at T3, T5, T6, T8, T11, T12, and L1. Up to 30% height loss, as detailed above. No significant bony retropulsion. 2. Moderate foraminal stenosis on the right at T5-T6, T6-T7, T7-T8, and T8-T9. Otherwise, multilevel mild foraminal stenosis. No significant canal stenosis. 3. Probable small right pleural effusion with suspect dependent lung opacities, poorly evaluated by MRI. Electronically Signed   By: Margaretha Sheffield M.D.   On: 06/08/2021 11:49      Kynesha Guerin T. Langl[place]  If 7PM-7AM, please contact night-coverage [email or web address] 06/08/2021, 1:51 PM

## 2021-06-09 DIAGNOSIS — S22008A Other fracture of unspecified thoracic vertebra, initial encounter for closed fracture: Secondary | ICD-10-CM

## 2021-06-09 DIAGNOSIS — G40909 Epilepsy, unspecified, not intractable, without status epilepticus: Secondary | ICD-10-CM

## 2021-06-09 DIAGNOSIS — I5033 Acute on chronic diastolic (congestive) heart failure: Secondary | ICD-10-CM

## 2021-06-09 DIAGNOSIS — K219 Gastro-esophageal reflux disease without esophagitis: Secondary | ICD-10-CM | POA: Insufficient documentation

## 2021-06-09 DIAGNOSIS — S32009A Unspecified fracture of unspecified lumbar vertebra, initial encounter for closed fracture: Secondary | ICD-10-CM

## 2021-06-09 LAB — CBC
HCT: 32.7 % — ABNORMAL LOW (ref 36.0–46.0)
Hemoglobin: 11.1 g/dL — ABNORMAL LOW (ref 12.0–15.0)
MCH: 32.2 pg (ref 26.0–34.0)
MCHC: 33.9 g/dL (ref 30.0–36.0)
MCV: 94.8 fL (ref 80.0–100.0)
Platelets: 293 10*3/uL (ref 150–400)
RBC: 3.45 MIL/uL — ABNORMAL LOW (ref 3.87–5.11)
RDW: 13.8 % (ref 11.5–15.5)
WBC: 9.5 10*3/uL (ref 4.0–10.5)
nRBC: 0 % (ref 0.0–0.2)

## 2021-06-09 LAB — RENAL FUNCTION PANEL
Albumin: 3 g/dL — ABNORMAL LOW (ref 3.5–5.0)
Anion gap: 11 (ref 5–15)
BUN: 18 mg/dL (ref 8–23)
CO2: 20 mmol/L — ABNORMAL LOW (ref 22–32)
Calcium: 9.6 mg/dL (ref 8.9–10.3)
Chloride: 107 mmol/L (ref 98–111)
Creatinine, Ser: 1 mg/dL (ref 0.44–1.00)
GFR, Estimated: >60 mL/min (ref >60)
Glucose, Bld: 98 mg/dL (ref 70–99)
Phosphorus: 4.2 mg/dL (ref 2.5–4.6)
Potassium: 3.5 mmol/L (ref 3.5–5.1)
Sodium: 138 mmol/L (ref 135–145)

## 2021-06-09 LAB — MAGNESIUM: Magnesium: 1.8 mg/dL (ref 1.7–2.4)

## 2021-06-09 LAB — CK: Total CK: 316 U/L — ABNORMAL HIGH (ref 38–234)

## 2021-06-09 MED ORDER — FUROSEMIDE 40 MG PO TABS: 40.0000 mg | ORAL_TABLET | Freq: Every day | ORAL | 1 refills | Status: DC | PRN

## 2021-06-09 MED ORDER — LEVETIRACETAM ER 1000 MG PO TB24: 2000.0000 mg | ORAL_TABLET | Freq: Every day | ORAL | 1 refills | Status: DC

## 2021-06-09 NOTE — Plan of Care (Signed)

## 2021-06-09 NOTE — Progress Notes (Signed)
Patient Saturations on Room Air at Rest = 90% Patient Saturations on [employer] while Ambulating = 86% Patient Saturations on 2 Liters of oxygen while Ambulating = 92% Please briefly explain why patient needs home oxygen: Patient needs to ambulate to safely to bathroom in home and to bed

## 2021-06-09 NOTE — Progress Notes (Signed)
Patient ambulated 10 feet, O2 staturation dropped to 86% on room air placed on 2liters nasal cannula and O2 went to 92, sat in chair on room air stating 90%

## 2021-06-09 NOTE — TOC Transition Note (Addendum)
Transition of Care [facility]) - CM/SW Discharge Note   Patient Details  Name: Kathy Scott MRN: [id number] Date of Birth: [birth year]  [facility] of Care [facility] CM/SW Contact:  Verdell Carmine, RN Phone Number: 06/09/2021, 12:54 PM   Clinical Narrative:     Patient will be DC on home oxygen.  Called patient to discuss DC planning. Discussed potential that this RNCM will not be able to find [facility] services and the next step would be contacting PCP for assistance.  Called [facility], [place], [place]- cannot take patient Awaiting [facility]. . Patient will be DC home, has transportation.  With oxygen  Called [facility]- cannot take declined.   Final next level of care: [place] Barriers to Discharge:  (cannot find home health agency)   Patient Goals and CMS Choice        Discharge Placement             Home with oxygen, unable to obtain [facility] services at this time          Discharge Plan and Services                DME Arranged: Brace, Leg, Oxygen DME Agency: [employer] Date DME Agency Contacted: 06/09/21 Time DME Agency Contacted: 1100 Representative spoke with at DME Agency: Freda Munro HH Arranged: PT, OT, Nurse's Aide          Social Determinants of Health ([place]) Interventions     Readmission Risk Interventions No flowsheet data found.

## 2021-06-09 NOTE — Progress Notes (Signed)
Patient 90% O2 on room air sitting up in chair in room

## 2021-06-09 NOTE — Discharge Summary (Signed)
Physician Discharge Summary  Kathy Scott DGU:[id number] DOB: 15-Nov-1950 DOA: 06/06/2021  PCP: Harlan Stains, MD  Admit date: 06/06/2021 Discharge date: 06/09/2021 Admitted From: Home Disposition: Home Recommendations for Outpatient Follow-up:  Follow ups as below. Patient to follow-up with neurology in 1 to 2 weeks. Patient advised not to drive for 6 months per [place] law Please obtain CBC/BMP/Mag at follow up Please follow up on the following pending results: None Home Health: PT/OT/aide Equipment/Devices: Home oxygen Discharge Condition: Stable CODE STATUS: Full code  Follow-up Information     Harlan Stains, MD. Schedule an appointment as soon as possible for a visit in 1 week(s).   Specialty: Family Medicine Contact information: [address], [place] [address] [phone]                Hospital Course: 70 year old F with PMH of seizure disorder, HTN, hypothyroidism and colon cancer s/p resection presenting with witnessed tonic-clonic seizure by EMS, and admitted for breakthrough seizure.  CTH and MRI brain without acute finding other than 7 mm meningioma.  WBC 23.4.  Lactic acid 9.0>> 1.1.  CXR consistent with CHF.  K2.3.  Admitting provider discussed case with on-call neurologist who recommended increasing home Keppra to 1250 mg twice daily.  Patient remained seizure-free during this hospitalization.  Neurology, Dr. Quinn Axe recommended increasing home Keppra ER to 2000 mg daily on discharge.  Encouraged to follow-up with a neurologist in 1 to 2 weeks.  Advised not to drive for the next 6 months per [place] law.  Of note, patient had acute/recent superior endplate fractures at T3, 5, 6, 8, 11 and 12 and L1 with up to 30% height loss but no significant bony retropulsion.  She also had moderate foraminal stenosis on the right at T5-6, T6-7, T7-8 and T8-9.  She has no focal neurodeficit other than some back pain.  Patient had respiratory  distress with oxygen requirement.  Started on IV Lasix.  Echocardiogram with LVEF of 60 to 65%, moderate asymmetric LVH of the basal septal segment and indeterminate DD.  Patient remained seizure-free during this hospitalization.  Respiratory failure improved but desaturated to 86% with ambulation requiring 2 L.  She is discharged on 2 L by nasal cannula until follow-up and evaluation by PCP.  Patient to continue p.o. Lasix intermittently as needed.  Therapy recommended SNF but patient preferred to go home with home health.  She says she has family to come and stay with her.  See individual problem list below for more on hospital course.  Discharge Diagnoses:  Acute respiratory failure with hypoxia-concern for acute diastolic CHF provoked by IV fluid hydration.  Desaturated to 85% requiring supplemental oxygen.  BNP within normal but CXR with vascular congestion concerning for CHF.  Symptoms improved with IV Lasix but he desaturated to 86% with ambulation on RA requiring 2 L to recover.  Echocardiogram as above. -Discharged on p.o. Lasix 40 mg as needed -Counseled on sodium and fluid restrictions   Breakthrough seizure: had a witnessed tonic-clonic seizure by EMS.  Heart tongue bite as well.  Elevated CK and lactic acid goes along with this.  CT head and MRI brain without significant finding.  She is on Keppra ER1000 mg daily at home. Last seizure was about 5 years prior.  Not sure why she has breakthrough seizure at this time.  Has not had further seizure in the hospital. -Discharged on Keppra ER 2000 mg daily per neurology. -No driving for 6 months per  [place] law.    Elevated anion gap metabolic acidosis/lactic acidosis: Resolved.   Leukocytosis/bandemia/thrombocytosis: Likely demargination from seizure episode.  Resolved.   Hypokalemia: Resolved.   Essential hypertension: SBP slightly elevated. -Continue home valsartan, metoprolol and amlodipine. -As needed Lasix as above.   Acute  thoracic and lumbar fractures-MRI thoracic spine as above Lumbar vertebral osteopenia Femoral osteoporosis -Pain control with Tylenol -Continue calcium and vitamin D supplementation   Mildly elevated AST: Likely due to mild rhabdo from seizure.  Resolved.   Mild rhabdomyolysis: Likely due to seizure activity.  Improved.   Prolonged QTc: 498.  Likely due to electrolyte derangement.   Hypothyroidism: TSH within normal. -Continue levothyroxine   Brain mass: MRI showed 7 mm presumed falcine meningioma.    Physical deconditioning: Patient lives alone.  Independent at baseline. -Therapy recommended SNF but patient prefers to go home with home health.   Class I obesity Body mass index is 32.35 kg/m.           Discharge Exam: Vitals:   06/09/21 0303 06/09/21 0400 06/09/21 0457 06/09/21 0733  BP: 139/62 (!) 126/55 (!) 143/62 (!) 143/64  Pulse: 71 66 70 73  Temp: 98.2 F (36.8 C) 99 F (37.2 C)  98.8 F (37.1 C)  Resp: 18  19 16   Height:      Weight:      SpO2: 95% 93% 94% 94%  TempSrc: Oral Oral  Oral  BMI (Calculated):         GENERAL: No apparent distress.  Nontoxic. HEENT: MMM.  Vision and hearing grossly intact.  NECK: Supple.  No apparent JVD.  RESP: 98% on RA at rest.  No IWOB.  Fair aeration bilaterally. CVS:  RRR. Heart sounds normal.  ABD/GI/GU: Bowel sounds present. Soft. Non tender.  MSK/EXT:  Moves extremities. No apparent deformity. No edema.  SKIN: no apparent skin lesion or wound NEURO: Awake and alert.  Oriented appropriately.  No apparent focal neuro deficit. PSYCH: Calm. Normal affect.   Discharge Instructions  Discharge Instructions     (HEART FAILURE PATIENTS) Call MD:  Anytime you have any of the following symptoms: 1) 3 pound weight gain in 24 hours or 5 pounds in 1 week 2) shortness of breath, with or without a dry hacking cough 3) swelling in the hands, feet or stomach 4) if you have to sleep on extra pillows at night in order to breathe.    Complete by: As directed    Call MD for:  difficulty breathing, headache or visual disturbances   Complete by: As directed    Call MD for:  extreme fatigue   Complete by: As directed    Call MD for:  persistant dizziness or light-headedness   Complete by: As directed    Diet - low sodium heart healthy   Complete by: As directed    Discharge instructions   Complete by: As directed    It has been a pleasure taking care of you!  You were hospitalized due to breakthrough seizure.  We have increased your Keppra to 2000 mg daily.  You also had vertebral fractures. Luckily, this didn't affect your spinal cord or your nerves in the back.  You may continue taking 1000 mg Tylenol 3 times a day over the next 5 days then as needed.   Follow-up with your neurologist in 1 to 2 weeks or sooner if needed.  DO not drive for 6 months/until cleared by physician   Per [facility] statutes, patients with  seizures are not allowed to drive until they have been seizure-free for six months. Patient acknowledged,repeated, and understands.    Use caution when using heavy equipment or power tools. Avoid working on ladders or at heights. Take showers instead of baths. Ensure the water temperature is not too high on the home water heater. Do not go swimming alone. Do not lock yourself in a room alone (i.e. bathroom). When caring for infants or small children, sit down when holding, feeding, or changing them to minimize risk of injury to the child in the event you have a seizure. Maintain good sleep hygiene. Avoid alcohol.    If patient has another seizure, call 911 and bring them back to the ED if: A.  Any seizure-like activity or alteration of mentation or LOC - especially if  > 5 mins.      B.  The patient doesn't wake shortly after the seizure or has new problems such as difficulty seeing, speaking or moving following the seizure C.  The patient was injured during the seizure D.  The patient has a  temperature over 102 F (39C) E.  The patient vomited during the seizure and now is having trouble breathing   During the Seizure   - First, ensure adequate ventilation and place patients on the floor on their left side  Loosen clothing around the neck and ensure the airway is patent. If the patient is clenching the teeth, do not force the mouth open with any object as this can cause severe damage - Remove all items from the surrounding that can be hazardous. The patient may be oblivious to what's happening and may not even know what he or she is doing. If the patient is confused and wandering, either gently guide him/her away and block access to outside areas - Reassure the individual and be comforting - Call 911. In most cases, the seizure ends before EMS arrives. However, there are cases when seizures may last over 3 to 5 minutes. Or the individual may have developed breathing difficulties or severe injuries. If a pregnant patient or a person with diabetes develops a seizure, it is prudent to call an ambulance. - Finally, if the patient does not regain full consciousness, then call EMS. Most patients will remain confused for about 45 to 90 minutes after a seizure, so you must use judgment in calling for help. - Avoid restraints but make sure the patient is in a bed with padded side rails - Place the individual in a lateral position with the neck slightly flexed; this will help the saliva drain from the mouth and prevent the tongue from falling backward - Remove all nearby furniture and other hazards from the area - Provide verbal assurance as the individual is regaining consciousness - Provide the patient with privacy if possible - Call for help and start treatment as ordered by the caregiver    After the Seizure (Postictal Stage)   After a seizure, most patients experience confusion, fatigue, muscle pain and/or a headache. Thus, one should permit the individual to sleep. For the next few  days, reassurance is essential. Being calm and helping reorient the person is also of importance.   Most seizures are painless and end spontaneously. Seizures are not harmful to others but can lead to complications such as stress on the lungs, brain and the heart. Individuals with prior lung problems may develop labored breathing and respiratory distress.      Take care,   Increase activity slowly  Complete by: As directed       Allergies as of 06/09/2021       Reactions   Ibandronic Acid Other (See Comments)   Other Rash   Adhesive badages   Lisinopril Cough   Raloxifene Other (See Comments)   Joint pain        Medication List     TAKE these medications    allopurinol 300 MG tablet Commonly known as: ZYLOPRIM Take 300 mg by mouth daily.   amLODipine 10 MG tablet Commonly known as: NORVASC Take 10 mg by mouth daily.   calcium-vitamin D 500-200 MG-UNIT tablet Commonly known as: OSCAL WITH D Take 2 tablets by mouth.   furosemide 40 MG tablet Commonly known as: Lasix Take 1 tablet (40 mg total) by mouth daily as needed for fluid or edema (shortness of breath).   levETIRAcetam ER 1000 MG Tb24 Take 2,000 mg by mouth at bedtime. Start taking on: June 10, 2021 What changed:  medication strength how much to take when to take this   Magnesium 500 MG Caps Take 500 mg by mouth daily.   metoprolol succinate 50 MG 24 hr tablet Commonly known as: TOPROL-XL Take 50 mg by mouth daily.   MULTIVITAMIN ADULT PO Take 1 tablet by mouth daily.   omeprazole 40 MG capsule Commonly known as: PRILOSEC Take 40 mg by mouth daily.   predniSONE 5 MG tablet Commonly known as: DELTASONE Take 5 mg by mouth daily with breakfast.   Synthroid 50 MCG tablet Generic drug: levothyroxine Take 50 mcg by mouth daily.   valsartan 320 MG tablet Commonly known as: DIOVAN Take 320 mg by mouth daily.   vitamin B-12 500 MCG tablet Commonly known as: CYANOCOBALAMIN Take 500 mcg  by mouth daily.   Vitamin D3 25 MCG (1000 UT) Caps Take 1,000 Units by mouth daily.               Durable Medical Equipment  (From admission, onward)           Start     Ordered   06/09/21 1134  For home use only DME oxygen  Once       Question Answer Comment  Length of Need 6 Months   Mode or (Route) Nasal cannula   Liters per Minute 2   Frequency Continuous (stationary and portable oxygen unit needed)   Oxygen delivery system Gas      06/09/21 1134            Consultations: Neurology over the phone  Procedures/Studies:   DG Thoracic Spine 2 View  Result Date: 06/07/2021 CLINICAL DATA:  Mid back pain. EXAM: THORACIC SPINE 2 VIEWS COMPARISON:  Nov 27, 2007. FINDINGS: Mild compression deformity of least 2 midthoracic vertebral bodies is noted concerning for fractures of indeterminate age. No spondylolisthesis is noted. IMPRESSION: Mild compression deformity at least 2 midthoracic vertebral bodies is noted concerning for fractures of indeterminate age. MRI may be performed for further evaluation. Electronically Signed   By: Marijo Conception M.D.   On: 06/07/2021 12:41   DG Lumbar Spine Complete  Result Date: 06/07/2021 CLINICAL DATA:  Lower back pain. EXAM: LUMBAR SPINE - COMPLETE 4+ VIEW COMPARISON:  None. FINDINGS: There is no evidence of lumbar spine fracture. Alignment is normal. Mild degenerative disc disease is noted at L1-2, L2-3, L3-4 and L5-S1. IMPRESSION: Multilevel degenerative disc disease.  No acute abnormality seen. Electronically Signed   By: Marijo Conception M.D.   On: 06/07/2021  12:43   CT Head Wo Contrast  Result Date: 06/06/2021 CLINICAL DATA:  Mental status change, unknown cause EXAM: CT HEAD WITHOUT CONTRAST TECHNIQUE: Contiguous axial images were obtained from the base of the skull through the vertex without intravenous contrast. COMPARISON:  None. FINDINGS: Brain: There is no acute intracranial hemorrhage, mass effect, or edema. Gray-white  differentiation is preserved. There is no extra-axial fluid collection. Small subcentimeter mildly hyperdense lesion along the falx likely represents a meningioma. Ventricles and sulci are within normal limits in size and configuration. Vascular: There is atherosclerotic calcification at the skull base. Skull: Calvarium is unremarkable. Sinuses/Orbits: No acute finding. Other: None. IMPRESSION: No acute intracranial abnormality. Small subcentimeter falcine meningioma. Electronically Signed   By: Macy Mis M.D.   On: 06/06/2021 13:59   MR BRAIN WO CONTRAST  Result Date: 06/07/2021 CLINICAL DATA:  Altered mental status EXAM: MRI HEAD WITHOUT CONTRAST TECHNIQUE: Multiplanar, multiecho pulse sequences of the brain and surrounding structures were obtained without intravenous contrast. COMPARISON:  Head CT from yesterday FINDINGS: Brain: No acute infarction, hemorrhage, hydrocephalus, extra-axial collection or mass effect. 7 mm mass along the inferior margin of the mid falx. Age normal brain volume and white matter appearance Vascular: Preserved flow voids Skull and upper cervical spine: No focal marrow lesion. Cervical spine degeneration with anterolisthesis at C3-4. Sinuses/Orbits: Negative IMPRESSION: 1. No acute finding. 2. 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 3. Intermittent motion artifact. Electronically Signed   By: Jorje Guild M.D.   On: 06/07/2021 06:15   MR THORACIC SPINE WO CONTRAST  Result Date: 06/08/2021 EXAM: MRI THORACIC SPINE WITHOUT CONTRAST TECHNIQUE: Multiplanar, multisequence MR imaging of the thoracic spine was performed. No intravenous contrast was administered. COMPARISON:  None. FINDINGS: Alignment: No substantial sagittal subluxation. Mildly exaggerated thoracic kyphosis. Vertebrae: Superior endplate fractures at T3, T5, T6, T8, T11, T12, and L1. Associated marrow edema, compatible with acute/recent fractures. Subtle fracture lines at multiple levels. Approximately 30% height  loss at T5 and T6. Approximately 20% height loss at T8 and T3. Slight height loss at T11, T12, and L1. No significant bony retropulsion. There is subtle T2/stir hypointense linear signal within the ventral canal in the upper to midthoracic spine on sagittal sequences which is not confirmed on axial sequences and therefore favored to reflect artifactual CSF pulsation artifact. Cord:  Normal cord signal. Paraspinal and other soft tissues: Probable small right pleural effusion with suspect dependent lung opacities, poorly evaluated by MRI. Disc levels: Small disc bulges at multiple levels without significant canal stenosis. Multilevel facet arthropathy w with moderate foraminal stenosis on the right at T5-T6, T6-T7, T7-T8, and T8-T9. Otherwise, multilevel mild foraminal stenosis. IMPRESSION: 1. Acute/recent superior endplate fractures at T3, T5, T6, T8, T11, T12, and L1. Up to 30% height loss, as detailed above. No significant bony retropulsion. 2. Moderate foraminal stenosis on the right at T5-T6, T6-T7, T7-T8, and T8-T9. Otherwise, multilevel mild foraminal stenosis. No significant canal stenosis. 3. Probable small right pleural effusion with suspect dependent lung opacities, poorly evaluated by MRI. Electronically Signed   By: Margaretha Sheffield M.D.   On: 06/08/2021 11:49   DG Chest Port 1 View  Result Date: 06/06/2021 CLINICAL DATA:  Altered mental status EXAM: PORTABLE CHEST 1 VIEW COMPARISON:  11/27/2008 FINDINGS: Heart size within normal limits. Low lung volumes. Mild pulmonary vascular congestion with increased interstitial markings bilaterally. No pleural effusion or pneumothorax. IMPRESSION: Mild pulmonary vascular congestion with increased interstitial markings bilaterally, may reflect mild interstitial edema. Electronically Signed   By: Hart Carwin  Plundo D.O.   On: 06/06/2021 12:51   ECHOCARDIOGRAM COMPLETE  Result Date: 06/08/2021    ECHOCARDIOGRAM REPORT   Patient Name:   Kathy Scott  Date of Exam: 06/08/2021 Medical Rec #:  [id number]            Height:       58.0 in Accession #:    [id number]           Weight:       154.8 lb Date of Birth:  September 15, 1950            BSA:          1.633 m Patient Age:    1 years             BP:           139/64 mmHg Patient Gender: F                    HR:           79 bpm. Exam Location:  Inpatient Procedure: 2D Echo Indications:    Pulmonary edema  History:        Patient has no prior history of Echocardiogram examinations.  Sonographer:    Johny Chess RDCS Referring Phys: [phone] RONDELL A SMITH IMPRESSIONS  1. Left ventricular ejection fraction, by estimation, is 60 to 65%. The left ventricle has normal function. The left ventricle has no regional wall motion abnormalities. There is moderate asymmetric left ventricular hypertrophy of the basal-septal segment. Left ventricular diastolic parameters are indeterminate.  2. Right ventricular systolic function is normal. The right ventricular size is normal. Tricuspid regurgitation signal is inadequate for assessing PA pressure.  3. The mitral valve is normal in structure. Trivial mitral valve regurgitation. No evidence of mitral stenosis.  4. The aortic valve is tricuspid. Aortic valve regurgitation is not visualized. No aortic stenosis is present.  5. The inferior vena cava is normal in size with greater than 50% respiratory variability, suggesting right atrial pressure of 3 mmHg. FINDINGS  Left Ventricle: Left ventricular ejection fraction, by estimation, is 60 to 65%. The left ventricle has normal function. The left ventricle has no regional wall motion abnormalities. The left ventricular internal cavity size was normal in size. There is  moderate asymmetric left ventricular hypertrophy of the basal-septal segment. Left ventricular diastolic parameters are indeterminate. Right Ventricle: The right ventricular size is normal. No increase in right ventricular wall thickness. Right ventricular systolic function  is normal. Tricuspid regurgitation signal is inadequate for assessing PA pressure. Left Atrium: Left atrial size was normal in size. Right Atrium: Right atrial size was normal in size. Pericardium: There is no evidence of pericardial effusion. Mitral Valve: The mitral valve is normal in structure. Trivial mitral valve regurgitation. No evidence of mitral valve stenosis. Tricuspid Valve: The tricuspid valve is normal in structure. Tricuspid valve regurgitation is not demonstrated. Aortic Valve: The aortic valve is tricuspid. Aortic valve regurgitation is not visualized. No aortic stenosis is present. Pulmonic Valve: The pulmonic valve was not well visualized. Pulmonic valve regurgitation is mild. Aorta: The aortic root and ascending aorta are structurally normal, with no evidence of dilitation. Venous: The inferior vena cava is normal in size with greater than 50% respiratory variability, suggesting right atrial pressure of 3 mmHg. IAS/Shunts: The interatrial septum was not well visualized.  LEFT VENTRICLE PLAX 2D LVIDd:         4.50 cm   Diastology LVIDs:  3.00 cm   LV e' medial:    4.90 cm/s LV PW:         1.10 cm   LV E/e' medial:  15.5 LV IVS:        1.10 cm   LV e' lateral:   5.00 cm/s LVOT diam:     1.70 cm   LV E/e' lateral: 15.2 LV SV:         53 LV SV Index:   32 LVOT Area:     2.27 cm  RIGHT VENTRICLE             IVC RV S prime:     11.40 cm/s  IVC diam: 1.40 cm TAPSE (M-mode): 1.7 cm LEFT ATRIUM             Index        RIGHT ATRIUM           Index LA diam:        3.30 cm 2.02 cm/m   RA Area:     10.60 cm LA Vol (A2C):   28.0 ml 17.15 ml/m  RA Volume:   22.30 ml  13.66 ml/m LA Vol (A4C):   35.0 ml 21.43 ml/m LA Biplane Vol: 31.7 ml 19.41 ml/m  AORTIC VALVE             PULMONIC VALVE LVOT Vmax:   114.00 cm/s PR End Diast Vel: 9.73 msec LVOT Vmean:  74.300 cm/s LVOT VTI:    0.232 m  AORTA Ao Root diam: 3.00 cm Ao Asc diam:  2.70 cm MITRAL VALVE MV Area (PHT): 3.72 cm     SHUNTS MV Decel Time:  204 msec     Systemic VTI:  0.23 m MV E velocity: 76.00 cm/s   Systemic Diam: 1.70 cm MV A velocity: 109.00 cm/s MV E/A ratio:  0.70 Oswaldo Milian MD Electronically signed by Oswaldo Milian MD Signature Date/Time: 06/08/2021/5:48:34 PM    Final        The results of significant diagnostics from this hospitalization (including imaging, microbiology, ancillary and laboratory) are listed below for reference.     Microbiology: Recent Results (from the past 240 hour(s))  Resp Panel by RT-PCR (Flu A&B, Covid) Nasopharyngeal Swab     Status: None   Collection Time: 06/07/21  7:40 PM   Specimen: Nasopharyngeal Swab; Nasopharyngeal(NP) swabs in vial transport medium  Result Value Ref Range Status   SARS Coronavirus 2 by RT PCR NEGATIVE NEGATIVE Final    Comment: (NOTE) SARS-CoV-2 target nucleic acids are NOT DETECTED.  The SARS-CoV-2 RNA is generally detectable in upper respiratory specimens during the acute phase of infection. The lowest concentration of SARS-CoV-2 viral copies this assay can detect is 138 copies/mL. A negative result does not preclude SARS-Cov-2 infection and should not be used as the sole basis for treatment or other patient management decisions. A negative result may occur with  improper specimen collection/handling, submission of specimen other than nasopharyngeal swab, presence of viral mutation(s) within the areas targeted by this assay, and inadequate number of viral copies(<138 copies/mL). A negative result must be combined with clinical observations, patient history, and epidemiological information. The expected result is Negative.  Fact Sheet for Patients:  EntrepreneurPulse.com.au  Fact Sheet for Healthcare Providers:  IncredibleEmployment.be  This test is no t yet approved or cleared by the [place] FDA and  has been authorized for detection and/or diagnosis of SARS-CoV-2 by FDA under an Emergency Use  Authorization (EUA). This EUA will  remain  in effect (meaning this test can be used) for the duration of the COVID-19 declaration under Section 564(b)(1) of the Act, 21 U.S.C.section 360bbb-3(b)(1), unless the authorization is terminated  or revoked sooner.       Influenza A by PCR NEGATIVE NEGATIVE Final   Influenza B by PCR NEGATIVE NEGATIVE Final    Comment: (NOTE) The Xpert Xpress SARS-CoV-2/FLU/RSV plus assay is intended as an aid in the diagnosis of influenza from Nasopharyngeal swab specimens and should not be used as a sole basis for treatment. Nasal washings and aspirates are unacceptable for Xpert Xpress SARS-CoV-2/FLU/RSV testing.  Fact Sheet for Patients: EntrepreneurPulse.com.au  Fact Sheet for Healthcare Providers: IncredibleEmployment.be  This test is not yet approved or cleared by the [place] FDA and has been authorized for detection and/or diagnosis of SARS-CoV-2 by FDA under an Emergency Use Authorization (EUA). This EUA will remain in effect (meaning this test can be used) for the duration of the COVID-19 declaration under Section 564(b)(1) of the Act, 21 U.S.C. section 360bbb-3(b)(1), unless the authorization is terminated or revoked.  Performed at [facility], [place] [address] [place], [place] [address]      Labs:  CBC: Recent Labs  Lab 06/06/21 1217 06/07/21 1300 06/09/21 0200  WBC 23.4* 11.4* 9.5  NEUTROABS 20.3* 8.7*  --   HGB 13.7 12.8 11.1*  HCT 40.7 39.2 32.7*  MCV 94.2 96.1 94.8  PLT 432* 346 293   BMP &GFR Recent Labs  Lab 06/06/21 1217 06/07/21 0822 06/08/21 0128 06/09/21 0200  NA 140 142 139 138  K 2.3* 3.7 3.7 3.5  CL 106 108 108 107  CO2 15* 20* 22 20*  GLUCOSE 237* 98 110* 98  BUN 12 8 10 18   CREATININE 1.19* 0.84 0.99 1.00  CALCIUM 9.5 9.3 9.2 9.6  MG  --  2.0  --  1.8  PHOS  --   --   --  4.2   Estimated Creatinine Clearance: 42.8 mL/min (by C-G formula based on SCr  of 1 mg/dL). Liver & Pancreas: Recent Labs  Lab 06/06/21 1217 06/08/21 0128 06/09/21 0200  AST 42* 25  --   ALT 27 18  --   ALKPHOS 116 85  --   BILITOT 0.5 1.0  --   PROT 8.0 7.2  --   ALBUMIN 3.8 3.4* 3.0*   No results for input(s): LIPASE, AMYLASE in the last 168 hours. Recent Labs  Lab 06/06/21 1225  AMMONIA 36*   Diabetic: No results for input(s): HGBA1C in the last 72 hours. Recent Labs  Lab 06/06/21 1222  GLUCAP 203*   Cardiac Enzymes: Recent Labs  Lab 06/07/21 0822 06/08/21 0128 06/09/21 0200  CKTOTAL 855* 551* 316*   No results for input(s): PROBNP in the last 8760 hours. Coagulation Profile: No results for input(s): INR, PROTIME in the last 168 hours. Thyroid Function Tests: Recent Labs    06/07/21 1300  TSH 1.137   Lipid Profile: No results for input(s): CHOL, HDL, LDLCALC, TRIG, CHOLHDL, LDLDIRECT in the last 72 hours. Anemia Panel: No results for input(s): VITAMINB12, FOLATE, FERRITIN, TIBC, IRON, RETICCTPCT in the last 72 hours. Urine analysis:    Component Value Date/Time   COLORURINE YELLOW 06/06/2021 1944   APPEARANCEUR HAZY (A) 06/06/2021 1944   LABSPEC 1.019 06/06/2021 1944   PHURINE 5.0 06/06/2021 1944   GLUCOSEU NEGATIVE 06/06/2021 1944   HGBUR MODERATE (A) 06/06/2021 1944   BILIRUBINUR NEGATIVE 06/06/2021 1944   KETONESUR 5 (A) 06/06/2021  [place] (A) 06/06/2021 1944   NITRITE NEGATIVE 06/06/2021 1944   LEUKOCYTESUR NEGATIVE 06/06/2021 1944   Sepsis Labs: Invalid input(s): PROCALCITONIN, LACTICIDVEN   Time coordinating discharge: 55 minutes  SIGNED:  Mercy Riding, MD  [facility] 06/09/2021, 1:40 PM

## 2021-06-09 NOTE — Plan of Care (Signed)

## 2021-06-10 ENCOUNTER — Other Ambulatory Visit: Payer: Self-pay | Admitting: Student

## 2021-06-10 ENCOUNTER — Telehealth: Payer: Self-pay | Admitting: Surgery

## 2021-06-10 DIAGNOSIS — J9601 Acute respiratory failure with hypoxia: Secondary | ICD-10-CM | POA: Diagnosis not present

## 2021-06-10 NOTE — Progress Notes (Signed)
I received a phone call from CN on 5W, [name], concerning patient not receiving home oxygen concentrator. I called [facility] and  they stated they didn't have the order. I called Kennon Holter, RNCM and asked her to fax the order to [facility]. [facility] has verified that the concentrator has been delivered to the brother's house in [place]. I have left a message with patient's Brother, Kathy Scott, to call me if there is anything else they need.

## 2021-06-10 NOTE — Progress Notes (Signed)
Received PA request for  "Elepsia XR"  via e-mail although the original script was for LevETIRAcetam ER which is on formulary. No further action.

## 2021-06-10 NOTE — Telephone Encounter (Signed)
ED RNCM received call from [facility] concerning patient who was discharged home yesterday needing assistance with faxing orders to [facility]  for home oxygen set up.  ED RN faxed oxygen orders along  with oxygen qualifying documentation to [place] (provided by [employer]). As per Baron Sane ,she will follow up with patient and family.

## 2021-06-10 NOTE — Care Management (Signed)
Accepted by [facility] for Home Health PT/OT/Aide

## 2021-06-19 DIAGNOSIS — Z9981 Dependence on supplemental oxygen: Secondary | ICD-10-CM | POA: Diagnosis not present

## 2021-06-19 DIAGNOSIS — Z7952 Long term (current) use of systemic steroids: Secondary | ICD-10-CM | POA: Diagnosis not present

## 2021-06-19 DIAGNOSIS — E039 Hypothyroidism, unspecified: Secondary | ICD-10-CM | POA: Diagnosis not present

## 2021-06-19 DIAGNOSIS — Z9181 History of falling: Secondary | ICD-10-CM | POA: Diagnosis not present

## 2021-06-19 DIAGNOSIS — Z85038 Personal history of other malignant neoplasm of large intestine: Secondary | ICD-10-CM | POA: Diagnosis not present

## 2021-06-19 DIAGNOSIS — D320 Benign neoplasm of cerebral meninges: Secondary | ICD-10-CM | POA: Diagnosis not present

## 2021-06-19 DIAGNOSIS — I110 Hypertensive heart disease with heart failure: Secondary | ICD-10-CM | POA: Diagnosis not present

## 2021-06-19 DIAGNOSIS — Z87891 Personal history of nicotine dependence: Secondary | ICD-10-CM | POA: Diagnosis not present

## 2021-06-19 DIAGNOSIS — M8008XD Age-related osteoporosis with current pathological fracture, vertebra(e), subsequent encounter for fracture with routine healing: Secondary | ICD-10-CM | POA: Diagnosis not present

## 2021-06-19 DIAGNOSIS — I509 Heart failure, unspecified: Secondary | ICD-10-CM | POA: Diagnosis not present

## 2021-06-19 DIAGNOSIS — M4804 Spinal stenosis, thoracic region: Secondary | ICD-10-CM | POA: Diagnosis not present

## 2021-06-19 DIAGNOSIS — M8588 Other specified disorders of bone density and structure, other site: Secondary | ICD-10-CM | POA: Diagnosis not present

## 2021-06-20 ENCOUNTER — Telehealth: Payer: Self-pay | Admitting: Diagnostic Neuroimaging

## 2021-06-20 NOTE — Telephone Encounter (Signed)
Pt was called back, vm left asking pt to call if she is able to accept an appointment on either 12-07 or 12-19.  Out office # was left.

## 2021-06-20 NOTE — Telephone Encounter (Signed)
Pt is asking to be called if any other appointment is available to see Dr Leta Baptist before January.  The schedule for Amy, NP was checked which is further out that Dr Leta Baptist .  Pt feels the practice does not care if she has seizures because she was told she could not be switched to another provider, please call.

## 2021-06-22 ENCOUNTER — Encounter: Payer: Self-pay | Admitting: Diagnostic Neuroimaging

## 2021-06-22 ENCOUNTER — Telehealth: Payer: Self-pay | Admitting: Diagnostic Neuroimaging

## 2021-06-22 ENCOUNTER — Ambulatory Visit: Payer: Medicare Other | Admitting: Diagnostic Neuroimaging

## 2021-06-22 VITALS — BP 136/78 | HR 65 | Ht <= 58 in | Wt 148.5 lb

## 2021-06-22 DIAGNOSIS — Z9981 Dependence on supplemental oxygen: Secondary | ICD-10-CM | POA: Diagnosis not present

## 2021-06-22 DIAGNOSIS — Z7952 Long term (current) use of systemic steroids: Secondary | ICD-10-CM | POA: Diagnosis not present

## 2021-06-22 DIAGNOSIS — Z87891 Personal history of nicotine dependence: Secondary | ICD-10-CM | POA: Diagnosis not present

## 2021-06-22 DIAGNOSIS — Z79899 Other long term (current) drug therapy: Secondary | ICD-10-CM | POA: Diagnosis not present

## 2021-06-22 DIAGNOSIS — M4804 Spinal stenosis, thoracic region: Secondary | ICD-10-CM | POA: Diagnosis not present

## 2021-06-22 DIAGNOSIS — M8588 Other specified disorders of bone density and structure, other site: Secondary | ICD-10-CM | POA: Diagnosis not present

## 2021-06-22 DIAGNOSIS — M8008XD Age-related osteoporosis with current pathological fracture, vertebra(e), subsequent encounter for fracture with routine healing: Secondary | ICD-10-CM | POA: Diagnosis not present

## 2021-06-22 DIAGNOSIS — G40209 Localization-related (focal) (partial) symptomatic epilepsy and epileptic syndromes with complex partial seizures, not intractable, without status epilepticus: Secondary | ICD-10-CM

## 2021-06-22 DIAGNOSIS — Z85038 Personal history of other malignant neoplasm of large intestine: Secondary | ICD-10-CM | POA: Diagnosis not present

## 2021-06-22 DIAGNOSIS — D320 Benign neoplasm of cerebral meninges: Secondary | ICD-10-CM | POA: Diagnosis not present

## 2021-06-22 DIAGNOSIS — I110 Hypertensive heart disease with heart failure: Secondary | ICD-10-CM | POA: Diagnosis not present

## 2021-06-22 DIAGNOSIS — I509 Heart failure, unspecified: Secondary | ICD-10-CM | POA: Diagnosis not present

## 2021-06-22 DIAGNOSIS — E039 Hypothyroidism, unspecified: Secondary | ICD-10-CM | POA: Diagnosis not present

## 2021-06-22 DIAGNOSIS — Z9181 History of falling: Secondary | ICD-10-CM | POA: Diagnosis not present

## 2021-06-22 MED ORDER — LEVETIRACETAM ER 1000 MG PO TB24: 2000.0000 mg | ORAL_TABLET | Freq: Every day | ORAL | 4 refills | Status: DC

## 2021-06-22 NOTE — Patient Instructions (Signed)
  SEIZURE DISORDER (last seizure 06/06/21; unprovoked)  - continue LEV XR 2000mg  daily  - According to [place] law, you can not drive unless you are seizure / syncope free for at least 6 months and under physician's care.   - Please maintain precautions. Do not participate in activities where a loss of awareness could harm you or someone else. No swimming alone, no tub bathing, no hot tubs, no driving, no operating motorized vehicles (cars, ATVs, motocycles, etc), lawnmowers, power tools or firearms. No standing at heights, such as rooftops, ladders or stairs. Avoid hot objects such as stoves, heaters, open fires. Wear a helmet when riding a bicycle, scooter, skateboard, etc. and avoid areas of traffic. Set your water heater to 120 degrees or less.

## 2021-06-22 NOTE — Telephone Encounter (Signed)
Pt has called to report that the medication called in with ER is not covered under her plan, pt asking to be called to discuss another medication being called in to replace it.

## 2021-06-22 NOTE — Progress Notes (Signed)
Chief Complaint  Patient presents with   Follow-up    RM 6 with friend Kathy Scott- here to f/u on seizure like event on 06/06/2021. Pt reports she was alone with the event took place and does not remember much. She does report when she fell from the seizure she fractured her back.     History of Present Illness:  UPDATE (06/22/21, VRP): Since last visit, recent breakthrough sz in Nov 2022. Went to ER, now on LEV XR 2000mg  daily. Stable and back to baseline.   UPDATE (03/07/21, VRP): Since last visit, doing well. Symptoms are stable. No alleviating or aggravating factors. Tolerating levetiracetam.   UPDATE (12/03/19, VRP): Since last visit, doing well. Symptoms are stable. No alleviating or aggravating factors. Tolerating levetiracetam.    UPDATE (12/03/18, VRP): Since last visit, doing well. No seizures. Symptoms are stable. Tolerating LEV XR 1000mg  daily.    UPDATE (01/01/18, VRP): Since last visit, doing well. Tolerating meds. No alleviating or aggravating factors. No seizures. Mother has passed away (59 yrs old). 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 Lost some weight with "Next 56 days" program from Kathy Scott.    UPDATE 09/05/16: Since last visit, doing well. No seizures. Medication (Keppra XR 1000mg  in AM) doing well and covered by her insurance and retirement plan. Pt mother was in hospital last year (x 2), but now better. Has a new Apple Watch and planning to start exercise program again this year.    Observations/Objective:  GENERAL EXAM/CONSTITUTIONAL: Vitals:  Vitals:   06/22/21 0907  BP: 136/78  Pulse: 65  SpO2: 98%  Weight: 148 lb 8 oz (67.4 kg)  Height: 4' 9.75" (1.467 m)    Body mass index is 31.31 kg/m. Wt Readings from Last 3 Encounters:  06/22/21 148 lb 8 oz (67.4 kg)  06/07/21 154 lb 12.2 oz (70.2 kg)  03/07/21 152 lb 4 oz (69.1 kg)   Patient is in no distress; well developed, nourished and groomed; neck is supple  CARDIOVASCULAR: Examination of carotid  arteries is normal; no carotid bruits Regular rate and rhythm, no murmurs Examination of peripheral vascular system by observation and palpation is normal  EYES: Ophthalmoscopic exam of optic discs and posterior segments is normal; no papilledema or hemorrhages No results found.  MUSCULOSKELETAL: Gait, strength, tone, movements noted in Neurologic exam below  NEUROLOGIC: MENTAL STATUS:  No flowsheet data found. awake, alert, oriented to person, place and time recent and remote memory intact normal attention and concentration language fluent, comprehension intact, naming intact fund of knowledge appropriate  CRANIAL NERVE:  2nd - no papilledema on fundoscopic exam 2nd, 3rd, 4th, 6th - pupils equal and reactive to light, visual fields full to confrontation, extraocular muscles intact, no nystagmus 5th - facial sensation symmetric 7th - facial strength symmetric 8th - hearing intact 9th - palate elevates symmetrically, uvula midline 11th - shoulder shrug symmetric 12th - tongue protrusion midline  MOTOR:  normal bulk and tone, full strength in the BUE, BLE  SENSORY:  normal and symmetric to light touch  COORDINATION:  finger-nose-finger, fine finger movements normal  REFLEXES:  deep tendon reflexes present and symmetric  GAIT/STATION:  narrow based gait  No results found for: HGBA1C  Lab Results  Component Value Date   WBC 9.5 06/09/2021   HGB 11.1 (L) 06/09/2021   HCT 32.7 (L) 06/09/2021   MCV 94.8 06/09/2021   PLT 293 06/09/2021   CMP Latest Ref Rng & Units 06/09/2021 06/08/2021 06/07/2021  Glucose 70 -  99 mg/dL 98 110(H) 98  BUN 8 - 23 mg/dL 18 10 8   Creatinine 0.44 - 1.00 mg/dL 1.00 0.99 0.84  Sodium 135 - 145 mmol/L 138 139 142  Potassium 3.5 - 5.1 mmol/L 3.5 3.7 3.7  Chloride 98 - 111 mmol/L 107 108 108  CO2 22 - 32 mmol/L 20(L) 22 20(L)  Calcium 8.9 - 10.3 mg/dL 9.6 9.2 9.3  Total Protein 6.5 - 8.1 g/dL - 7.2 -  Total Bilirubin 0.3 - 1.2 mg/dL -  1.0 -  Alkaline Phos 38 - 126 U/L - 85 -  AST 15 - 41 U/L - 25 -  ALT 0 - 44 U/L - 18 -     06/07/21 MRI brain [I reviewed images myself and agree with interpretation. Likely dural based calcification vs meningioma; incidental finding.  -VRP]  1. No acute finding. 2. 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 3. Intermittent motion artifact.   Assessment and Plan:  70 y.o. female  has a past medical history of Cancer (Kathy Scott), Foot fracture, Hypertension, Hypothyroidism, and Seizures (Kathy Scott). here with seizure disorder, maintained for ~20 years on dilantin, now with osteoporosis. Successfully transitioned to levetiracetam XR 1000 mg daily, and off dilantin (since Sept 2014). Last seizure ~ 2011 (when off meds x 3 days). Then unprovoked breakthrough sz 06/06/20 (now on LEV XR 2000mg  daily).   Dx:  1. Partial symptomatic epilepsy with complex partial seizures, not intractable, without status epilepticus (Kathy Scott)      SEIZURE DISORDER (last seizure 06/06/21; unprovoked) - continue LEV XR 2000mg  daily (generic ok)  - reviewed health and wellness strategies (fitness)  - According to [place] law, you can not drive unless you are seizure / syncope free for at least 6 months and under physician's care.   - Please maintain precautions. Do not participate in activities where a loss of awareness could harm you or someone else. No swimming alone, no tub bathing, no hot tubs, no driving, no operating motorized vehicles (cars, ATVs, motocycles, etc), lawnmowers, power tools or firearms. No standing at heights, such as rooftops, ladders or stairs. Avoid hot objects such as stoves, heaters, open fires. Wear a helmet when riding a bicycle, scooter, skateboard, etc. and avoid areas of traffic. Set your water heater to 120 degrees or less.    HYPOKALEMIA - replaced in hospital; follow up with PCP   HYPERGLYCEMIA  - check A1c   Orders Placed This Encounter  Procedures   Hemoglobin A1c   Meds ordered this  encounter  Medications   levETIRAcetam ER 1000 MG TB24    Sig: Take 2,000 mg by mouth at bedtime.    Dispense:  180 tablet    Refill:  4    Follow Up Instructions:  - Return in about 9 months (around 03/23/2022) for MyChart visit (15 min).     Penni Bombard, MD 84/11/3644, 8:03 AM Certified in Neurology, Neurophysiology and Neuroimaging  [facility] [address], [place], [place] [address] [phone]

## 2021-06-23 DIAGNOSIS — I10 Essential (primary) hypertension: Secondary | ICD-10-CM | POA: Diagnosis not present

## 2021-06-23 DIAGNOSIS — D72829 Elevated white blood cell count, unspecified: Secondary | ICD-10-CM | POA: Diagnosis not present

## 2021-06-23 DIAGNOSIS — K219 Gastro-esophageal reflux disease without esophagitis: Secondary | ICD-10-CM | POA: Diagnosis not present

## 2021-06-23 DIAGNOSIS — J9601 Acute respiratory failure with hypoxia: Secondary | ICD-10-CM | POA: Diagnosis not present

## 2021-06-23 DIAGNOSIS — E876 Hypokalemia: Secondary | ICD-10-CM | POA: Diagnosis not present

## 2021-06-23 DIAGNOSIS — E039 Hypothyroidism, unspecified: Secondary | ICD-10-CM | POA: Diagnosis not present

## 2021-06-23 DIAGNOSIS — M19272 Secondary osteoarthritis, left ankle and foot: Secondary | ICD-10-CM | POA: Diagnosis not present

## 2021-06-23 DIAGNOSIS — I5031 Acute diastolic (congestive) heart failure: Secondary | ICD-10-CM | POA: Diagnosis not present

## 2021-06-23 DIAGNOSIS — D320 Benign neoplasm of cerebral meninges: Secondary | ICD-10-CM | POA: Diagnosis not present

## 2021-06-23 DIAGNOSIS — M810 Age-related osteoporosis without current pathological fracture: Secondary | ICD-10-CM | POA: Diagnosis not present

## 2021-06-23 DIAGNOSIS — S22000A Wedge compression fracture of unspecified thoracic vertebra, initial encounter for closed fracture: Secondary | ICD-10-CM | POA: Diagnosis not present

## 2021-06-23 LAB — HEMOGLOBIN A1C
Est. average glucose Bld gHb Est-mCnc: 103 mg/dL
Hgb A1c MFr Bld: 5.2 % (ref 4.8–5.6)

## 2021-06-23 MED ORDER — LEVETIRACETAM ER 1000 MG PO TB24: 2000.0000 mg | ORAL_TABLET | Freq: Every day | ORAL | 4 refills | Status: DC

## 2021-06-23 NOTE — Telephone Encounter (Signed)
[facility] and spoke with C[facility], she sts [facility] not [facility] the generic formulation of Keppra and is unable to dispense the brand name due to insurance not covering. I called the pt back and advised of this and we are going to [facility]. Pt would prefer to use a delivery system for medications. I have sent rx today for pt.  Pt will call us back if there is any issues with [facility] giving her the medication.

## 2021-06-23 NOTE — Addendum Note (Signed)
Addended by: Verlin Grills on: 06/23/2021 10:12 AM   Modules accepted: Orders

## 2021-06-24 DIAGNOSIS — E039 Hypothyroidism, unspecified: Secondary | ICD-10-CM | POA: Diagnosis not present

## 2021-06-24 DIAGNOSIS — Z9181 History of falling: Secondary | ICD-10-CM | POA: Diagnosis not present

## 2021-06-24 DIAGNOSIS — M8588 Other specified disorders of bone density and structure, other site: Secondary | ICD-10-CM | POA: Diagnosis not present

## 2021-06-24 DIAGNOSIS — I509 Heart failure, unspecified: Secondary | ICD-10-CM | POA: Diagnosis not present

## 2021-06-24 DIAGNOSIS — M8008XD Age-related osteoporosis with current pathological fracture, vertebra(e), subsequent encounter for fracture with routine healing: Secondary | ICD-10-CM | POA: Diagnosis not present

## 2021-06-24 DIAGNOSIS — Z7952 Long term (current) use of systemic steroids: Secondary | ICD-10-CM | POA: Diagnosis not present

## 2021-06-24 DIAGNOSIS — M4804 Spinal stenosis, thoracic region: Secondary | ICD-10-CM | POA: Diagnosis not present

## 2021-06-24 DIAGNOSIS — I110 Hypertensive heart disease with heart failure: Secondary | ICD-10-CM | POA: Diagnosis not present

## 2021-06-24 DIAGNOSIS — Z9981 Dependence on supplemental oxygen: Secondary | ICD-10-CM | POA: Diagnosis not present

## 2021-06-24 DIAGNOSIS — D320 Benign neoplasm of cerebral meninges: Secondary | ICD-10-CM | POA: Diagnosis not present

## 2021-06-24 DIAGNOSIS — Z85038 Personal history of other malignant neoplasm of large intestine: Secondary | ICD-10-CM | POA: Diagnosis not present

## 2021-06-24 DIAGNOSIS — Z87891 Personal history of nicotine dependence: Secondary | ICD-10-CM | POA: Diagnosis not present

## 2021-06-27 DIAGNOSIS — I509 Heart failure, unspecified: Secondary | ICD-10-CM | POA: Diagnosis not present

## 2021-06-27 DIAGNOSIS — M8008XD Age-related osteoporosis with current pathological fracture, vertebra(e), subsequent encounter for fracture with routine healing: Secondary | ICD-10-CM | POA: Diagnosis not present

## 2021-06-27 DIAGNOSIS — M4804 Spinal stenosis, thoracic region: Secondary | ICD-10-CM | POA: Diagnosis not present

## 2021-06-27 DIAGNOSIS — I110 Hypertensive heart disease with heart failure: Secondary | ICD-10-CM | POA: Diagnosis not present

## 2021-06-27 DIAGNOSIS — Z85038 Personal history of other malignant neoplasm of large intestine: Secondary | ICD-10-CM | POA: Diagnosis not present

## 2021-06-27 DIAGNOSIS — M8588 Other specified disorders of bone density and structure, other site: Secondary | ICD-10-CM | POA: Diagnosis not present

## 2021-06-27 DIAGNOSIS — Z7952 Long term (current) use of systemic steroids: Secondary | ICD-10-CM | POA: Diagnosis not present

## 2021-06-27 DIAGNOSIS — Z9181 History of falling: Secondary | ICD-10-CM | POA: Diagnosis not present

## 2021-06-27 DIAGNOSIS — E039 Hypothyroidism, unspecified: Secondary | ICD-10-CM | POA: Diagnosis not present

## 2021-06-27 DIAGNOSIS — Z87891 Personal history of nicotine dependence: Secondary | ICD-10-CM | POA: Diagnosis not present

## 2021-06-27 DIAGNOSIS — D320 Benign neoplasm of cerebral meninges: Secondary | ICD-10-CM | POA: Diagnosis not present

## 2021-06-27 DIAGNOSIS — Z9981 Dependence on supplemental oxygen: Secondary | ICD-10-CM | POA: Diagnosis not present

## 2021-06-28 NOTE — Telephone Encounter (Signed)
Contacted pharmacy, they received Rx but insurance denied brand name. They also do not carry generic form of keppra. Pharmacy will fax over PA.

## 2021-06-28 NOTE — Telephone Encounter (Signed)
Contacted pt back, informed her we [facility]/21 to [facility]. Will contact pharmacy for verification

## 2021-06-28 NOTE — Telephone Encounter (Signed)
Pt called, have not received levETIRAcetam ER 1000 MG TB24 from [facility]. Would like a call from the nurse.

## 2021-06-29 ENCOUNTER — Telehealth: Payer: Self-pay

## 2021-06-29 ENCOUNTER — Other Ambulatory Visit: Payer: Self-pay

## 2021-06-29 DIAGNOSIS — Z87891 Personal history of nicotine dependence: Secondary | ICD-10-CM | POA: Diagnosis not present

## 2021-06-29 DIAGNOSIS — I509 Heart failure, unspecified: Secondary | ICD-10-CM | POA: Diagnosis not present

## 2021-06-29 DIAGNOSIS — Z85038 Personal history of other malignant neoplasm of large intestine: Secondary | ICD-10-CM | POA: Diagnosis not present

## 2021-06-29 DIAGNOSIS — Z9181 History of falling: Secondary | ICD-10-CM | POA: Diagnosis not present

## 2021-06-29 DIAGNOSIS — Z7952 Long term (current) use of systemic steroids: Secondary | ICD-10-CM | POA: Diagnosis not present

## 2021-06-29 DIAGNOSIS — Z9981 Dependence on supplemental oxygen: Secondary | ICD-10-CM | POA: Diagnosis not present

## 2021-06-29 DIAGNOSIS — E039 Hypothyroidism, unspecified: Secondary | ICD-10-CM | POA: Diagnosis not present

## 2021-06-29 DIAGNOSIS — I110 Hypertensive heart disease with heart failure: Secondary | ICD-10-CM | POA: Diagnosis not present

## 2021-06-29 DIAGNOSIS — M8588 Other specified disorders of bone density and structure, other site: Secondary | ICD-10-CM | POA: Diagnosis not present

## 2021-06-29 DIAGNOSIS — D320 Benign neoplasm of cerebral meninges: Secondary | ICD-10-CM | POA: Diagnosis not present

## 2021-06-29 DIAGNOSIS — M8008XD Age-related osteoporosis with current pathological fracture, vertebra(e), subsequent encounter for fracture with routine healing: Secondary | ICD-10-CM | POA: Diagnosis not present

## 2021-06-29 DIAGNOSIS — M4804 Spinal stenosis, thoracic region: Secondary | ICD-10-CM | POA: Diagnosis not present

## 2021-06-29 MED ORDER — LEVETIRACETAM ER 1000 MG PO TB24: 2000.0000 mg | ORAL_TABLET | Freq: Every day | ORAL | 4 refills | Status: DC

## 2021-06-29 NOTE — Telephone Encounter (Signed)
PA submitted via CMM- KeyConstance Holster - PA Case ID: [id number] - Rx #: [id number] Your information has been sent to [facility]. Awaiting determination

## 2021-06-29 NOTE — Telephone Encounter (Signed)
Contacted pt to notify her of denial, LVM rq call back.

## 2021-06-29 NOTE — Telephone Encounter (Signed)
Pt returned call, informed her insurance denied medication going through mail service. Advised her it would be better if she went back to a local pharmacy for refills like she previously was. She was ok with that plan, Rx sent to [facility]

## 2021-06-29 NOTE — Telephone Encounter (Addendum)
Kathy Scott denied: not covered under Medicare Part D, based on 2023 coverage.  Kathy Scott Tab 1000mg  is one of the drugs that are excluded from Medicare coverage by law, and we do not offer the drug as a supplemental benefit. ELEPSIA XR TAB 1000MG  is not covered. The requested medication with the submitted Generic Product Identifier (GPI)/National Drug Code [facility]) is not properly listed with the Food and Drug Administration (FDA) and does not meet regulatory requirements that would constitute a Part D-eligible drug. Therefore, the medication is not covered under your Part D prescription drug plan. Denial letter doesn't give appeal as option. Will send to MD.

## 2021-07-01 NOTE — Telephone Encounter (Signed)
Pt called states pharmacy does not do 1000MG  needs to be 500 of the levETIRAcetam ER 1000 MG TB24. [id number] - [place], [address] AT [address].

## 2021-07-04 ENCOUNTER — Other Ambulatory Visit: Payer: Self-pay

## 2021-07-04 DIAGNOSIS — Z7952 Long term (current) use of systemic steroids: Secondary | ICD-10-CM | POA: Diagnosis not present

## 2021-07-04 DIAGNOSIS — Z9981 Dependence on supplemental oxygen: Secondary | ICD-10-CM | POA: Diagnosis not present

## 2021-07-04 DIAGNOSIS — D320 Benign neoplasm of cerebral meninges: Secondary | ICD-10-CM | POA: Diagnosis not present

## 2021-07-04 DIAGNOSIS — M4804 Spinal stenosis, thoracic region: Secondary | ICD-10-CM | POA: Diagnosis not present

## 2021-07-04 DIAGNOSIS — Z85038 Personal history of other malignant neoplasm of large intestine: Secondary | ICD-10-CM | POA: Diagnosis not present

## 2021-07-04 DIAGNOSIS — Z9181 History of falling: Secondary | ICD-10-CM | POA: Diagnosis not present

## 2021-07-04 DIAGNOSIS — I110 Hypertensive heart disease with heart failure: Secondary | ICD-10-CM | POA: Diagnosis not present

## 2021-07-04 DIAGNOSIS — M8008XD Age-related osteoporosis with current pathological fracture, vertebra(e), subsequent encounter for fracture with routine healing: Secondary | ICD-10-CM | POA: Diagnosis not present

## 2021-07-04 DIAGNOSIS — E039 Hypothyroidism, unspecified: Secondary | ICD-10-CM | POA: Diagnosis not present

## 2021-07-04 DIAGNOSIS — M8588 Other specified disorders of bone density and structure, other site: Secondary | ICD-10-CM | POA: Diagnosis not present

## 2021-07-04 DIAGNOSIS — Z87891 Personal history of nicotine dependence: Secondary | ICD-10-CM | POA: Diagnosis not present

## 2021-07-04 DIAGNOSIS — I509 Heart failure, unspecified: Secondary | ICD-10-CM | POA: Diagnosis not present

## 2021-07-04 MED ORDER — LEVETIRACETAM ER 500 MG PO TB24: 2000.0000 mg | ORAL_TABLET | Freq: Every day | ORAL | 4 refills | Status: DC

## 2021-07-04 MED ORDER — LEVETIRACETAM ER 1000 MG PO TB24: 2000.0000 mg | ORAL_TABLET | Freq: Every day | ORAL | 4 refills | Status: DC

## 2021-07-04 NOTE — Telephone Encounter (Addendum)
Rx was sent 06/29/21 receipt confirmed. Contacted pharmacy to verify, they did receive Rx. Pharmacies states ER doesn't go up to 1000, only 500mg  and she will need to do 4 tab daily.  How would you like tablets administered?

## 2021-07-04 NOTE — Addendum Note (Signed)
Addended by: Gertie Baron D on: 07/04/2021 09:29 AM   Modules accepted: Orders

## 2021-07-04 NOTE — Telephone Encounter (Signed)
Meds ordered this encounter  Medications   levETIRAcetam ER 1000 MG TB24    Sig: Take 2,000 mg by mouth at bedtime.    Dispense:  180 tablet    Refill:  [place], MD 74/03/9277, 0:04 AM Certified in Neurology, Neurophysiology and Neuroimaging  [facility] [address], [place], [place] [address] [phone]

## 2021-07-04 NOTE — Telephone Encounter (Signed)
Pt called, spoke with the pharmacy; problem getting prescription approve for 1000mg . Can the physician change to prescription to 4 tablets 500 mg  daily. Would like a call from the nurse.

## 2021-07-04 NOTE — Telephone Encounter (Signed)
Please change how you want Rx administered and dose. The pended Rx was the same as before.  See previous note stating: Pharmacist states ER doesn't go up to 1000, only 500mg  and she will need to do 4 tab daily.  How would you like tablets administered?

## 2021-07-04 NOTE — Telephone Encounter (Signed)
New Rx sent.

## 2021-07-04 NOTE — Addendum Note (Signed)
Addended by: Andrey Spearman R on: 07/04/2021 09:47 AM   Modules accepted: Orders

## 2021-07-04 NOTE — Telephone Encounter (Signed)
Please read my previous note

## 2021-07-06 DIAGNOSIS — Z87891 Personal history of nicotine dependence: Secondary | ICD-10-CM | POA: Diagnosis not present

## 2021-07-06 DIAGNOSIS — D320 Benign neoplasm of cerebral meninges: Secondary | ICD-10-CM | POA: Diagnosis not present

## 2021-07-06 DIAGNOSIS — Z85038 Personal history of other malignant neoplasm of large intestine: Secondary | ICD-10-CM | POA: Diagnosis not present

## 2021-07-06 DIAGNOSIS — M4804 Spinal stenosis, thoracic region: Secondary | ICD-10-CM | POA: Diagnosis not present

## 2021-07-06 DIAGNOSIS — I509 Heart failure, unspecified: Secondary | ICD-10-CM | POA: Diagnosis not present

## 2021-07-06 DIAGNOSIS — Z9181 History of falling: Secondary | ICD-10-CM | POA: Diagnosis not present

## 2021-07-06 DIAGNOSIS — M8008XD Age-related osteoporosis with current pathological fracture, vertebra(e), subsequent encounter for fracture with routine healing: Secondary | ICD-10-CM | POA: Diagnosis not present

## 2021-07-06 DIAGNOSIS — Z9981 Dependence on supplemental oxygen: Secondary | ICD-10-CM | POA: Diagnosis not present

## 2021-07-06 DIAGNOSIS — I110 Hypertensive heart disease with heart failure: Secondary | ICD-10-CM | POA: Diagnosis not present

## 2021-07-06 DIAGNOSIS — M8588 Other specified disorders of bone density and structure, other site: Secondary | ICD-10-CM | POA: Diagnosis not present

## 2021-07-06 DIAGNOSIS — Z7952 Long term (current) use of systemic steroids: Secondary | ICD-10-CM | POA: Diagnosis not present

## 2021-07-06 DIAGNOSIS — E039 Hypothyroidism, unspecified: Secondary | ICD-10-CM | POA: Diagnosis not present

## 2021-07-10 DIAGNOSIS — J9601 Acute respiratory failure with hypoxia: Secondary | ICD-10-CM | POA: Diagnosis not present

## 2021-07-13 DIAGNOSIS — M8588 Other specified disorders of bone density and structure, other site: Secondary | ICD-10-CM | POA: Diagnosis not present

## 2021-07-13 DIAGNOSIS — E039 Hypothyroidism, unspecified: Secondary | ICD-10-CM | POA: Diagnosis not present

## 2021-07-13 DIAGNOSIS — I509 Heart failure, unspecified: Secondary | ICD-10-CM | POA: Diagnosis not present

## 2021-07-13 DIAGNOSIS — Z9981 Dependence on supplemental oxygen: Secondary | ICD-10-CM | POA: Diagnosis not present

## 2021-07-13 DIAGNOSIS — Z9181 History of falling: Secondary | ICD-10-CM | POA: Diagnosis not present

## 2021-07-13 DIAGNOSIS — I110 Hypertensive heart disease with heart failure: Secondary | ICD-10-CM | POA: Diagnosis not present

## 2021-07-13 DIAGNOSIS — Z87891 Personal history of nicotine dependence: Secondary | ICD-10-CM | POA: Diagnosis not present

## 2021-07-13 DIAGNOSIS — Z7952 Long term (current) use of systemic steroids: Secondary | ICD-10-CM | POA: Diagnosis not present

## 2021-07-13 DIAGNOSIS — M8008XD Age-related osteoporosis with current pathological fracture, vertebra(e), subsequent encounter for fracture with routine healing: Secondary | ICD-10-CM | POA: Diagnosis not present

## 2021-07-13 DIAGNOSIS — M4804 Spinal stenosis, thoracic region: Secondary | ICD-10-CM | POA: Diagnosis not present

## 2021-07-13 DIAGNOSIS — D320 Benign neoplasm of cerebral meninges: Secondary | ICD-10-CM | POA: Diagnosis not present

## 2021-07-13 DIAGNOSIS — Z85038 Personal history of other malignant neoplasm of large intestine: Secondary | ICD-10-CM | POA: Diagnosis not present

## 2021-07-15 DIAGNOSIS — Z9981 Dependence on supplemental oxygen: Secondary | ICD-10-CM | POA: Diagnosis not present

## 2021-07-15 DIAGNOSIS — Z87891 Personal history of nicotine dependence: Secondary | ICD-10-CM | POA: Diagnosis not present

## 2021-07-15 DIAGNOSIS — I509 Heart failure, unspecified: Secondary | ICD-10-CM | POA: Diagnosis not present

## 2021-07-15 DIAGNOSIS — M8588 Other specified disorders of bone density and structure, other site: Secondary | ICD-10-CM | POA: Diagnosis not present

## 2021-07-15 DIAGNOSIS — Z85038 Personal history of other malignant neoplasm of large intestine: Secondary | ICD-10-CM | POA: Diagnosis not present

## 2021-07-15 DIAGNOSIS — D320 Benign neoplasm of cerebral meninges: Secondary | ICD-10-CM | POA: Diagnosis not present

## 2021-07-15 DIAGNOSIS — M4804 Spinal stenosis, thoracic region: Secondary | ICD-10-CM | POA: Diagnosis not present

## 2021-07-15 DIAGNOSIS — Z7952 Long term (current) use of systemic steroids: Secondary | ICD-10-CM | POA: Diagnosis not present

## 2021-07-15 DIAGNOSIS — E039 Hypothyroidism, unspecified: Secondary | ICD-10-CM | POA: Diagnosis not present

## 2021-07-15 DIAGNOSIS — M8008XD Age-related osteoporosis with current pathological fracture, vertebra(e), subsequent encounter for fracture with routine healing: Secondary | ICD-10-CM | POA: Diagnosis not present

## 2021-07-15 DIAGNOSIS — I110 Hypertensive heart disease with heart failure: Secondary | ICD-10-CM | POA: Diagnosis not present

## 2021-07-15 DIAGNOSIS — Z9181 History of falling: Secondary | ICD-10-CM | POA: Diagnosis not present

## 2021-07-20 ENCOUNTER — Ambulatory Visit: Payer: Medicare Other | Admitting: Diagnostic Neuroimaging

## 2021-07-25 ENCOUNTER — Telehealth: Payer: Self-pay | Admitting: Diagnostic Neuroimaging

## 2021-07-25 MED ORDER — LEVETIRACETAM ER 500 MG PO TB24: 2000.0000 mg | ORAL_TABLET | Freq: Every day | ORAL | 4 refills | Status: DC

## 2021-07-25 NOTE — Telephone Encounter (Signed)
Pt called states she received her levETIRAcetam (KEPPRA XR) 500 MG 24 hr tablet from [facility] however it is not reflecting the new prescription, still states to take 2 instead of 4. Pt requesting a call back so she will know what to do.

## 2021-07-25 NOTE — Telephone Encounter (Signed)
Contacted pt, during last discussion she informed us insurance will not cover [place], 1000 mg. She stated they will cover 500 x4 tab. I will discontinue current Rx at [facility] and send to [facility]

## 2021-08-01 DIAGNOSIS — M19272 Secondary osteoarthritis, left ankle and foot: Secondary | ICD-10-CM | POA: Diagnosis not present

## 2021-08-01 DIAGNOSIS — M810 Age-related osteoporosis without current pathological fracture: Secondary | ICD-10-CM | POA: Diagnosis not present

## 2021-08-01 DIAGNOSIS — K219 Gastro-esophageal reflux disease without esophagitis: Secondary | ICD-10-CM | POA: Diagnosis not present

## 2021-08-01 DIAGNOSIS — E039 Hypothyroidism, unspecified: Secondary | ICD-10-CM | POA: Diagnosis not present

## 2021-08-01 DIAGNOSIS — I10 Essential (primary) hypertension: Secondary | ICD-10-CM | POA: Diagnosis not present

## 2021-08-15 NOTE — Telephone Encounter (Signed)
Pt returned call, states pharmacy was trying to fill brand name (keppra) and not levetriacetam. Informed pt it was sent in as levetriracetam so not sure why they would fill keppra, pt stated they are working on getting it corrected for her and will let us know of any issues.

## 2021-08-15 NOTE — Telephone Encounter (Signed)
Pt called office this morning stating she needs refill for levETIRAcetam (KEPPRA XR) 500 MG. Informed pt I [facility] Rx [facility], on 07/25/21 when we last spoke with 4 refills. She stated she spoke to pharmacy Sat and they told her they did not. I contacted pharmacy for verification, they informed me they do have the Rx but insurance is not covering it a PA is needed. In previous conversation, pt informed me her insurance will not cover 2 tab but will cover 4 tab. I sent in Rx for 4 tab. Pt stated she will contact her insurance to see what was going on and what was needed prior to me submitting PA. She will contact me back to inform me what is needed.

## 2021-10-12 DIAGNOSIS — Z79899 Other long term (current) drug therapy: Secondary | ICD-10-CM | POA: Diagnosis not present

## 2021-10-12 DIAGNOSIS — M109 Gout, unspecified: Secondary | ICD-10-CM | POA: Diagnosis not present

## 2021-10-12 DIAGNOSIS — M810 Age-related osteoporosis without current pathological fracture: Secondary | ICD-10-CM | POA: Diagnosis not present

## 2021-10-12 DIAGNOSIS — M1990 Unspecified osteoarthritis, unspecified site: Secondary | ICD-10-CM | POA: Diagnosis not present

## 2021-10-12 DIAGNOSIS — E039 Hypothyroidism, unspecified: Secondary | ICD-10-CM | POA: Diagnosis not present

## 2021-10-17 DIAGNOSIS — M810 Age-related osteoporosis without current pathological fracture: Secondary | ICD-10-CM | POA: Diagnosis not present

## 2021-10-17 DIAGNOSIS — E559 Vitamin D deficiency, unspecified: Secondary | ICD-10-CM | POA: Diagnosis not present

## 2021-10-17 DIAGNOSIS — M25512 Pain in left shoulder: Secondary | ICD-10-CM | POA: Diagnosis not present

## 2021-10-17 DIAGNOSIS — I10 Essential (primary) hypertension: Secondary | ICD-10-CM | POA: Diagnosis not present

## 2021-10-17 DIAGNOSIS — I5030 Unspecified diastolic (congestive) heart failure: Secondary | ICD-10-CM | POA: Diagnosis not present

## 2021-10-17 DIAGNOSIS — E039 Hypothyroidism, unspecified: Secondary | ICD-10-CM | POA: Diagnosis not present

## 2021-10-17 DIAGNOSIS — D320 Benign neoplasm of cerebral meninges: Secondary | ICD-10-CM | POA: Diagnosis not present

## 2021-10-17 DIAGNOSIS — M109 Gout, unspecified: Secondary | ICD-10-CM | POA: Diagnosis not present

## 2021-10-18 ENCOUNTER — Encounter: Payer: Self-pay | Admitting: Neurology

## 2021-10-18 DIAGNOSIS — M25512 Pain in left shoulder: Secondary | ICD-10-CM | POA: Diagnosis not present

## 2021-10-26 DIAGNOSIS — M25512 Pain in left shoulder: Secondary | ICD-10-CM | POA: Diagnosis not present

## 2021-10-26 DIAGNOSIS — R531 Weakness: Secondary | ICD-10-CM | POA: Diagnosis not present

## 2021-10-26 DIAGNOSIS — M25612 Stiffness of left shoulder, not elsewhere classified: Secondary | ICD-10-CM | POA: Diagnosis not present

## 2021-10-26 DIAGNOSIS — S46012D Strain of muscle(s) and tendon(s) of the rotator cuff of left shoulder, subsequent encounter: Secondary | ICD-10-CM | POA: Diagnosis not present

## 2021-11-01 DIAGNOSIS — S46012D Strain of muscle(s) and tendon(s) of the rotator cuff of left shoulder, subsequent encounter: Secondary | ICD-10-CM | POA: Diagnosis not present

## 2021-11-01 DIAGNOSIS — M25612 Stiffness of left shoulder, not elsewhere classified: Secondary | ICD-10-CM | POA: Diagnosis not present

## 2021-11-01 DIAGNOSIS — M25512 Pain in left shoulder: Secondary | ICD-10-CM | POA: Diagnosis not present

## 2021-11-01 DIAGNOSIS — R531 Weakness: Secondary | ICD-10-CM | POA: Diagnosis not present

## 2021-11-04 DIAGNOSIS — M25612 Stiffness of left shoulder, not elsewhere classified: Secondary | ICD-10-CM | POA: Diagnosis not present

## 2021-11-04 DIAGNOSIS — R531 Weakness: Secondary | ICD-10-CM | POA: Diagnosis not present

## 2021-11-04 DIAGNOSIS — S46012D Strain of muscle(s) and tendon(s) of the rotator cuff of left shoulder, subsequent encounter: Secondary | ICD-10-CM | POA: Diagnosis not present

## 2021-11-04 DIAGNOSIS — M25512 Pain in left shoulder: Secondary | ICD-10-CM | POA: Diagnosis not present

## 2021-11-08 DIAGNOSIS — M25512 Pain in left shoulder: Secondary | ICD-10-CM | POA: Diagnosis not present

## 2021-11-08 DIAGNOSIS — R531 Weakness: Secondary | ICD-10-CM | POA: Diagnosis not present

## 2021-11-08 DIAGNOSIS — M25612 Stiffness of left shoulder, not elsewhere classified: Secondary | ICD-10-CM | POA: Diagnosis not present

## 2021-11-08 DIAGNOSIS — S46012D Strain of muscle(s) and tendon(s) of the rotator cuff of left shoulder, subsequent encounter: Secondary | ICD-10-CM | POA: Diagnosis not present

## 2021-11-10 DIAGNOSIS — M25512 Pain in left shoulder: Secondary | ICD-10-CM | POA: Diagnosis not present

## 2021-11-10 DIAGNOSIS — S46012D Strain of muscle(s) and tendon(s) of the rotator cuff of left shoulder, subsequent encounter: Secondary | ICD-10-CM | POA: Diagnosis not present

## 2021-11-10 DIAGNOSIS — M25612 Stiffness of left shoulder, not elsewhere classified: Secondary | ICD-10-CM | POA: Diagnosis not present

## 2021-11-10 DIAGNOSIS — R531 Weakness: Secondary | ICD-10-CM | POA: Diagnosis not present

## 2021-11-14 ENCOUNTER — Encounter: Payer: Self-pay | Admitting: Neurology

## 2021-11-14 ENCOUNTER — Ambulatory Visit: Payer: Medicare Other | Admitting: Neurology

## 2021-11-14 VITALS — BP 165/86 | HR 62 | Ht <= 58 in | Wt 151.2 lb

## 2021-11-14 DIAGNOSIS — G40009 Localization-related (focal) (partial) idiopathic epilepsy and epileptic syndromes with seizures of localized onset, not intractable, without status epilepticus: Secondary | ICD-10-CM

## 2021-11-14 DIAGNOSIS — D329 Benign neoplasm of meninges, unspecified: Secondary | ICD-10-CM

## 2021-11-14 NOTE — Patient Instructions (Addendum)
Good to meet you. ? ?Schedule MRI brain with and without contrast ? ?2. Schedule 1-hour EEG ? ?3. Continue Levetiracetam ER '500mg'$ : take 4 tablets every night ? ?4. Follow-up in 3 months, call for any changes ? ? ?Seizure Precautions: ?1. If medication has been prescribed for you to prevent seizures, take it exactly as directed.  Do not stop taking the medicine without talking to your doctor first, even if you have not had a seizure in a long time.  ? ?2. Avoid activities in which a seizure would cause danger to yourself or to others.  Don't operate dangerous machinery, swim alone, or climb in high or dangerous places, such as on ladders, roofs, or girders.  Do not drive unless your doctor says you may. ? ?3. If you have any warning that you may have a seizure, lay down in a safe place where you can't hurt yourself.   ? ?4.  No driving for 6 months from last seizure, as per [facility].   Please refer to the following link on the [place] website for more information: http://www.epilepsyfoundation.org/answerplace/Social/driving/drivingu.cfm  ? ?5.  Maintain good sleep hygiene. Avoid alcohol. ? ?6.  Contact your doctor if you have any problems that may be related to the medicine you are taking. ? ?7.  Call 911 and bring the patient back to the ED if: ?      ? A.  The seizure lasts longer than 5 minutes.      ? B.  The patient doesn't awaken shortly after the seizure ? C.  The patient has new problems such as difficulty seeing, speaking or moving ? D.  The patient was injured during the seizure ? E.  The patient has a temperature over 102 F (39C) ? F.  The patient vomited and now is having trouble breathing ?      ? ?
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
## 2021-11-15 DIAGNOSIS — R531 Weakness: Secondary | ICD-10-CM | POA: Diagnosis not present

## 2021-11-15 DIAGNOSIS — M25612 Stiffness of left shoulder, not elsewhere classified: Secondary | ICD-10-CM | POA: Diagnosis not present

## 2021-11-15 DIAGNOSIS — M25512 Pain in left shoulder: Secondary | ICD-10-CM | POA: Diagnosis not present

## 2021-11-15 DIAGNOSIS — S46012D Strain of muscle(s) and tendon(s) of the rotator cuff of left shoulder, subsequent encounter: Secondary | ICD-10-CM | POA: Diagnosis not present

## 2021-11-16 ENCOUNTER — Ambulatory Visit (INDEPENDENT_AMBULATORY_CARE_PROVIDER_SITE_OTHER): Payer: Medicare Other | Admitting: Neurology

## 2021-11-16 DIAGNOSIS — G40009 Localization-related (focal) (partial) idiopathic epilepsy and epileptic syndromes with seizures of localized onset, not intractable, without status epilepticus: Secondary | ICD-10-CM

## 2021-11-16 DIAGNOSIS — D329 Benign neoplasm of meninges, unspecified: Secondary | ICD-10-CM

## 2021-11-17 DIAGNOSIS — M25612 Stiffness of left shoulder, not elsewhere classified: Secondary | ICD-10-CM | POA: Diagnosis not present

## 2021-11-17 DIAGNOSIS — R531 Weakness: Secondary | ICD-10-CM | POA: Diagnosis not present

## 2021-11-17 DIAGNOSIS — S46012D Strain of muscle(s) and tendon(s) of the rotator cuff of left shoulder, subsequent encounter: Secondary | ICD-10-CM | POA: Diagnosis not present

## 2021-11-17 DIAGNOSIS — M25512 Pain in left shoulder: Secondary | ICD-10-CM | POA: Diagnosis not present

## 2021-11-22 DIAGNOSIS — M25512 Pain in left shoulder: Secondary | ICD-10-CM | POA: Diagnosis not present

## 2021-11-22 DIAGNOSIS — S46012D Strain of muscle(s) and tendon(s) of the rotator cuff of left shoulder, subsequent encounter: Secondary | ICD-10-CM | POA: Diagnosis not present

## 2021-11-22 DIAGNOSIS — M25612 Stiffness of left shoulder, not elsewhere classified: Secondary | ICD-10-CM | POA: Diagnosis not present

## 2021-11-22 DIAGNOSIS — R531 Weakness: Secondary | ICD-10-CM | POA: Diagnosis not present

## 2021-11-23 NOTE — Procedures (Signed)
ELECTROENCEPHALOGRAM REPORT ? ?Date of Study: 11/16/2021 ? ?Patient's Name: Kathy Scott ?MRN: [id number] ?Date of Birth: [birth year] ? ?Referring Provider: Dr. Ellouise Newer ? ?Clinical History: This is a 71 year old woman with a history of seizures, seizure-free for 10 years until a different type of seizure in 05/2021. EEG for classification. ? ?Medications: ?KEPPRA XR 500 MG 24 hr tablet ?ZYLOPRIM 300 MG tablet ?NORVASC 10 MG tablet ?OSCAL WITH D 500-200 MG-UNIT per tablet ?VITAMIN D3 1000 units CAPS ?Magnesium 500 MG CAPS ?TOPROL-XL 100 MG 24 hr tablet ?MULTIVITAMIN ADULT PO ?PRILOSEC 40 MG capsule ?DELTASONE 5 MG tablet ?SYNTHROID 50 MCG tablet ?DIOVAN 320 MG tablet ?CYANOCOBALAMIN 500 MCG tablet ?LASIX 40 MG tablet ? ?Technical Summary: ?A multichannel digital 1-hour EEG recording measured by the international 10-20 system with electrodes applied with paste and impedances below 5000 ohms performed in our laboratory with EKG monitoring in an awake and asleep patient.  Hyperventilation and photic stimulation were performed.  The digital EEG was referentially recorded, reformatted, and digitally filtered in a variety of bipolar and referential montages for optimal display.   ? ?Description: ?The patient is awake and asleep during the recording.  During maximal wakefulness, there is a symmetric, medium to high voltage 8-9 Hz posterior dominant rhythm that attenuates with eye opening.  The record is symmetric.  During drowsiness and sleep, there is an increase in theta slowing of the background, at times sharply contoured over the bilateral temporal regions without clear epileptogenic potential.  Vertex waves and symmetric sleep spindles were seen. Hyperventilation and photic stimulation did not elicit any abnormalities.  There were no epileptiform discharges or electrographic seizures seen.   ? ?EKG lead was unremarkable. ? ?Impression: ?This 1-hour awake and asleep EEG is within normal limits. ? ?Clinical  Correlation: ?A normal EEG does not exclude a clinical diagnosis of epilepsy.  If further clinical questions remain, prolonged EEG may be helpful.  Clinical correlation is advised. ? ? ?Ellouise Newer, M.D. ? ?

## 2021-11-24 ENCOUNTER — Ambulatory Visit
Admission: RE | Admit: 2021-11-24 | Discharge: 2021-11-24 | Disposition: A | Discharge status: Home / Self Care | Payer: Medicare Other | Source: Ambulatory Visit | Attending: Neurology | Admitting: Neurology

## 2021-11-24 DIAGNOSIS — G40009 Localization-related (focal) (partial) idiopathic epilepsy and epileptic syndromes with seizures of localized onset, not intractable, without status epilepticus: Secondary | ICD-10-CM

## 2021-11-24 DIAGNOSIS — S46012D Strain of muscle(s) and tendon(s) of the rotator cuff of left shoulder, subsequent encounter: Secondary | ICD-10-CM | POA: Diagnosis not present

## 2021-11-24 DIAGNOSIS — M25512 Pain in left shoulder: Secondary | ICD-10-CM | POA: Diagnosis not present

## 2021-11-24 DIAGNOSIS — R569 Unspecified convulsions: Secondary | ICD-10-CM | POA: Diagnosis not present

## 2021-11-24 DIAGNOSIS — D329 Benign neoplasm of meninges, unspecified: Secondary | ICD-10-CM

## 2021-11-24 DIAGNOSIS — R531 Weakness: Secondary | ICD-10-CM | POA: Diagnosis not present

## 2021-11-24 DIAGNOSIS — G319 Degenerative disease of nervous system, unspecified: Secondary | ICD-10-CM | POA: Diagnosis not present

## 2021-11-24 DIAGNOSIS — M25612 Stiffness of left shoulder, not elsewhere classified: Secondary | ICD-10-CM | POA: Diagnosis not present

## 2021-11-24 IMAGING — MR MR HEAD WO/W CM
10 of 11 series · 36 of 48 positions shown · IV contrast (multihance)
Comparison: MRI head [DATE].

CLINICAL DATA: seizure

EXAM:
MRI HEAD WITHOUT AND WITH CONTRAST
TECHNIQUE: Multiplanar, multiecho pulse sequences of the brain and surrounding
structures were obtained without and with intravenous contrast.
CONTRAST:  14mL MULTIHANCE GADOBENATE DIMEGLUMINE 529 MG/ML IV SOLN

[Series 2: T1 · sagittal · 5.0mm · 0.45mm/px · 1 of 24 slices shown]
[im 1/24]
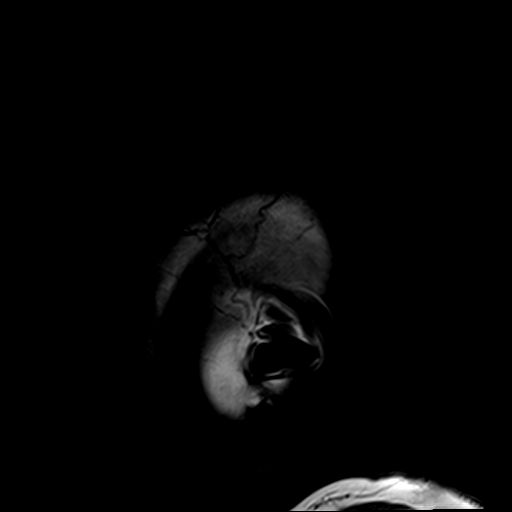

[Series 3: DWI · axial · 3.0mm · 1.80mm/px · z∈[-77,+70]mm · 9 of 100 slices shown (1 of 2)]
[im 1/100]
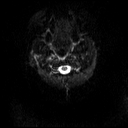
[im 13/100]
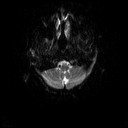
[im 25/100]
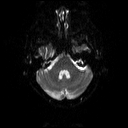
[im 38/100]
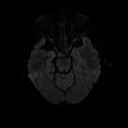
[im 50/100]
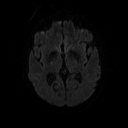
[im 62/100]
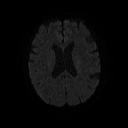
[im 75/100]
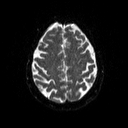
[im 87/100]
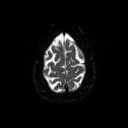
[im 100/100]
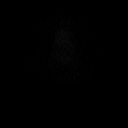

[Series 4: DWI · axial · 3.0mm · 1.80mm/px · z∈[-77,+70]mm · 5 of 50 slices shown (2 of 2)]
[im 1/50]
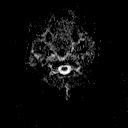
[im 13/50]
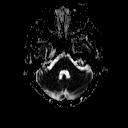
[im 25/50]
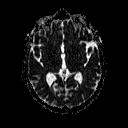
[im 37/50]
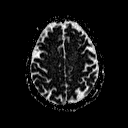
[im 50/50]
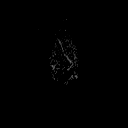

[Series 5: T2 · axial · 5.0mm · 0.51mm/px · z∈[-64,+78]mm · 2 of 22 slices shown (1 of 3)]
[im 1/22]
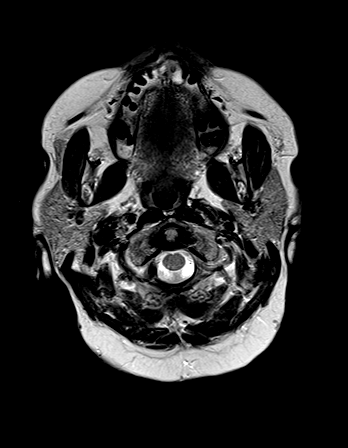
[im 22/22]
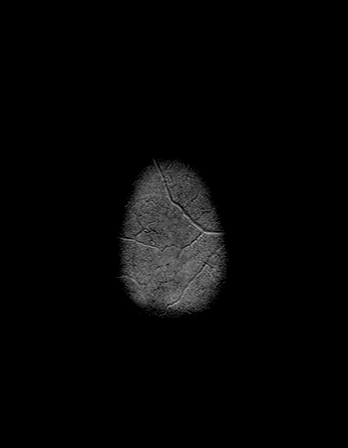

[Series 6: FLAIR · axial · 3.0mm · 0.45mm/px · z∈[-61,+83]mm · 3 of 32 slices shown (1 of 2)]
[im 1/32]
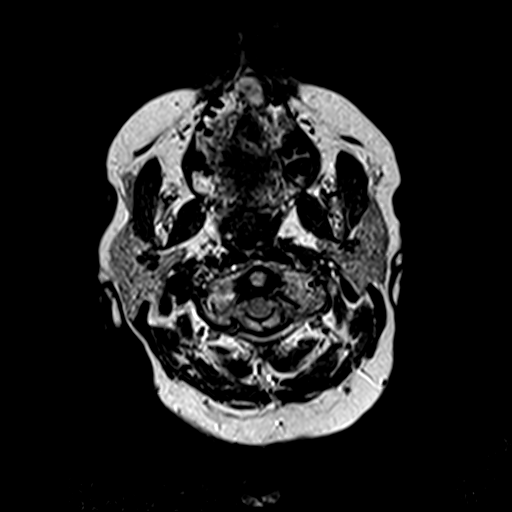
[im 16/32]
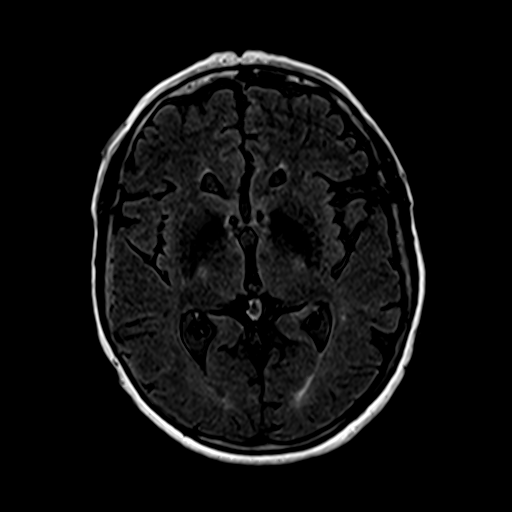
[im 32/32]
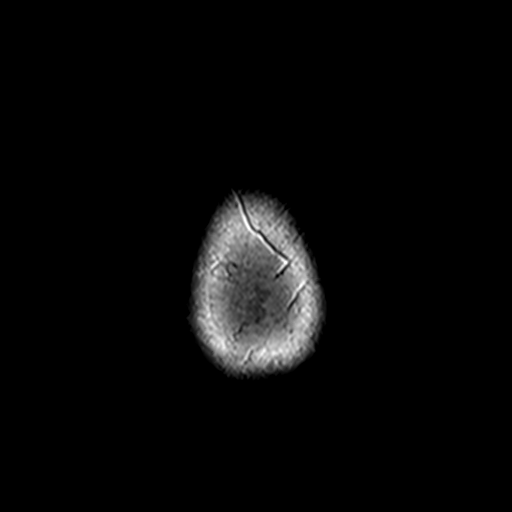

[Series 8: swi_images · axial · 4.0mm · 0.90mm/px · z∈[-57,+83]mm · 3 of 36 slices shown]
[im 1/36]
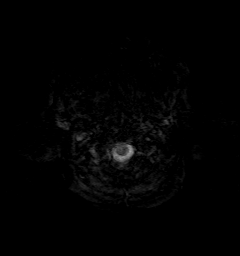
[im 18/36]
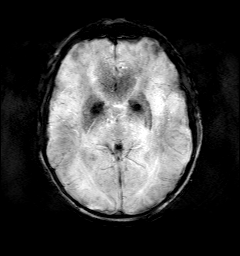
[im 36/36]
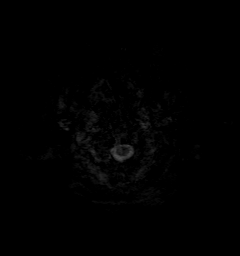

[Series 9: t1_mpr_tra · axial · 2.0mm · 0.45mm/px · z∈[-68,-2]mm · 4 of 80 slices shown]
[im 1/80]
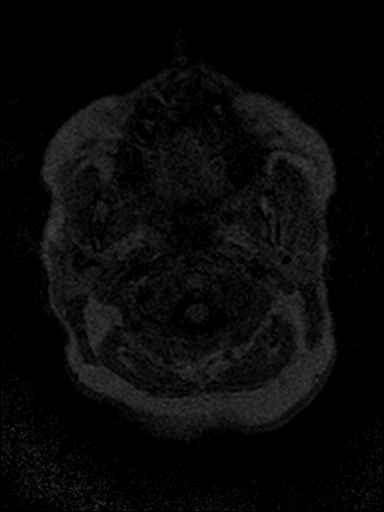
[im 12/80]
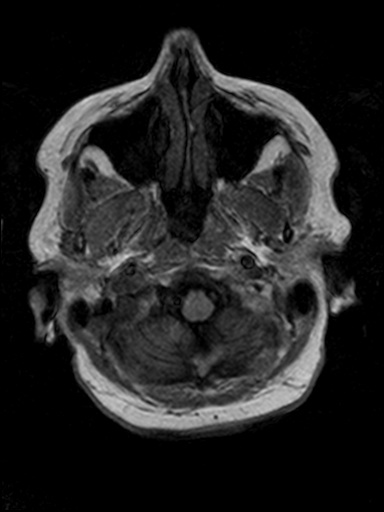
[im 23/80]
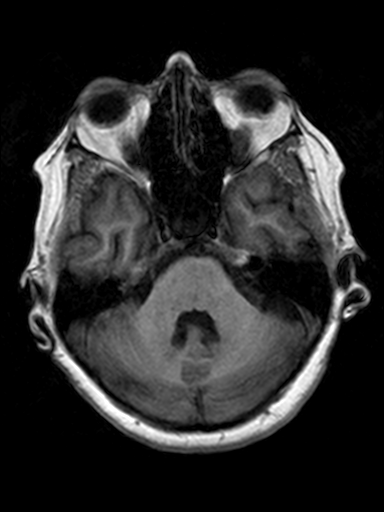
[im 34/80]
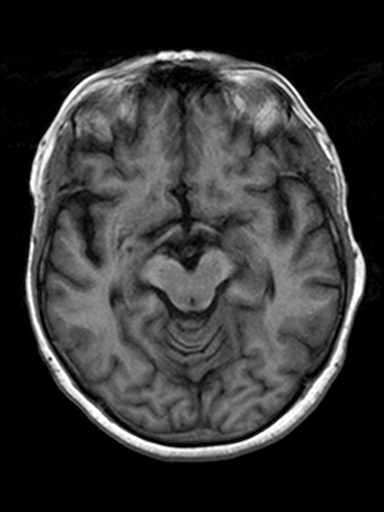

[Series 10: T2 · coronal · 3.0mm · 0.23mm/px · 3 of 28 slices shown (2 of 3)]
[im 1/28]
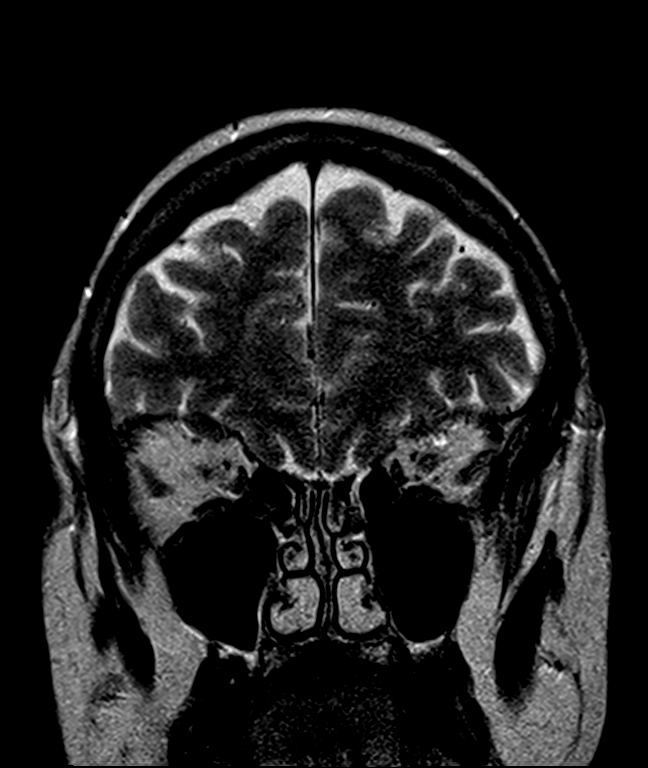
[im 14/28]
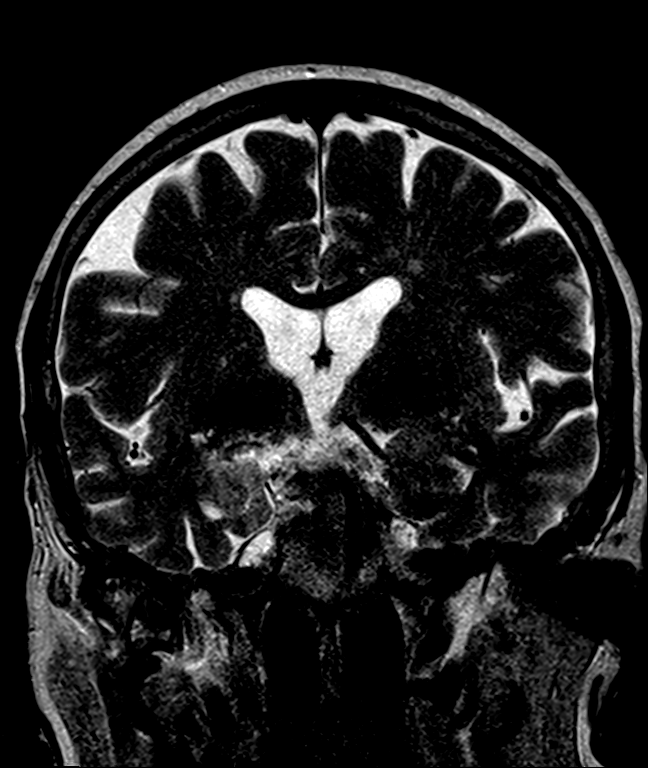
[im 28/28]
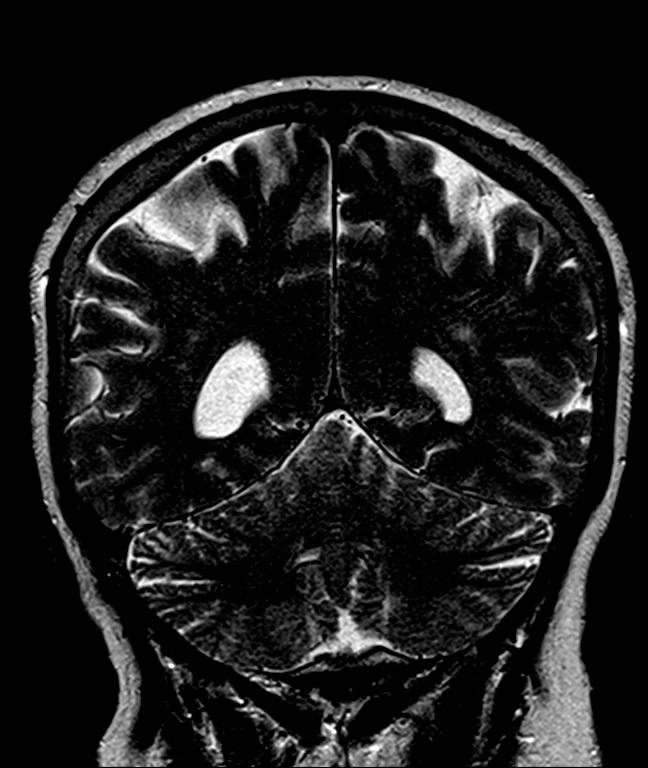

[Series 11: FLAIR · coronal · 3.0mm · 0.70mm/px · 3 of 28 slices shown (2 of 2)]
[im 1/28]
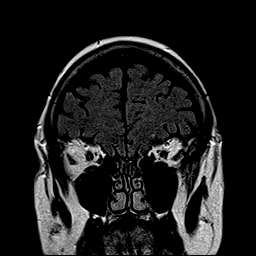
[im 14/28]
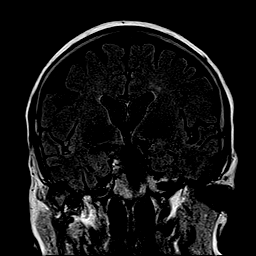
[im 28/28]
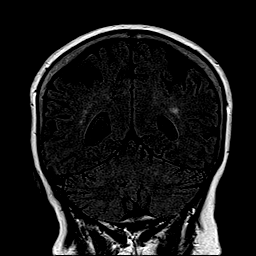

[Series 12: T2 · coronal · 5.0mm · 0.45mm/px · 3 of 27 slices shown (3 of 3)]
[im 1/27]
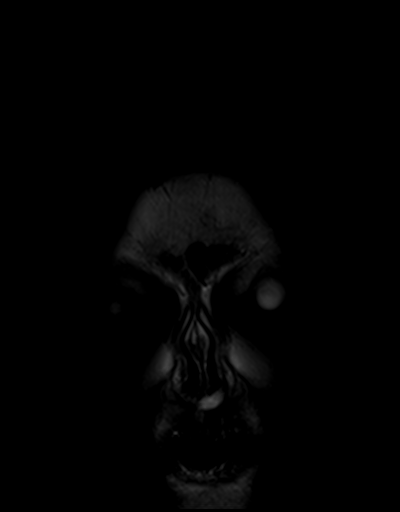
[im 14/27]
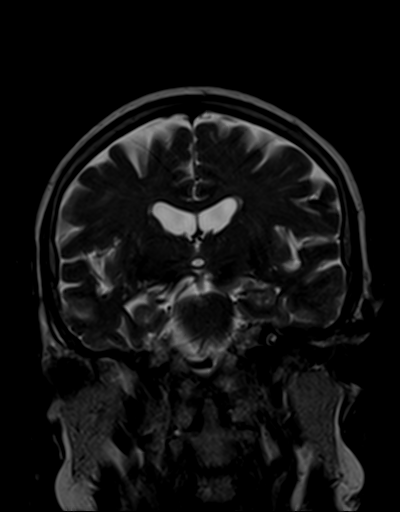
[im 27/27]
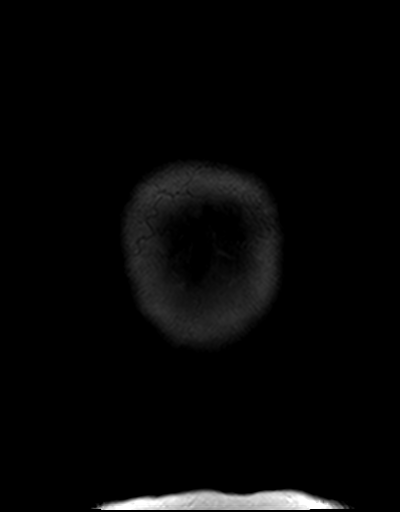

[36 of 48 positions shown; findings below may reference images not displayed]

FINDINGS: Brain: No acute infarction, hemorrhage, hydrocephalus, extra-axial
collection. Chronic microvascular ischemic disease and atrophy,
appropriate for patient age. Similar 7 mm presumed falcine
meningioma, homogeneously enhancing and extra-axial/dural-based. No
other pathologic enhancement.

Vascular: Major arterial flow voids are maintained at the skull
base.

Skull and upper cervical spine: Normal marrow signal.

Sinuses/Orbits: Clear sinuses.  No acute orbital findings.

Other: No mastoid effusions.
IMPRESSION: 1. No acute intracranial abnormality.
2. Similar 7 mm presumed falcine meningioma.

## 2021-11-24 MED ORDER — GADOBENATE DIMEGLUMINE 529 MG/ML IV SOLN
14.0000 mL | Freq: Once | INTRAVENOUS | Status: AC | PRN
  Administered 2021-11-24: 14 mL via INTRAVENOUS

## 2022-01-12 DIAGNOSIS — M109 Gout, unspecified: Secondary | ICD-10-CM | POA: Diagnosis not present

## 2022-01-12 DIAGNOSIS — M810 Age-related osteoporosis without current pathological fracture: Secondary | ICD-10-CM | POA: Diagnosis not present

## 2022-01-12 DIAGNOSIS — Z79899 Other long term (current) drug therapy: Secondary | ICD-10-CM | POA: Diagnosis not present

## 2022-01-12 DIAGNOSIS — M1990 Unspecified osteoarthritis, unspecified site: Secondary | ICD-10-CM | POA: Diagnosis not present

## 2022-01-12 DIAGNOSIS — E039 Hypothyroidism, unspecified: Secondary | ICD-10-CM | POA: Diagnosis not present

## 2022-02-17 ENCOUNTER — Encounter: Payer: Self-pay | Admitting: Neurology

## 2022-02-17 ENCOUNTER — Ambulatory Visit (INDEPENDENT_AMBULATORY_CARE_PROVIDER_SITE_OTHER): Payer: Medicare Other | Admitting: Neurology

## 2022-02-17 VITALS — BP 136/77 | HR 66 | Ht <= 58 in | Wt 145.0 lb

## 2022-02-17 DIAGNOSIS — G40009 Localization-related (focal) (partial) idiopathic epilepsy and epileptic syndromes with seizures of localized onset, not intractable, without status epilepticus: Secondary | ICD-10-CM

## 2022-02-17 DIAGNOSIS — D329 Benign neoplasm of meninges, unspecified: Secondary | ICD-10-CM

## 2022-02-17 MED ORDER — LEVETIRACETAM ER 500 MG PO TB24: 2000.0000 mg | ORAL_TABLET | Freq: Every day | ORAL | 4 refills | Status: DC
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
## 2022-03-07 ENCOUNTER — Telehealth: Payer: Medicare Other | Admitting: Family Medicine

## 2022-04-12 DIAGNOSIS — H2513 Age-related nuclear cataract, bilateral: Secondary | ICD-10-CM | POA: Diagnosis not present

## 2022-05-02 DIAGNOSIS — M810 Age-related osteoporosis without current pathological fracture: Secondary | ICD-10-CM | POA: Diagnosis not present

## 2022-05-02 DIAGNOSIS — I5030 Unspecified diastolic (congestive) heart failure: Secondary | ICD-10-CM | POA: Diagnosis not present

## 2022-05-02 DIAGNOSIS — Z0000 Encounter for general adult medical examination without abnormal findings: Secondary | ICD-10-CM | POA: Diagnosis not present

## 2022-05-02 DIAGNOSIS — M109 Gout, unspecified: Secondary | ICD-10-CM | POA: Diagnosis not present

## 2022-05-02 DIAGNOSIS — D320 Benign neoplasm of cerebral meninges: Secondary | ICD-10-CM | POA: Diagnosis not present

## 2022-05-02 DIAGNOSIS — Z79899 Other long term (current) drug therapy: Secondary | ICD-10-CM | POA: Diagnosis not present

## 2022-05-02 DIAGNOSIS — K219 Gastro-esophageal reflux disease without esophagitis: Secondary | ICD-10-CM | POA: Diagnosis not present

## 2022-05-02 DIAGNOSIS — E039 Hypothyroidism, unspecified: Secondary | ICD-10-CM | POA: Diagnosis not present

## 2022-05-02 DIAGNOSIS — I10 Essential (primary) hypertension: Secondary | ICD-10-CM | POA: Diagnosis not present

## 2022-05-03 ENCOUNTER — Other Ambulatory Visit: Payer: Self-pay | Admitting: Family Medicine

## 2022-05-03 DIAGNOSIS — Z1231 Encounter for screening mammogram for malignant neoplasm of breast: Secondary | ICD-10-CM

## 2022-05-03 DIAGNOSIS — Z13820 Encounter for screening for osteoporosis: Secondary | ICD-10-CM

## 2022-05-30 ENCOUNTER — Ambulatory Visit
Admission: RE | Admit: 2022-05-30 | Discharge: 2022-05-30 | Disposition: A | Discharge status: Home / Self Care | Payer: Medicare Other | Source: Ambulatory Visit | Attending: Family Medicine | Admitting: Family Medicine

## 2022-05-30 DIAGNOSIS — Z1231 Encounter for screening mammogram for malignant neoplasm of breast: Secondary | ICD-10-CM

## 2022-05-30 DIAGNOSIS — Z13820 Encounter for screening for osteoporosis: Secondary | ICD-10-CM

## 2022-05-30 DIAGNOSIS — Z780 Asymptomatic menopausal state: Secondary | ICD-10-CM | POA: Diagnosis not present

## 2022-05-30 DIAGNOSIS — M810 Age-related osteoporosis without current pathological fracture: Secondary | ICD-10-CM | POA: Diagnosis not present

## 2022-07-06 DIAGNOSIS — E039 Hypothyroidism, unspecified: Secondary | ICD-10-CM | POA: Diagnosis not present

## 2022-07-06 DIAGNOSIS — E559 Vitamin D deficiency, unspecified: Secondary | ICD-10-CM | POA: Diagnosis not present

## 2022-07-06 DIAGNOSIS — M810 Age-related osteoporosis without current pathological fracture: Secondary | ICD-10-CM | POA: Diagnosis not present

## 2022-07-06 DIAGNOSIS — Z7952 Long term (current) use of systemic steroids: Secondary | ICD-10-CM | POA: Diagnosis not present

## 2022-07-06 DIAGNOSIS — I10 Essential (primary) hypertension: Secondary | ICD-10-CM | POA: Diagnosis not present

## 2022-08-08 DIAGNOSIS — I10 Essential (primary) hypertension: Secondary | ICD-10-CM | POA: Diagnosis not present

## 2022-08-08 DIAGNOSIS — D320 Benign neoplasm of cerebral meninges: Secondary | ICD-10-CM | POA: Diagnosis not present

## 2022-08-08 DIAGNOSIS — I5030 Unspecified diastolic (congestive) heart failure: Secondary | ICD-10-CM | POA: Diagnosis not present

## 2022-08-16 DIAGNOSIS — M25569 Pain in unspecified knee: Secondary | ICD-10-CM | POA: Diagnosis not present

## 2022-08-16 DIAGNOSIS — M109 Gout, unspecified: Secondary | ICD-10-CM | POA: Diagnosis not present

## 2022-08-16 DIAGNOSIS — M810 Age-related osteoporosis without current pathological fracture: Secondary | ICD-10-CM | POA: Diagnosis not present

## 2022-08-16 DIAGNOSIS — M1990 Unspecified osteoarthritis, unspecified site: Secondary | ICD-10-CM | POA: Diagnosis not present

## 2022-08-16 DIAGNOSIS — E039 Hypothyroidism, unspecified: Secondary | ICD-10-CM | POA: Diagnosis not present

## 2022-08-16 DIAGNOSIS — Z79899 Other long term (current) drug therapy: Secondary | ICD-10-CM | POA: Diagnosis not present

## 2022-08-22 DIAGNOSIS — M62551 Muscle wasting and atrophy, not elsewhere classified, right thigh: Secondary | ICD-10-CM | POA: Diagnosis not present

## 2022-08-22 DIAGNOSIS — M25661 Stiffness of right knee, not elsewhere classified: Secondary | ICD-10-CM | POA: Diagnosis not present

## 2022-08-22 DIAGNOSIS — M25561 Pain in right knee: Secondary | ICD-10-CM | POA: Diagnosis not present

## 2022-08-22 DIAGNOSIS — Z7409 Other reduced mobility: Secondary | ICD-10-CM | POA: Diagnosis not present

## 2022-08-29 DIAGNOSIS — M25661 Stiffness of right knee, not elsewhere classified: Secondary | ICD-10-CM | POA: Diagnosis not present

## 2022-08-29 DIAGNOSIS — M25561 Pain in right knee: Secondary | ICD-10-CM | POA: Diagnosis not present

## 2022-08-29 DIAGNOSIS — M62551 Muscle wasting and atrophy, not elsewhere classified, right thigh: Secondary | ICD-10-CM | POA: Diagnosis not present

## 2022-08-29 DIAGNOSIS — Z7409 Other reduced mobility: Secondary | ICD-10-CM | POA: Diagnosis not present

## 2022-08-31 DIAGNOSIS — Z7409 Other reduced mobility: Secondary | ICD-10-CM | POA: Diagnosis not present

## 2022-08-31 DIAGNOSIS — M25661 Stiffness of right knee, not elsewhere classified: Secondary | ICD-10-CM | POA: Diagnosis not present

## 2022-08-31 DIAGNOSIS — M62551 Muscle wasting and atrophy, not elsewhere classified, right thigh: Secondary | ICD-10-CM | POA: Diagnosis not present

## 2022-08-31 DIAGNOSIS — M25561 Pain in right knee: Secondary | ICD-10-CM | POA: Diagnosis not present

## 2022-09-05 DIAGNOSIS — M25661 Stiffness of right knee, not elsewhere classified: Secondary | ICD-10-CM | POA: Diagnosis not present

## 2022-09-05 DIAGNOSIS — Z7409 Other reduced mobility: Secondary | ICD-10-CM | POA: Diagnosis not present

## 2022-09-05 DIAGNOSIS — M25561 Pain in right knee: Secondary | ICD-10-CM | POA: Diagnosis not present

## 2022-09-05 DIAGNOSIS — M62551 Muscle wasting and atrophy, not elsewhere classified, right thigh: Secondary | ICD-10-CM | POA: Diagnosis not present

## 2022-09-07 DIAGNOSIS — M62551 Muscle wasting and atrophy, not elsewhere classified, right thigh: Secondary | ICD-10-CM | POA: Diagnosis not present

## 2022-09-07 DIAGNOSIS — M25561 Pain in right knee: Secondary | ICD-10-CM | POA: Diagnosis not present

## 2022-09-07 DIAGNOSIS — M25661 Stiffness of right knee, not elsewhere classified: Secondary | ICD-10-CM | POA: Diagnosis not present

## 2022-09-07 DIAGNOSIS — Z7409 Other reduced mobility: Secondary | ICD-10-CM | POA: Diagnosis not present

## 2022-09-12 DIAGNOSIS — M25661 Stiffness of right knee, not elsewhere classified: Secondary | ICD-10-CM | POA: Diagnosis not present

## 2022-09-12 DIAGNOSIS — Z7409 Other reduced mobility: Secondary | ICD-10-CM | POA: Diagnosis not present

## 2022-09-12 DIAGNOSIS — M62551 Muscle wasting and atrophy, not elsewhere classified, right thigh: Secondary | ICD-10-CM | POA: Diagnosis not present

## 2022-09-12 DIAGNOSIS — M25561 Pain in right knee: Secondary | ICD-10-CM | POA: Diagnosis not present

## 2022-09-13 DIAGNOSIS — M278 Other specified diseases of jaws: Secondary | ICD-10-CM | POA: Diagnosis not present

## 2022-09-13 DIAGNOSIS — K122 Cellulitis and abscess of mouth: Secondary | ICD-10-CM | POA: Diagnosis not present

## 2022-09-13 HISTORY — PX: DENTAL SURGERY: SHX609

## 2022-09-18 ENCOUNTER — Ambulatory Visit: Payer: Medicare Other | Admitting: Neurology

## 2022-10-02 ENCOUNTER — Encounter: Payer: Self-pay | Admitting: Neurology

## 2022-10-02 ENCOUNTER — Ambulatory Visit: Payer: Medicare Other | Admitting: Neurology

## 2022-10-02 VITALS — BP 147/80 | HR 66 | Ht <= 58 in | Wt 143.4 lb

## 2022-10-02 DIAGNOSIS — G40009 Localization-related (focal) (partial) idiopathic epilepsy and epileptic syndromes with seizures of localized onset, not intractable, without status epilepticus: Secondary | ICD-10-CM

## 2022-10-02 MED ORDER — LEVETIRACETAM ER 500 MG PO TB24: 2000.0000 mg | ORAL_TABLET | Freq: Every day | ORAL | 4 refills | Status: DC

## 2022-10-02 NOTE — Patient Instructions (Signed)
Good to see you doing well. Continue Keppra XR 500mg : take 4 tablets every night. Follow-up in 6-8 months, call for any changes.    Seizure Precautions: 1. If medication has been prescribed for you to prevent seizures, take it exactly as directed.  Do not stop taking the medicine without talking to your doctor first, even if you have not had a seizure in a long time.   2. Avoid activities in which a seizure would cause danger to yourself or to others.  Don't operate dangerous machinery, swim alone, or climb in high or dangerous places, such as on ladders, roofs, or girders.  Do not drive unless your doctor says you may.  3. If you have any warning that you may have a seizure, lay down in a safe place where you can't hurt yourself.    4.  No driving for 6 months from last seizure, as per [facility].   Please refer to the following link on the [place] website for more information: http://www.epilepsyfoundation.org/answerplace/Social/driving/drivingu.cfm   5.  Maintain good sleep hygiene. Avoid alcohol.  6.  Contact your doctor if you have any problems that may be related to the medicine you are taking.  7.  Call 911 and bring the patient back to the ED if:        A.  The seizure lasts longer than 5 minutes.       B.  The patient doesn't awaken shortly after the seizure  C.  The patient has new problems such as difficulty seeing, speaking or moving  D.  The patient was injured during the seizure  E.  The patient has a temperature over 102 F (39C)  F.  The patient vomited and now is having trouble breathing
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
## 2022-11-07 DIAGNOSIS — K219 Gastro-esophageal reflux disease without esophagitis: Secondary | ICD-10-CM | POA: Diagnosis not present

## 2022-11-07 DIAGNOSIS — M810 Age-related osteoporosis without current pathological fracture: Secondary | ICD-10-CM | POA: Diagnosis not present

## 2022-11-07 DIAGNOSIS — I10 Essential (primary) hypertension: Secondary | ICD-10-CM | POA: Diagnosis not present

## 2022-11-07 DIAGNOSIS — E039 Hypothyroidism, unspecified: Secondary | ICD-10-CM | POA: Diagnosis not present

## 2022-11-07 DIAGNOSIS — M109 Gout, unspecified: Secondary | ICD-10-CM | POA: Diagnosis not present

## 2022-11-07 DIAGNOSIS — I5030 Unspecified diastolic (congestive) heart failure: Secondary | ICD-10-CM | POA: Diagnosis not present

## 2023-02-14 DIAGNOSIS — E039 Hypothyroidism, unspecified: Secondary | ICD-10-CM | POA: Diagnosis not present

## 2023-02-14 DIAGNOSIS — M109 Gout, unspecified: Secondary | ICD-10-CM | POA: Diagnosis not present

## 2023-02-14 DIAGNOSIS — M810 Age-related osteoporosis without current pathological fracture: Secondary | ICD-10-CM | POA: Diagnosis not present

## 2023-02-14 DIAGNOSIS — Z79899 Other long term (current) drug therapy: Secondary | ICD-10-CM | POA: Diagnosis not present

## 2023-02-14 DIAGNOSIS — M1990 Unspecified osteoarthritis, unspecified site: Secondary | ICD-10-CM | POA: Diagnosis not present

## 2023-02-14 DIAGNOSIS — M12812 Other specific arthropathies, not elsewhere classified, left shoulder: Secondary | ICD-10-CM | POA: Diagnosis not present

## 2023-04-04 ENCOUNTER — Ambulatory Visit: Payer: Medicare Other | Admitting: Neurology

## 2023-04-11 DIAGNOSIS — I5030 Unspecified diastolic (congestive) heart failure: Secondary | ICD-10-CM | POA: Diagnosis not present

## 2023-04-11 DIAGNOSIS — I110 Hypertensive heart disease with heart failure: Secondary | ICD-10-CM | POA: Diagnosis not present

## 2023-04-16 ENCOUNTER — Encounter: Payer: Self-pay | Admitting: Neurology

## 2023-04-16 ENCOUNTER — Ambulatory Visit: Payer: Medicare Other | Admitting: Neurology

## 2023-04-16 VITALS — BP 172/79 | HR 97 | Ht <= 58 in | Wt 148.2 lb

## 2023-04-16 DIAGNOSIS — G40009 Localization-related (focal) (partial) idiopathic epilepsy and epileptic syndromes with seizures of localized onset, not intractable, without status epilepticus: Secondary | ICD-10-CM | POA: Diagnosis not present

## 2023-04-16 DIAGNOSIS — D329 Benign neoplasm of meninges, unspecified: Secondary | ICD-10-CM
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
## 2023-04-16 NOTE — Patient Instructions (Signed)
Always good to see you.  Continue Keppra XR 500mg : Take 4 tablets every night  2. Contact our office when you are closer to needing refills  3. Continue to monitor BP with Dr. Cliffton Asters  4. Follow-up in 6 months, call for any changes   Seizure Precautions: 1. If medication has been prescribed for you to prevent seizures, take it exactly as directed.  Do not stop taking the medicine without talking to your doctor first, even if you have not had a seizure in a long time.   2. Avoid activities in which a seizure would cause danger to yourself or to others.  Don't operate dangerous machinery, swim alone, or climb in high or dangerous places, such as on ladders, roofs, or girders.  Do not drive unless your doctor says you may.  3. If you have any warning that you may have a seizure, lay down in a safe place where you can't hurt yourself.    4.  No driving for 6 months from last seizure, as per [facility].   Please refer to the following link on the Epilepsy Foundation of [employer]'s website for more information: http://www.epilepsyfoundation.org/answerplace/Social/driving/drivingu.cfm   5.  Maintain good sleep hygiene. Avoid alcohol.  6.  Contact your doctor if you have any problems that may be related to the medicine you are taking.  7.  Call 911 and bring the patient back to the ED if:        A.  The seizure lasts longer than 5 minutes.       B.  The patient doesn't awaken shortly after the seizure  C.  The patient has new problems such as difficulty seeing, speaking or moving  D.  The patient was injured during the seizure  E.  The patient has a temperature over 102 F (39C)  F.  The patient vomited and now is having trouble breathing

## 2023-04-30 ENCOUNTER — Other Ambulatory Visit: Payer: Self-pay | Admitting: Family Medicine

## 2023-04-30 DIAGNOSIS — Z1231 Encounter for screening mammogram for malignant neoplasm of breast: Secondary | ICD-10-CM

## 2023-05-23 DIAGNOSIS — E559 Vitamin D deficiency, unspecified: Secondary | ICD-10-CM | POA: Diagnosis not present

## 2023-05-23 DIAGNOSIS — Z79899 Other long term (current) drug therapy: Secondary | ICD-10-CM | POA: Diagnosis not present

## 2023-05-23 DIAGNOSIS — M109 Gout, unspecified: Secondary | ICD-10-CM | POA: Diagnosis not present

## 2023-05-23 DIAGNOSIS — I10 Essential (primary) hypertension: Secondary | ICD-10-CM | POA: Diagnosis not present

## 2023-05-23 DIAGNOSIS — K219 Gastro-esophageal reflux disease without esophagitis: Secondary | ICD-10-CM | POA: Diagnosis not present

## 2023-05-23 DIAGNOSIS — E785 Hyperlipidemia, unspecified: Secondary | ICD-10-CM | POA: Diagnosis not present

## 2023-05-23 DIAGNOSIS — I110 Hypertensive heart disease with heart failure: Secondary | ICD-10-CM | POA: Diagnosis not present

## 2023-05-23 DIAGNOSIS — E039 Hypothyroidism, unspecified: Secondary | ICD-10-CM | POA: Diagnosis not present

## 2023-05-23 DIAGNOSIS — M810 Age-related osteoporosis without current pathological fracture: Secondary | ICD-10-CM | POA: Diagnosis not present

## 2023-05-23 DIAGNOSIS — Z0000 Encounter for general adult medical examination without abnormal findings: Secondary | ICD-10-CM | POA: Diagnosis not present

## 2023-05-23 DIAGNOSIS — I5030 Unspecified diastolic (congestive) heart failure: Secondary | ICD-10-CM | POA: Diagnosis not present

## 2023-06-01 ENCOUNTER — Ambulatory Visit
Admission: RE | Admit: 2023-06-01 | Discharge: 2023-06-01 | Disposition: A | Discharge status: Home / Self Care | Payer: Medicare Other | Source: Ambulatory Visit | Attending: Family Medicine | Admitting: Family Medicine

## 2023-06-01 DIAGNOSIS — Z1231 Encounter for screening mammogram for malignant neoplasm of breast: Secondary | ICD-10-CM

## 2023-06-06 DIAGNOSIS — H2513 Age-related nuclear cataract, bilateral: Secondary | ICD-10-CM | POA: Diagnosis not present

## 2023-06-07 DIAGNOSIS — E876 Hypokalemia: Secondary | ICD-10-CM | POA: Diagnosis not present

## 2023-06-21 DIAGNOSIS — H903 Sensorineural hearing loss, bilateral: Secondary | ICD-10-CM | POA: Diagnosis not present

## 2023-08-15 DIAGNOSIS — M12812 Other specific arthropathies, not elsewhere classified, left shoulder: Secondary | ICD-10-CM | POA: Diagnosis not present

## 2023-08-15 DIAGNOSIS — M109 Gout, unspecified: Secondary | ICD-10-CM | POA: Diagnosis not present

## 2023-08-15 DIAGNOSIS — Z79899 Other long term (current) drug therapy: Secondary | ICD-10-CM | POA: Diagnosis not present

## 2023-08-15 DIAGNOSIS — E039 Hypothyroidism, unspecified: Secondary | ICD-10-CM | POA: Diagnosis not present

## 2023-08-15 DIAGNOSIS — M810 Age-related osteoporosis without current pathological fracture: Secondary | ICD-10-CM | POA: Diagnosis not present

## 2023-08-15 DIAGNOSIS — M1990 Unspecified osteoarthritis, unspecified site: Secondary | ICD-10-CM | POA: Diagnosis not present

## 2023-08-21 DIAGNOSIS — E559 Vitamin D deficiency, unspecified: Secondary | ICD-10-CM | POA: Diagnosis not present

## 2023-08-21 DIAGNOSIS — M810 Age-related osteoporosis without current pathological fracture: Secondary | ICD-10-CM | POA: Diagnosis not present

## 2023-08-21 DIAGNOSIS — E039 Hypothyroidism, unspecified: Secondary | ICD-10-CM | POA: Diagnosis not present

## 2023-08-21 DIAGNOSIS — I10 Essential (primary) hypertension: Secondary | ICD-10-CM | POA: Diagnosis not present

## 2023-08-30 DIAGNOSIS — M810 Age-related osteoporosis without current pathological fracture: Secondary | ICD-10-CM | POA: Diagnosis not present

## 2023-11-14 ENCOUNTER — Encounter: Payer: Self-pay | Admitting: Neurology

## 2023-11-14 ENCOUNTER — Ambulatory Visit: Payer: Medicare Other | Admitting: Neurology

## 2023-11-14 VITALS — BP 137/62 | HR 58 | Ht <= 58 in | Wt 147.4 lb

## 2023-11-14 DIAGNOSIS — G40009 Localization-related (focal) (partial) idiopathic epilepsy and epileptic syndromes with seizures of localized onset, not intractable, without status epilepticus: Secondary | ICD-10-CM

## 2023-11-14 MED ORDER — LEVETIRACETAM ER 500 MG PO TB24: 2000.0000 mg | ORAL_TABLET | Freq: Every day | ORAL | 4 refills | Status: AC

## 2023-11-14 NOTE — Patient Instructions (Signed)
 Always a pleasure to see you. Continue Levetiracetam  (Keppra ) ER 500mg : take 4 tablets every night. Follow-up in 1 year, call for any changes    Seizure Precautions: 1. If medication has been prescribed for you to prevent seizures, take it exactly as directed.  Do not stop taking the medicine without talking to your doctor first, even if you have not had a seizure in a long time.   2. Avoid activities in which a seizure would cause danger to yourself or to others.  Don't operate dangerous machinery, swim alone, or climb in high or dangerous places, such as on ladders, roofs, or girders.  Do not drive unless your doctor says you may.  3. If you have any warning that you may have a seizure, lay down in a safe place where you can't hurt yourself.    4.  No driving for 6 months from last seizure, as per [place]  state law.   Please refer to the following link on the [employer]ile[employer]'s website for more information: http://www.epilepsyfoundation.org/answerplace/Social/driving/drivingu.cfm   5.  Maintain good sleep hygiene. Avoid alcohol.  6.  Contact your doctor if you have any problems that may be related to the medicine you are taking.  7.  Call 911 and bring the patient back to the ED if:        A.  The seizure lasts longer than 5 minutes.       B.  The patient doesn't awaken shortly after the seizure  C.  The patient has new problems such as difficulty seeing, speaking or moving  D.  The patient was injured during the seizure  E.  The patient has a temperature over 102 F (39C)  F.  The patient vomited and now is having trouble breathing
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
## 2023-11-21 DIAGNOSIS — I10 Essential (primary) hypertension: Secondary | ICD-10-CM | POA: Diagnosis not present

## 2023-11-21 DIAGNOSIS — E039 Hypothyroidism, unspecified: Secondary | ICD-10-CM | POA: Diagnosis not present

## 2023-11-21 DIAGNOSIS — I5030 Unspecified diastolic (congestive) heart failure: Secondary | ICD-10-CM | POA: Diagnosis not present

## 2023-11-21 DIAGNOSIS — H539 Unspecified visual disturbance: Secondary | ICD-10-CM | POA: Diagnosis not present

## 2023-11-27 ENCOUNTER — Other Ambulatory Visit: Payer: Self-pay | Admitting: Neurology

## 2023-12-06 DIAGNOSIS — H5213 Myopia, bilateral: Secondary | ICD-10-CM | POA: Diagnosis not present

## 2023-12-06 DIAGNOSIS — H3581 Retinal edema: Secondary | ICD-10-CM | POA: Diagnosis not present

## 2023-12-06 DIAGNOSIS — H43813 Vitreous degeneration, bilateral: Secondary | ICD-10-CM | POA: Diagnosis not present

## 2023-12-06 DIAGNOSIS — H2513 Age-related nuclear cataract, bilateral: Secondary | ICD-10-CM | POA: Diagnosis not present

## 2023-12-06 DIAGNOSIS — H25013 Cortical age-related cataract, bilateral: Secondary | ICD-10-CM | POA: Diagnosis not present

## 2023-12-07 DIAGNOSIS — H43813 Vitreous degeneration, bilateral: Secondary | ICD-10-CM | POA: Diagnosis not present

## 2023-12-07 DIAGNOSIS — H34832 Tributary (branch) retinal vein occlusion, left eye, with macular edema: Secondary | ICD-10-CM | POA: Diagnosis not present

## 2023-12-07 DIAGNOSIS — H2513 Age-related nuclear cataract, bilateral: Secondary | ICD-10-CM | POA: Diagnosis not present

## 2024-02-29 DIAGNOSIS — M810 Age-related osteoporosis without current pathological fracture: Secondary | ICD-10-CM | POA: Diagnosis not present

## 2024-05-22 ENCOUNTER — Other Ambulatory Visit (HOSPITAL_BASED_OUTPATIENT_CLINIC_OR_DEPARTMENT_OTHER): Payer: Self-pay | Admitting: Family Medicine

## 2024-05-22 DIAGNOSIS — Z13820 Encounter for screening for osteoporosis: Secondary | ICD-10-CM

## 2024-05-23 ENCOUNTER — Other Ambulatory Visit: Payer: Self-pay | Admitting: Family Medicine

## 2024-05-23 DIAGNOSIS — Z1231 Encounter for screening mammogram for malignant neoplasm of breast: Secondary | ICD-10-CM

## 2024-06-03 ENCOUNTER — Encounter: Payer: Self-pay | Admitting: Neurology

## 2024-06-19 ENCOUNTER — Ambulatory Visit
Admission: RE | Admit: 2024-06-19 | Discharge: 2024-06-19 | Disposition: A | Source: Ambulatory Visit | Attending: Family Medicine | Admitting: Family Medicine

## 2024-06-19 DIAGNOSIS — Z1231 Encounter for screening mammogram for malignant neoplasm of breast: Secondary | ICD-10-CM

## 2024-07-03 ENCOUNTER — Inpatient Hospital Stay (HOSPITAL_BASED_OUTPATIENT_CLINIC_OR_DEPARTMENT_OTHER): Admission: RE | Admit: 2024-07-03 | Discharge: 2024-07-03 | Attending: Family Medicine | Admitting: Family Medicine

## 2024-07-03 DIAGNOSIS — M810 Age-related osteoporosis without current pathological fracture: Secondary | ICD-10-CM | POA: Insufficient documentation

## 2024-07-03 DIAGNOSIS — Z13820 Encounter for screening for osteoporosis: Secondary | ICD-10-CM | POA: Insufficient documentation

## 2024-10-01 ENCOUNTER — Institutional Professional Consult (permissible substitution) (HOSPITAL_BASED_OUTPATIENT_CLINIC_OR_DEPARTMENT_OTHER): Admitting: Cardiovascular Disease

## 2024-10-27 ENCOUNTER — Ambulatory Visit: Admitting: Neurology
